# Patient Record
Sex: Female | Born: 1940 | Race: White | Hispanic: No | Marital: Married | Smoking: Never smoker
Health system: Southern US, Community
[De-identification: ages and names within clinical notes are randomized; demographics above are authoritative.]

## PROBLEM LIST (undated history)

## (undated) DIAGNOSIS — C50912 Malignant neoplasm of unspecified site of left female breast: Secondary | ICD-10-CM

## (undated) DIAGNOSIS — I10 Essential (primary) hypertension: Secondary | ICD-10-CM

## (undated) DIAGNOSIS — J42 Unspecified chronic bronchitis: Secondary | ICD-10-CM

## (undated) DIAGNOSIS — K219 Gastro-esophageal reflux disease without esophagitis: Secondary | ICD-10-CM

## (undated) DIAGNOSIS — Z8711 Personal history of peptic ulcer disease: Secondary | ICD-10-CM

## (undated) DIAGNOSIS — R519 Headache, unspecified: Secondary | ICD-10-CM

## (undated) DIAGNOSIS — Z8719 Personal history of other diseases of the digestive system: Secondary | ICD-10-CM

## (undated) DIAGNOSIS — R413 Other amnesia: Secondary | ICD-10-CM

## (undated) DIAGNOSIS — R51 Headache: Secondary | ICD-10-CM

## (undated) DIAGNOSIS — J449 Chronic obstructive pulmonary disease, unspecified: Secondary | ICD-10-CM

## (undated) DIAGNOSIS — M1990 Unspecified osteoarthritis, unspecified site: Secondary | ICD-10-CM

## (undated) DIAGNOSIS — G43909 Migraine, unspecified, not intractable, without status migrainosus: Secondary | ICD-10-CM

## (undated) DIAGNOSIS — J189 Pneumonia, unspecified organism: Secondary | ICD-10-CM

## (undated) DIAGNOSIS — E079 Disorder of thyroid, unspecified: Secondary | ICD-10-CM

## (undated) HISTORY — PX: DILATION AND CURETTAGE OF UTERUS: SHX78

## (undated) HISTORY — PX: WRIST SURGERY: SHX841

## (undated) HISTORY — PX: BREAST LUMPECTOMY: SHX2

## (undated) HISTORY — PX: BACK SURGERY: SHX140

## (undated) HISTORY — PX: FEMUR FRACTURE SURGERY: SHX633

## (undated) HISTORY — PX: ABDOMINAL HYSTERECTOMY: SHX81

## (undated) HISTORY — PX: APPENDECTOMY: SHX54

## (undated) HISTORY — PX: JOINT REPLACEMENT: SHX530

## (undated) HISTORY — PX: TOTAL HIP ARTHROPLASTY: SHX124

## (undated) HISTORY — PX: TONSILLECTOMY: SUR1361

---

## 1999-05-22 ENCOUNTER — Other Ambulatory Visit: Admission: RE | Admit: 1999-05-22 | Discharge: 1999-05-22 | Payer: Self-pay | Admitting: *Deleted

## 2000-06-08 ENCOUNTER — Encounter: Admission: RE | Admit: 2000-06-08 | Discharge: 2000-07-29 | Payer: Self-pay | Admitting: Anesthesiology

## 2000-07-17 HISTORY — PX: TOTAL KNEE ARTHROPLASTY: SHX125

## 2000-07-28 ENCOUNTER — Encounter: Payer: Self-pay | Admitting: Orthopedic Surgery

## 2000-07-29 ENCOUNTER — Inpatient Hospital Stay (HOSPITAL_COMMUNITY): Admission: RE | Admit: 2000-07-29 | Discharge: 2000-08-03 | Payer: Self-pay | Admitting: Orthopedic Surgery

## 2000-07-29 ENCOUNTER — Encounter: Payer: Self-pay | Admitting: Anesthesiology

## 2000-08-03 ENCOUNTER — Inpatient Hospital Stay (HOSPITAL_COMMUNITY)
Admission: RE | Admit: 2000-08-03 | Discharge: 2000-08-10 | Payer: Self-pay | Admitting: Physical Medicine & Rehabilitation

## 2001-05-16 HISTORY — PX: LUMBAR FUSION: SHX111

## 2001-05-20 ENCOUNTER — Encounter: Payer: Self-pay | Admitting: Neurosurgery

## 2001-05-20 ENCOUNTER — Inpatient Hospital Stay (HOSPITAL_COMMUNITY): Admission: RE | Admit: 2001-05-20 | Discharge: 2001-05-25 | Payer: Self-pay | Admitting: Neurosurgery

## 2001-05-22 ENCOUNTER — Encounter: Payer: Self-pay | Admitting: Neurosurgery

## 2001-06-22 ENCOUNTER — Ambulatory Visit (HOSPITAL_COMMUNITY): Admission: RE | Admit: 2001-06-22 | Discharge: 2001-06-22 | Payer: Self-pay | Admitting: Neurosurgery

## 2001-06-22 ENCOUNTER — Encounter: Payer: Self-pay | Admitting: Neurosurgery

## 2001-08-26 ENCOUNTER — Ambulatory Visit (HOSPITAL_COMMUNITY): Admission: RE | Admit: 2001-08-26 | Discharge: 2001-08-26 | Payer: Self-pay | Admitting: Neurosurgery

## 2001-08-26 ENCOUNTER — Encounter: Payer: Self-pay | Admitting: Neurosurgery

## 2001-09-16 HISTORY — PX: KNEE ARTHROSCOPY: SHX127

## 2002-10-18 ENCOUNTER — Encounter: Payer: Self-pay | Admitting: Orthopedic Surgery

## 2002-10-19 ENCOUNTER — Inpatient Hospital Stay (HOSPITAL_COMMUNITY): Admission: RE | Admit: 2002-10-19 | Discharge: 2002-10-23 | Payer: Self-pay | Admitting: Orthopedic Surgery

## 2002-10-19 ENCOUNTER — Encounter (INDEPENDENT_AMBULATORY_CARE_PROVIDER_SITE_OTHER): Payer: Self-pay | Admitting: *Deleted

## 2002-12-19 ENCOUNTER — Inpatient Hospital Stay (HOSPITAL_COMMUNITY): Admission: RE | Admit: 2002-12-19 | Discharge: 2002-12-23 | Payer: Self-pay | Admitting: Orthopedic Surgery

## 2002-12-19 ENCOUNTER — Encounter (INDEPENDENT_AMBULATORY_CARE_PROVIDER_SITE_OTHER): Payer: Self-pay

## 2002-12-19 HISTORY — PX: I&D KNEE WITH POLY EXCHANGE: SHX5024

## 2002-12-25 ENCOUNTER — Encounter: Payer: Self-pay | Admitting: Internal Medicine

## 2002-12-25 ENCOUNTER — Ambulatory Visit (HOSPITAL_COMMUNITY): Admission: RE | Admit: 2002-12-25 | Discharge: 2002-12-25 | Payer: Self-pay | Admitting: Internal Medicine

## 2003-04-09 ENCOUNTER — Encounter: Payer: Self-pay | Admitting: Orthopedic Surgery

## 2003-04-17 ENCOUNTER — Inpatient Hospital Stay (HOSPITAL_COMMUNITY): Admission: RE | Admit: 2003-04-17 | Discharge: 2003-04-21 | Payer: Self-pay | Admitting: Orthopedic Surgery

## 2003-04-17 ENCOUNTER — Encounter: Payer: Self-pay | Admitting: Orthopedic Surgery

## 2003-04-17 ENCOUNTER — Encounter (INDEPENDENT_AMBULATORY_CARE_PROVIDER_SITE_OTHER): Payer: Self-pay | Admitting: Specialist

## 2003-04-17 HISTORY — PX: TOTAL HIP REVISION: SHX763

## 2003-04-18 ENCOUNTER — Encounter: Payer: Self-pay | Admitting: Orthopedic Surgery

## 2003-08-17 HISTORY — PX: TOTAL KNEE REVISION: SHX996

## 2003-09-10 ENCOUNTER — Encounter: Payer: Self-pay | Admitting: Orthopedic Surgery

## 2003-09-11 ENCOUNTER — Encounter (INDEPENDENT_AMBULATORY_CARE_PROVIDER_SITE_OTHER): Payer: Self-pay | Admitting: *Deleted

## 2003-09-11 ENCOUNTER — Inpatient Hospital Stay (HOSPITAL_COMMUNITY): Admission: RE | Admit: 2003-09-11 | Discharge: 2003-09-15 | Payer: Self-pay | Admitting: Orthopedic Surgery

## 2003-12-18 ENCOUNTER — Other Ambulatory Visit: Admission: RE | Admit: 2003-12-18 | Discharge: 2003-12-18 | Payer: Self-pay | Admitting: Gynecology

## 2003-12-22 ENCOUNTER — Other Ambulatory Visit: Admission: RE | Admit: 2003-12-22 | Discharge: 2003-12-22 | Payer: Self-pay | Admitting: Gynecology

## 2004-10-21 ENCOUNTER — Encounter: Admission: RE | Admit: 2004-10-21 | Discharge: 2004-10-21 | Payer: Self-pay | Admitting: Internal Medicine

## 2004-11-16 HISTORY — PX: LAPAROSCOPIC CHOLECYSTECTOMY: SUR755

## 2004-11-27 ENCOUNTER — Encounter: Admission: RE | Admit: 2004-11-27 | Discharge: 2004-11-27 | Payer: Self-pay | Admitting: Internal Medicine

## 2004-12-03 ENCOUNTER — Encounter (INDEPENDENT_AMBULATORY_CARE_PROVIDER_SITE_OTHER): Payer: Self-pay | Admitting: *Deleted

## 2004-12-03 ENCOUNTER — Ambulatory Visit (HOSPITAL_COMMUNITY): Admission: RE | Admit: 2004-12-03 | Discharge: 2004-12-04 | Payer: Self-pay

## 2005-10-06 ENCOUNTER — Inpatient Hospital Stay (HOSPITAL_COMMUNITY): Admission: EM | Admit: 2005-10-06 | Discharge: 2005-10-10 | Payer: Self-pay | Admitting: Emergency Medicine

## 2006-02-26 ENCOUNTER — Other Ambulatory Visit: Admission: RE | Admit: 2006-02-26 | Discharge: 2006-02-26 | Payer: Self-pay | Admitting: Gynecology

## 2007-07-18 ENCOUNTER — Emergency Department (HOSPITAL_COMMUNITY): Admission: EM | Admit: 2007-07-18 | Discharge: 2007-07-18 | Payer: Self-pay | Admitting: *Deleted

## 2008-04-11 ENCOUNTER — Ambulatory Visit (HOSPITAL_COMMUNITY): Admission: RE | Admit: 2008-04-11 | Discharge: 2008-04-11 | Payer: Self-pay | Admitting: Gastroenterology

## 2009-06-21 ENCOUNTER — Encounter: Admission: RE | Admit: 2009-06-21 | Discharge: 2009-06-21 | Payer: Self-pay | Admitting: Neurosurgery

## 2011-04-03 NOTE — Procedures (Signed)
[facility]  Patient:    [name]                      MRN: [id number] Proc. Date: 06/17/00 Adm. Date:  [id number] Attending:  Thyra Breed CC:         Jearld Adjutant, M.D.   Procedure Report  PROCEDURE:  Lumbar epidural steroid injection.  DIAGNOSIS:  Lumbar spinal stenosis on the basis of lumbar spondylosis and spondylolisthesis.  INTERVAL HISTORY:  The patients noted some mild improvement from her first injection. She has been quite active and feels as though she may have out stripped the benefits from the increased activity she has been doing. She tolerated the 40 mg without untoward effects.  PHYSICAL EXAMINATION:  VITAL SIGNS:  Blood pressure was 186/84, heart rate 86, respiratory rate 16, O2 saturations 96%, pain level is 9/10 and temperature is 97.8.  BACK:  Shows good healing from previous injection site.  DESCRIPTION OF PROCEDURE:  After informed consent was obtained, the patient was placed in the sitting position and monitored. The patients back was prepped with Betadine x 3. A skin wheal was raised at the L5-S1 interspace with 1 percent lidocaine. A 20 gauge Tuohy needle was introduced to the lumbar epidural space to loss of resistance to preservative free normal saline. There was no cerebrospinal fluid nor blood. 80 mg of Medrol and 10 ml of preservative free normal saline was gently injected. The needle was flushed with preservative free normal saline and removed intact.  CONDITION POST PROCEDURE:  Stable.  DISCHARGE INSTRUCTIONS:  Resume previous diet. Limitations in activities per instruction sheet. Continue on current medications. Follow-up with me in 1-2 weeks for a third injection if she notes more significant improvement. DD:  06/17/00 TD:  06/19/00 Job: [id number]

## 2011-04-03 NOTE — Op Note (Signed)
NAME:  Breanna, Black                        ACCOUNT NO.:  [id number]   MEDICAL RECORD NO.:  [id number]                   PATIENT TYPE:  INP   LOCATION:  2550                                 FACILITY:  [facility]   PHYSICIAN:  Deidre Ala, M.D.                 DATE OF BIRTH:  08-28-1941   DATE OF PROCEDURE:  09/11/2003  DATE OF DISCHARGE:                                 OPERATIVE REPORT   PREOPERATIVE DIAGNOSIS:  Left knee status post  debridement and parts  removal for infection of  total knee arthroplasty with antibiotic cement  spacers, tobramycin infused placed.   POSTOPERATIVE DIAGNOSIS:  Left knee status post  debridement and parts  removal for infection of  total knee arthroplasty with antibiotic cement  spacers, tobramycin infused placed.   PROCEDURE:  Removal of cement spacers, left total knee and revision  total  knee arthroplasty using Sigma rotating platform DePuy system with Polycose  cement and tobramycin infused cement.   SURGEON:  Bradley Ferris, M.D.   ASSISTANT:  Madilyn Fireman, P.A.-C.   ANESTHESIA:  General endotracheal anesthesia.   SPECIMENS:  Cultures taken of fluid and tissue and frozen section showed no  evidence of polys per high power field of significance.   DRAINS:  Two medium Hemovacs to Autovac.   ESTIMATED BLOOD LOSS:  Less than 100 mL.   TOURNIQUET TIME:  1 hour and 49 minutes.   INDICATIONS FOR PROCEDURE:  [name] had bilateral  total knee arthroplasties  3 years ago. She then had a subsequent knee arthroscopy on the left due to  synovitis with a Baker's cyst excision. Due to persistent discomfort, she  had a revision arthroscopy due to tibial pain with an expanded stem tibia,  and then she developed a distal infection. At that point we tried to debride  it, but ultimately on April 17, 2003, took her back to the operating room  where we excised the previous total knee components. She had grown group B  Strep and we treated her with antibiotics  with a cement spacer using  tobramycin, and IV antibiotics postoperatively.   Ultimately the wound settled down, becoming non inflamed, so we elected to  work her up. By July 18, 2003, she was 92 days post implant. We got a  CBC, CRP and differential and by August 22, 2003, the white count was  67,000, 61% polys, 27% lymphs, normal shift, sed rate was 2, CRP was 0.3. We  aspirated the knee through a lateral  portal and all the cultures were  negative, therefore we elected to set her up with normal labs and normal  aspirate for reimplantation.   At surgery no untoward features were noted.  She is a little bit soft tissue  deficient on the retinaculum on the medial tibia because this was where her  original infection occurred. We were able to get soft tissue closure on  the  undersurface of the fascia medially over to the area of the tubercle. We  ended up using a size 2 revision tibial tray, size 2, 10-mm step wedges x2  and a 13 x 60 mm cemented stem tibia. We already had a cement plug distal  and we did not expand up there. We cemented the stem. We had a good tight  rotatory fit with this size component and cemented with Polycose cement  infused with tobramycin, 2 batches with 2 doses of 1.2 gm of tobramycin.   On the femur side we used a size 2 TC3 left femur, 12-mm medial distal  augment, an  8-mm lateral distal  augment, a 4-mm posterolateral augment and a plus/minus  2-mm bolt with an 18 x 125 mm femoral stem, size 2, 12.5 TC3 insert and a 35-  mm patellar button. We reimplanted all with Polycose cement infused with  tobramycin. We had full extension with flexion to 95 with varus/valgus  stability. We did use the total restraint rotating platform for added  stability and we did do a lateral  release.   DESCRIPTION OF PROCEDURE:  After adequate anesthesia had been obtained using  endotracheal technique, 1 gm of vancomycin had been given previously, the  patient was placed in  the supine position and the left lower extremity was  prepped from the  toes to the tourniquet in the standard fashion. After  standard prepping and draping Esmarch exsanguination was used. The  tourniquet was inflated up to 350 mmHg.   The old skin incision was then incised and the incision was deepened sharply  with a knife and hemostasis was achieved using the Bovie electrocoagulator.  We then made a median peripatellar retinacular incision and everted the  patella. The knee was flexed and we removed the femoral cement spacer, then  the  tibial cement spacer and some cement that was in the lateral femoral  condyle.   We then reigned down the canal on the tibia and because of a cement plug,  decided not to expand but to cement all the way down. We had had cement go  all the way down the canal and there was still a plug left on the left side.  We therefore sized to trial  13 x 60 mm, but we first made a freshened cut about 2 mm down and we had  good configuration of the tibial tray with the 10-mm wedges added in the  canal with  the appropriate alignment.   We then left that in place while we placed the distal femoral cutting jig in  place, made a minimal cut there. Then we made our anterior posterior cuts  for a 2 and then our box cuts. We then trialed the femur as above. We then  placed augments and felt them to be stable as above. We then freshened up  the posterior  patella and made some new holes to put in a new 35-mm button.  The parts were then removed for trialing. It fit first with a 10, but we  later went to a 12.5 on the final implantation with the total constraint  because we had better  stability and flexion in valgus.   We then removed all the trial components, thoroughly jet lavaged the knee  while cement was mixed. We first batched the tibial side and cemented the tibial revision tray in with the assembled wedges and impacted them. We  removed excess cement using the  Polycose cement.  We then cemented the  femoral component, not cementing the flutes of the stem. On the tibial side  we cemented all the way down to the base with hand packing, but on the  femoral side we press fit at the canal with the appropriate canal reaming to  endosteal bone had been done, but we cemented on the end, filling in some  gaps with the Polycose.  We then cemented the assembled femoral component with augments and impacted  it and removed excess cement.   We then trialed with the standard tibial rotating platform, but we liked the  constraint better at a 12.5, so implanted the 12.5 with stability flexion  extension with the constrained rotating platform. We got full extension with  flexion to 95. We also had cemented on the patellar component and impacted  it and removed excess cement.   We then thoroughly jet lavaged again. The tourniquet was let down and  bleeding points were cauterized. Hemovac drains were placed in the medial  and lateral portal and brought out the superolateral gutter. The wound was  then closed in layers with #1 Vicryl on the retinaculum. One Mitek anchor  was used to bring back the patellar tendon over toward the anatomic  tubercle. I also  did a lateral  release to balance the soft tissues. I then  continued to close with 0, 2-0 and 3-0 Vicryl and skin staples.   She was stable to flexion with the rotating platform with the retinaculum  closed over and the patella tracked well. A Hemovac was hooked up to  Autovac. A bulky sterile compressive dressing was applied with a knee  immobilizer.   The patient having tolerated the procedure well was awakened and taken to  the recovery room in satisfactory condition. She had had a femoral nerve  block placed for routine postoperative care and CPM and IV vancomycin in the  early postoperative period.                                               Deidre Ala, M.D.    VEP/MEDQ  D:  09/11/2003  T:   09/11/2003  Job:  [id number]

## 2011-04-03 NOTE — Discharge Summary (Signed)
NAME:  [name], [name]                        ACCOUNT NO.:  [id number]   MEDICAL RECORD NO.:  [id number]                   PATIENT TYPE:  INP   LOCATION:  5002                                 FACILITY:  [facility]   PHYSICIAN:  Deidre Ala, M.D.                 DATE OF BIRTH:  [birth year]   DATE OF ADMISSION:  04/17/2003  DATE OF DISCHARGE:  04/21/2003                                 DISCHARGE SUMMARY   ADMISSION DIAGNOSIS:  Infected left total knee arthroplasty.   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 It has been incised and  drained with poly change on one occasion. It continued to be inflamed and  drained. Decided to bring her to [facility] and remove the  prosthesis. This was done under general anesthesia on April 17, 2003.  Antibiotic impregnated cement spacers were molded based on her knee  components and fitted well to her. The knee was closed in a single layer  over drains. This was done under general anesthesia with a tourniquet time  of two hours and estimated blood loss of 250 cc, and she was taken to  recovery room in stable condition. Infectious disease was consulted on  postoperative day #1, April 18, 2003. She was complaining of knee pain and  anxiety. She had not been ambulating at all. She was out of bed in a chair.  Vitals stable with a maximum temperature of 101.8, down to 99.2 in the  morning. White count was 7.5, hemoglobin was 8.1-down from 9.4 preoperative,  hematocrit 23.5, and a platelet count of 236. Serum chemistries normal  except for a low potassium of 3.3, low calcium of 8.1. PT was 17.0 with an  INR of 1.4. Culture was ordered for infection of a PICC line. She was given  potassium chloride increased to  20 mEq twice daily. She was on Rocephin and  rifampin pending results of her cultures, non weight bearing on the left  leg, immobilizer at all times. She was transferred two units of packed red  blood cells with 20 mg of Lasix IV in between the units, and her Valium was  increased to 5 mg q.8h. p.r.n. for anxiety. On postoperative day #2, April 19, 2003, her pain was under control. Wound was benign. The dressing was  changed. She was neurovascularly intact. Her calf was soft. The drain was  left in place. Vitals were stable. She was afebrile. Moderate amount of  Hemovac drainage. Her hemoglobin and hematocrit post transfusion were 10.8  and 29. Cultures were still pending. She was on vancomycin and rifampin. On  April 20, 2003, postoperative day #3, she was without complaints.  She had  slept well the night before. Vital signs were stable with a maximum  temperature of 99.9. White cell count was 6.4, H and H were 10.5 and 30.8.  Serum chemistries normal except for a CO2 of 35, BUN low at 5. PT of 17.1  with an INR of 1.5. Gram stains were negative. Cultures were negative.  Anaerobes were negative x3 days. Her sed rate was elevated at 49, and  therapy was elevated at 48.1. Pathology showed inflamed synovium, chronic  pigmented histiocytes, giant cell foreign body reaction, cannot rule out  infection. Her PCA was discontinued. The PICC line was dropped down to a  saline well when not on IV. Her antibiotics were continued, vancomycin and  p.o. rifampin per infectious disease. Plan was made to take out the drain  the following day and over the weekend consider discharging her home.  Percocet was discontinued. She was given [facility] one to two q.3-4h.  for pain. She was on Coumadin per pharmacy protocol. On April 21, 2003, she  had no complaints. She was ready to go home. She was taking p.o. well,  voiding okay. Vitals were stable. She was afebrile. The left knee wound was  benign. The calf was  soft, neurovascularly intact distally.   The _______ was ______ and the drain was to be removed. Plan was made to  discharge her home.   ACTIVITY:  Her activities were non weight bearing on the left, immobilizer  at all times using a walker to get around.   WOUND CARE:  Wound care instructions were to keep it clean and dry, keep the  dressing on it.   DIET:  Her diet was regular as tolerated.   MEDICATIONS AT DISCHARGE:  1. Vancomycin, ceftriaxone IV, p.o. rifampin as per infectious disease for     six weeks.  2. Mepergan Fortis one to two q.3-4h. for pain.  3. Valium 5 mg t.i.d. for anxiety.  4. Restoril 7.5 q.h.s. for sleep.   FOLLOWUP INSTRUCTIONS:  Two weeks with Dr. Renae Fickle.   SPECIAL INSTRUCTIONS:  Call for increasing pain, increasing redness,  drainage, or discharge; worsening cough; shortness of breath; fever greater  than 100.5; or chills.   CONDITION ON DISCHARGE:  Stable.     Madilyn Fireman, P.A.-C.                       Deidre Ala, M.D.    AC/MEDQ  D:  05/13/2003  T:  05/14/2003  Job:  [id number]

## 2011-04-03 NOTE — Procedures (Signed)
[facility]  Patient:    [name], [name]                     MRN: [id number] Proc. Date: 06/24/00 Adm. Date:  [id number] Attending:  Thyra Breed CC:         Jearld Adjutant, M.D.   Procedure Report  PROCEDURE:  Lumbar epidural steroid injection.  DIAGNOSIS:  Lumbar spinal stenosis with underlying spondylolisthesis and spondylosis.  INTERVAL HISTORY:  The patient noted the second injection helped quite a bit compared to the first.  She is taking less pain medications.  She has been back to see Dr. Renae Fickle, who is still actively considering surgical intervention for her knee.  PHYSICAL EXAMINATION:  Blood pressure is 194/72.  Heart rate is 63. Respiratory rates 16.  O2 saturations 97%.  Temperature is 97.2.  Pain level is 6/10.   Her back shows good healing from previous injection site.  Neuro examination is grossly unchanged.  DESCRIPTION OF PROCEDURE:  After informed consent was obtained, the patient was placed in a sitting position and monitored.  Her back was prepped with Betadine x 3.  A skin wheal was raised at the L5-S1 interspace with 1% lidocaine.  A 20 gauge Tuohy needle was introduced in the lumbar epidural space with loss of resistance to preservative free normal saline.  There was no CSF nor blood.  Medrol 120 mg in 10 mL of preservative free normal saline was gently injected.  The needle was flushed with preservative free normal saline and removed intact.  Postprocedure condition - stable.  DISCHARGE INSTRUCTIONS: 1. Resume previous diet. 2. Limitations on activities per instruction sheet. 3. Continue on current medications. 4. The patient plans to follow-up with Dr. Renae Fickle. DD:  06/24/00 TD:  06/24/00 Job: [id number]
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
## 2011-04-03 NOTE — Discharge Summary (Signed)
[place]  Patient:    [name], [name]                     MRN: [id number] Adm. Date:  [id number] Disc. Date: 08/10/00 Attending:  Faith Rogue T Dictator:   Mcarthur Rossetti. Angiulli, P.A. CC:         Jearld Adjutant, M.D.  Feliciana Rossetti, M.D.   Discharge Summary  DISCHARGE DIAGNOSES: 1. 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 2. Postoperative pain management. 3. Anemia. 4. Hypertension. 5. Peptic ulcer disease. 6. Hypothyroidism.  HISTORY OF PRESENT ILLNESS:  A 70 year old white female admitted on July 29, 2000, with progressive bilateral knee pain.  X-rays with advanced degenerative joint disease.  No relief with conservative care. Underwent bilateral total knee replacement on July 29, 2000, per Jearld Adjutant, M.D.  Placed on Coumadin for deep venous thrombosis prophylaxis and weightbearing as tolerated.  Postoperative anemia of 7.7 and transfused.  No chest pain or shortness of breath.  Bouts of nausea and vomiting resolved with narcotic adjustment.  Required minimal assist for ambulation and moderate assist for bed mobility.  The latest INR was 1.9 and hemoglobin 9.6. Chemistries unremarkable.  Chest x-ray with no active disease.  Admitted for comprehensive rehabilitation program.  PAST MEDICAL HISTORY:  Hypertension, peptic ulcer disease, and hypothyroidism.  PAST SURGICAL HISTORY:  Hysterectomy and left lumpectomy.  ALLERGIES:  None.  SOCIAL HISTORY:  Occasional alcohol.  No tobacco.  Lives with husband in one-level home with two steps to entry.  Independent prior to admission.  Her husband works, but will take time off to provide assistance.  PRIMARY MEDICAL DOCTOR:  Feliciana Rossetti, M.D.  MEDICATIONS PRIOR TO ADMISSION: 1. Diovan. 2. Micardis 150 mg daily. 3. Synthroid 0.125 mg daily. 4. Zyrtec daily. 5. Prilosec 20 mg daily. 6. Urised daily.  HOSPITAL COURSE:  The patient did well while on  rehabilitation services with therapies initiated on a b.i.d. basis.  The following issues were following during the patients rehabilitation course:  Pertaining to Ms. Lintons bilateral total knee replacement, remained stable, surgical sites healing nicely, no signs of infection, pain management ongoing with good results, and neurovascular sensation remained intact.  She was ambulating independently in her room, weightbearing as tolerated.  Postoperative anemia was stable with latest hemoglobin of 10.9 and hematocrit 31.5.  She continued on Coumadin for deep venous thrombosis prophylaxis.  Her calves remained cool without swelling or erythema and nontender.  Feliciana Rossetti, M.D., was to be contacted prior to the patients departure from the hospital to follow blood thinner.  Blood pressures remained controlled with no orthostatic changes.  She had no bowel or bladder disturbances.  She did need some laxative assist early on for constipation.  She had a history of peptic ulcer disease.  She would continue with her Prilosec as prior to hospital admission.  Ongoing hormone supplement for her hypothyroidism.  Overall for her functional mobility, she was ambulating extended distances with a walker, essentially independent to standby assist in all areas of activities of daily living of dressing, grooming, and homemaking.  Overall her strength and endurance greatly improved.  She was encouraged with her overall progress and discharged home with home health therapies.  The latest labs on August 09, 2000, showed a hemoglobin of 10.9, hematocrit 31.5, WBC 6.9, platelets 449, sodium 141, potassium 5.0, BUN 7, and creatinine 0.6.  DISCHARGE MEDICATIONS:  1. Prilosec 20 mg daily.  2. Zyrtec daily.  3.  Urised as prior to hospital admission.  4. Synthroid 125 mcg daily.  5. Avapro 150 mg daily.  6. Trinsicon twice daily.  7. Ultram 50 mg four times daily as needed for pain.  8. Celebrex 200 mg  daily.  9. Coumadin daily with dose to be established at the time of discharge.     Complete Coumadin protocol for a total of four weeks from the day of     surgery.  She is now postoperative day #12.  She will need approximately     10-12 more days after discharge. 10. Xanax 0.125 mg twice daily as needed. 11. Tylenol as needed.  ACTIVITY:  Weightbearing as tolerated with walker.  DIET:  Regular.  WOUND CARE:  Cleanse incision daily with warm soap and water.  SPECIAL INSTRUCTIONS:  No driving.  No aspirin or ibuprofen while on Coumadin. Home health nurse as needed for prothrombin time.  FOLLOW-UP:  She is to be followed by Feliciana Rossetti, M.D. DD:  08/09/00 TD:  08/09/00 Job: [id number]

## 2011-04-03 NOTE — Discharge Summary (Signed)
NAME:  [name], [name]                        ACCOUNT NO.:  [id number]   MEDICAL RECORD NO.:  [id number]                   PATIENT TYPE:  INP   LOCATION:  5025                                 FACILITY:  [facility]   PHYSICIAN:  Deidre Ala, M.D.                 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   SUMMARY:  The patient was worked up in our office, had a left total knee  replacement and subsequent revision, noted an infection, and had her knee  hardware removed.  She had been worked up in our office.  Her infection had  been resolved. Cultures, blood work for signs of infection or inflammation  were all normal.  Plan was made to remove the cement spacers and do a total  knee revision.  She was admitted to [facility] on 11 September 2003, taken to  the operating room. Cement spacers were removed.  A long stem revision total  knee arthroplasty using Dupuy instruments was performed. This was done under  general anesthesia with tourniquet time of 1 hour 49 minutes, estimated  blood loss less than 100 ml.  Wound was closed over two medium drains to a  Hemovac, and she was taken to the recovery room in stable condition.   On postoperative day #1, 12 September 2003, she was complaining of an  appropriate level of pain, some nausea and itching the night before. She had  one episode of hypotension with PCA medicine which responded to increased  fluid.  Her vital signs were stable.  Blood pressure in the morning was  110/50, T-max 101.4, down to 101.1 in the morning.  Left knee dressing was  clean  and dry, calf soft and nontender.  She was neurovascularly intact.  Postop labs showed a hemoglobin of 10, hematocrit 28.6, down from 42.2  preop.  Serum chemistries showed an elevated glucose of 112 and a low  calcium of 8.2.  PT was 18.0 with an INR of 1.8, up from 1.1 preop.  We are  going to put her pain medications to be continued as ordered.  Benadryl 25  mg 1 to 2 q.8h. p.r.n. for itch plus adding cimetidine 400 mg 1 q.6h. p.r.n.  for itch as needed.  Continue her CPM.  PT, OT, and rehab were consulted.  She was out of bed to chair, ambulation was ambulating weightbearing as  tolerated on the knee.   On 13 September 2003, postoperative day #2, she was having a moderate amount  of pain and  pain after ambulation which was improved after rest.  Vital  signs were stable with maximum temperature of 102.0, down to 98.9 in the  morning.  Left knee incision was benign.  Moderate amount of ecchymosis,  minimal drainage, serosanguineous, no signs of infection.  Calf soft and  nontender.  Neurovascularly intact.  Hemoglobin 9.1, hematocrit 26.5, down  from 28.6.  PT 18.3, INR 1.8 after holding Coumadin for one day.  Serum  chemistries normal except for a low potassium of 3.3, elevated CO2 of 33,  elevated glucose of 111.  Foley was discontinued.  Coumadin and Lovenox per  protocol.  She resumed her preoperative Arthrotec 75 mg 1 b.i.d.  anticipating discharge home with home health PT in the next few days and  home CPM machine.   On 14 September 2003, postoperative day #3, she had a mild bit of knee pain,  moderate sinus pain and headaches, some hypotension with Percocet.  No real  problems with Dilaudid.  Her vital signs were stable; she was afebrile with  T-max of 99.3.  Left knee wound benign with minimal ecchymosis, no erythema.  Calf soft and nontender.  Neurovascularly intact.  Her IV was discontinue.  She was healing well.  Percocet was discontinued.  She was given:  1. Dilaudid 2 mg 1 to  3 p.o. q.3-4h. for pain.  2. Skelaxin 800 mg 1 q.6-8h. for pain.  3. Allegra 180 mg 1 b.i.d.  4. A flu shot was ordered while she was still inpatient prior to sending her     home.  5. Lovenox per protocol.  We are planning to send her home with Lovenox     prescription for 3 days if INR less than 1.8.  6. Coumadin per protocol.  7. Cephalexin 500 mg 1 p.o. t.i.d. for 7 days.  8. Vancomycin was discontinued.   On 15 September 2003, postoperative day #4, she had no complaints, taking p.o.  and voiding without difficulty.  T-max was 99.5.  Vital signs were stable.  Knee wound was benign.  Calf was soft.  PT 16.0, INR 1.4. She was discharged  home.   ACTIVITY:  Weightbearing as tolerated left leg with walker.  CPM 8 hours per  day at 0 to 40 degrees, increasing by 5 to 10 degrees per day as tolerated.   Home health PT and home health RN were ordered.   WOUND CARE:  Keep clean and dry.  May shower on postoperative day #6 and  staples out in 14 days.   DISCHARGE MEDICATIONS:  1. Coumadin per pharmacy protocol.  2. Dilaudid 2 mg 1 to 2 p.o. q.3-4h. p.r.n. for pain, #50.  3. Keflex 500 mg 1 t.i.d. for 7 day.  4. Robaxin 750 mg 1 q.6-8h. p.r.n. for spasm, #40 with 2 refills.   FOLLOW UP:  Dr. Renae Fickle in two weeks.   SPECIAL INSTRUCTIONS:  Call for increased pain, redness, drainage, or  bleeding, redness tracking proximally, numbness or tingling down the foot,  cough, shortness of breath, fever greater than 100.5, or chills.  Her  condition upon discharge was stable.      Madilyn Fireman, P.A.-C.                       Deidre Ala, M.D.    AC/MEDQ  D:  10/02/2003  T:  10/02/2003  Job:  [id number]

## 2011-04-03 NOTE — Op Note (Signed)
NAME:  [name], [name]                        ACCOUNT NO.:  [id number]   MEDICAL RECORD NO.:  [id number]                   PATIENT TYPE:  INP   LOCATION:  NA                                   FACILITY:  [facility]   PHYSICIAN:  Deidre Ala, M.D.                 DATE OF BIRTH:  1941-02-17   DATE OF PROCEDURE:  10/19/2002  DATE OF DISCHARGE:                                 OPERATIVE REPORT   PREOPERATIVE DIAGNOSES:  1. 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  2. Knee scope with popliteal cyst excision and synovectomy 11/02.   POSTOPERATIVE DIAGNOSES:  1. Micro-loosening, left tibia.  2. Marked inflammatory synovitic reaction.  3. No sign of infection by frozen section.  4. Marked osteolysis, femoral condyle posteriorly, lateral, and     anteromedial.  5. Mildly scored posterior patellar polyethylene.   PROCEDURES:  1. Left knee revision tibial component to long-stem tray, canal filling.  2. Extensive synovectomy.  3. Revision patellar poly button.  4. Debridement of femoral condyle osteolysis posterior to the prosthesis and     anteromedial, with cement placement.  5. Use of DePuy components.   SURGEON:  Bradley Ferris, M.D.   ASSISTANT:  Madilyn Fireman, P.A.-C.   ANESTHESIA:  General endotracheal.   CULTURES:  Cultures taken of tissue.   DRAINS:  Two medium Hemovacs to Autovac.   ESTIMATED BLOOD LOSS:  200 cc.   BLOOD REPLACED:  Without.   TOURNIQUET TIME:  1 hour 36 minutes.   PATHOLOGIC FINDINGS AND HISTORY:  The patient is a 70 year old female who  underwent bilateral total knee arthroplasty 09/28/00.  I did the right side  and Harvie Junior, M.D., assisted.  Dr. Luiz Blare did the left side, I  assisted.  She has had no problems with the right knee.  The left knee, she  has had pain and swelling since inception despite normal-appearing x-rays  except for some lucency on the tibial-cement interface.  She developed a  popliteal cyst about  a year out with synovitis.  We scoped her, debrided the  knee, and excised the popliteal cyst where there was significant synovitis  in the posterolateral corner.  This then improved her symptoms for some  time, but she continued to have pain.  Bone scan prior to this indicated  possibly some mild loosening of the tibial component, but it was very  minimal.  we repeated the bone scan recently, and it was more active.  We  aspirated her knee.  There were no significant white cells and no cultures  of any organisms or growth of any organisms.  There was no purulence.  Her C-  reactive protein was elevated.  Her sedimentation rate was within normal  limits the first time we checked it before her knee arthroscopy.  We  therefore felt she was not likely to be infected and that she had  micro-  loosening.  We therefore elected to proceed with debridement, replacement to  a long-stem revision tibia, canal filling, checking the femur, exchange the  poly patellar button, and thoroughly debride any synovitis.  At surgery we  found some discolored hemosiderin-like synovitis throughout, very  proliferative, and especially along the posterior femoral condyles and in  the joint lines.  This was sent for pathology, and it showed frozen section  less than 5 neutrophils per high-power field, no sign of pus.  We also sent  Gram stains.  It did not look purulent, it looked inflammatory reactive to  metal and plastic.  The poly on the tibia was a 10, and it did not appear to  be significantly worn.  She was in some slight recurvatum in the operating  room under anesthesia, as was the other side.  Range of motion pre- and  postop was 0-120 with a little recurvatum bilaterally.  Ultimately we cut  her down about 7 mm on the tibial side, which had micro-loosening.  The  femur was not loose but had a defect measuring about a centimeter posterior  to the posterior femoral condylar runner that we excised where the  bone had  dissolved with osteolysis and along the anterior medial femoral condyle,  where we excised the bone and synovitic reaction.  However, we did not  choose to divide the femur.  It was otherwise rock-solid.  The patellar poly  was slightly scored.  This was a two-part poly button, metal-backed DePuy,  and it was standard plus, and we removed it and replaced it with a new poly  button.  We achieved ligament balance in flexion, not able to get anything  more in than a 17.5 after we built up 10 with wedges and in extension she  had the slight recurvatum which she came in with.  We did not feel we could  go any higher in flexion with the 17.5, having used the 10 mm wedges.  We  got appropriate rotation, appropriate slight valgus position of the tibial  component down the canal and canal filling to a 14 mm, which effected a leg  valgus.  We used a size 2+5 revision tibia, a size 2+5 x10 step wedges, 14  mm x 75 tibial stem, a standard plus 17 mm insert, a standard plus patellar  replacement, and we used bone cement with gentamicin, two batches.   DESCRIPTION OF PROCEDURE:  With adequate anesthesia obtained using  endotracheal technique, 1 g Ancef given IV prophylaxis and another one at  tourniquet let-down, the patient was placed in the supine position, Foley  placed.  The left lower extremity was prepped from the toes to the  tourniquet in standard fashion.  After standard prepping and draping,  Esmarch exsanguination was used and the tourniquet was let up to 350 mmHg.  The old median parapatellar skin incision was then excised, followed by a  median parapatellar retinacular incision.  The incision was deepened sharply  with a knife and hemostasis obtained using the Bovie electrocoagulator.  We  then everted the patella.  We then completed an extensive synovectomy  throughout the prosthesis, flexed the knee, took out synovitis in the notch and at the posterior runners of the condyles.   We then amputated the tibial  stem and the rotating platform, removed the tibial tray, and then checked  the tibial side and found it to be micro-loose, used an osteotome to chip it  up without significant bone loss.  There was some cement in the metaphyseal  area that pulled some cancellous bone out.  There was cortical bone all the  way around probably 90%, and we then reamed up to a 14, placed the tibial  cutting jig in place.  The long alignment guide was just medial to the  tibial crest.  We set it at a level we thought we would get a 0 degree cut  that wound include all the ridges, and we made this cut.  We then cleaned  out the canal with a curette where there was fibrinous hemosiderin exudate  and synovitis and got to fresh bone and removed all the cement.  We then  placed the conical reamer down in.  We then placed the trial tibia and used  the 17.5, thinking we only cut about 7.5, and there was very much recurvatum  so we went up with wedges of 5 mm.  That still was not enough, so we went up  to 10 mm on the trial, then used the 17.5 insert and felt we had full  extension and flexion but we could not go up any more in flexion than 17.5,  leaving [place] with slight recurvatum, which is what she came in with anyway.  Effectively we probably cut her 7.5, but we added to the construct 5 + 10,  so 15.  In any case, we also debrided the posterolateral femoral condyle and  the anterior medial femoral condyle and packed cement there, and we  exchanged the poly button.  We then in sequence of all this jet-lavaged the  defect after doing the appropriate trials in the tibia, and we placed the  keel punch, and this was before doing the trial.  We then removed all the  trial components and, satisfied with the 10 wedges and the 17.5 insert, we  then thoroughly jet-lavaged the knee while we checked the component size  coming on the table and mixed cement, two batches of cement with DePuy One  cement  and gentamicin in each.  I then assembled the tibial component on the  table with the wedges and the distal canal-filling stem, 14 mm.  We then  placed cement down the tibia hole and compressed it down and then impacted  it, impacting the tibial component stem down the canal in the prearranged  rotation until totally flush.  We had trimmed the posterior tibial cortical  rim to get an absolutely flush fit in the end.  We then cemented the defect  in the posterior lateral femoral condyle underneath the runner of the  prosthesis and anteromedially and held the cement until it hardened.  The  patellar poly button was exchanged on the patella, and then thorough  additional lavage was carried out.  When the cement had cured, the  tourniquet was let down, bleeding points were cauterized.  Hemovac drains  were placed in the medial and lateral gutters and brought out the superior  lateral portal.  The wound was then closed in layers with #1 Vicryl on the retinaculum, 0 and 2-0 Vicryl on the subcu, and skin staples.  A bulky  sterile compressive dressing was applied with knee immobilizer.  The patient  then, having tolerated the procedure well, was awakened and taken to the  recovery room in satisfactory condition to be admitted for routine  postoperative care, CPM, analgesia, femoral nerve block, and antibiotics.  Deidre Ala, M.D.    VEP/MEDQ  D:  10/19/2002  T:  10/20/2002  Job:  [id number]

## 2011-04-03 NOTE — Discharge Summary (Signed)
NAME:  [name], [name]                        ACCOUNT NO.:  [id number]   MEDICAL RECORD NO.:  [id number]                   PATIENT TYPE:  INP   LOCATION:  5040                                 FACILITY:  [facility]   PHYSICIAN:  Deidre Ala, M.D.                 DATE OF BIRTH:  [birth year]   DATE OF ADMISSION:  12/19/2002  DATE OF DISCHARGE:  12/23/2002                                 DISCHARGE SUMMARY   ADMISSION DIAGNOSIS:  Drainage from left knee, status post revision of total  knee arthroplasty with suspected superficial infection of surgical wounds.   DISCHARGE DIAGNOSIS:  Infected left total knee arthroplasty, status post  revision.   HISTORY OF PRESENT ILLNESS:  The patient was status post a revision of her  left total knee on October 19, 2002.  She was seen in our office for a  hematoma and suspected superficial infection.  Cultures were obtained.  Gram  stain showed gram positive cocci.   HOSPITAL COURSE:  On December 18, 2002, decision was made to admit her for an  irrigation and debridement of the superficial portion of the wounds that  appeared infected.  On December 19, 2002, she was taken to the operating  room.  An incision was carried through the superficial portion of the wound  and a track was followed.  The infection ran down to the prosthesis.  The  total knee was opened up and washed out with granulation of inflammatory  type tissue synovitic reaction was debrided.  The wound was copiously  irrigated with saline and then antibiotic irrigation and the wound was  closed with single layer vertical mattress retention sutures and then the  superficial skin was stapled closed.  This was done with tourniquet time of  42 minutes.  The wound was closed over two medium Hemovac drains and an  estimated blood loss of 150 cc.  She was taken to the recovery room in  stable condition.   On December 20, 2002, postoperative day #1, she was having some pain.  She  was locked out on  her PCA and had severe urgent reaction to Percocet.  Her  vitals were stable though she had spiked a temperature of 101.6 early in the  morning.  The dressing was clean, intact and dry with no active drainage.  Calf was soft and nontender.  Her white count was 9.2 with hemoglobin and  hematocrit of 9.5 and 29.2.  Chemistries were unremarkable except for a low  potassium of 3.3 and low calcium of 8.3.  Percocet was changed to [employer] one to two p.o. q.3h.  The PCA was increased to full dose.   On postoperative day #2, December 21, 2002, she was still having pain but was  controlled with Mepergan.  The PCA was discontinued.  Vitals were stable  with maximum temperature of 100.1.  The dressing was changed.  There  was  some dried blood on the retention sutures, some mild erythema but no  ecchymosis, purulence or sign of infection.  Calf was still soft.  Approximately 130 mL came out of the drain before and about 25 mL came out  the morning and it was decided to leave the drain in.  Continue to follow  with cultures.  Infectious disease was consulted.  She was on Keflex from  December 15, 2002, and was changed to Doxycycline and Rifampin.  This was  prior to her admission.  On admission, she was put on Zosyn pending the  culture results.   On postoperative day #3, December 22, 2002, she was having a little  congestion and shortness of breath, some diarrhea and moderate knee pain but  it was well controlled.  Vitals were stable.  She was afebrile.  The wound  was benign with very minimal drainage.  The drain was discontinued.  Her  primary care physician, Dr. Quintella Reichert, was consulted regarding treatment for  her congestion.  He prescribed Entex LA one b.i.d.  The decision was made  that pending the results of the ID consult whether she needed a PICC line or  put her on p.o. antibiotics would determine when she was discharged.  Infectious disease saw her and they were recommending three weeks  of IV  therapy and then starting Rifampin and Tequin for a month following that.   On December 23, 2002, she was ready to go home but the PICC line had not been  placed.  The wound was benign.  She was afebrile.  She was stable and she  was cleared for discharge.  She could be arranged for the PICC line to be  placed on Monday, send her home with saline well IV.  This was discussed.  Her home health follow-up was arranged by care management and she was  discharged home.   DISCHARGE MEDICATIONS:  1. Rifampin 600 mg one p.o. daily.  2. Rocephin 1 g IV daily x3 weeks.  3. Mepergan Fortis two p.o. q.4h. p.r.n. for pain.   ACTIVITY:  Weightbearing as tolerated on the left knee with the mobilizer  on.   DISCHARGE INSTRUCTIONS:  She was instructed to call if her pain was not  controlled.  She was instructed to drink plenty of water, keep track of  loose bowel movements and notify doctor if bloody or mucous stools were  noted.   DIET:  Without restrictions.   WOUND CARE:  She will have [facility].  She will prefer home  antibiotics and get dressing changes as well.   FOLLOW UP:  She was to set up for the PICC line placement on Monday, going  to outpatient clinic at 10 a.m.  She was instructed to call Monday or  Tuesday and make an appointment to see Dr. Renae Fickle and to follow up with Dr.  Orvan Falconer from infectious disease.   CONDITION ON DISCHARGE:  Stable.     [name], [name].                       Deidre Ala, M.D.    AC/MEDQ  D:  01/09/2003  T:  01/09/2003  Job:  [id number]

## 2011-04-03 NOTE — Op Note (Signed)
[facility]  Patient:    [name], [name]                     MRN: [id number] Proc. Date: 07/29/00 Adm. Date:  [id number] Attending:  Drema Pry                           Operative Report  PREOPERATIVE DIAGNOSIS:  Left knee degenerative arthritis.  POSTOPERATIVE DIAGNOSIS:  Left knee degenerative arthritis.  PROCEDURE:  Left total knee replacement.  SURGEON:  Lubertha Basque. Jerl Santos, M.D.  ASSISTANT:  Jearld Adjutant, M.D.  ANESTHESIA:  General.  INDICATIONS:  The patient is a 70 year old woman who has failed conservative measures for bilateral degenerative arthritis of the knee.  At this point she is offered total knee replacement on both sides.  The dictated procedure followed a right total knee replacement performed by Dr. Renae Fickle and assisted on by myself.  Informed operative consent was obtained prior to this procedure after discussion of the possible complications of reaction to anesthesia, infection, DVT, PE and death.  DESCRIPTION OF PROCEDURE:  The patient was taken to the operating suite. General anesthetic was applied without difficulty and she was positioned in a supine position.  She was  prepped and draped in a normal sterile fashion. All appropriate anti-infective measures were used including preoperative IV antibiotic, Betadine impregnated drapes and closed hooded exhaust systems for each member of the surgical team.  She was prepped and draped in normal sterile fashion and a right total knee replacement was performed.  Once the tourniquet was deflated on this side the left leg was elevated, exsanguinated, and a tourniquet inflated about the thigh.  A longitudinal anterior incision was made with dissection down to the extensor mechanism.  A medial parapatellar incision was made through this structure.  The knee cap was flipped and the knee flexed up to 120 degrees.  She had severe degenerative arthritis of all 3 compartments  with some lose bodies as well.  She had fair bone quality.  Some residual residual tissues along with a portion of the PCL were removed.  It did not appear as though she had an ACL and she had a very stenotic notch.  An extramedullary guide was then placed on the tibia and a cut made on the tibia parallel with the floor. An intermedullary femoral guide was placed to create anterior and posterior cuts on that bone to create a flexion gap at 10 mm.  A second intermedullary guide was placed in the femur to make a distal femoral cut creating a 10 degree equal extension gap.   The femur sized to a standard plus component and the appropriate chamfer guide was applied and used.  The tibia sized to a standard plus component as well and the appropriate guide was placed and used. A trial reduction was performed with this components and a 10 mm deep disheveled spacer.  She gained full extension and slight hyperextension with good ligamentous stability.  The patella tracked well and no lateral release was required.  The patella was then addressed and taken from a thickness of 22 mm to 13 mm with cutting guide.  Three drill holes were placed with the appropriate guide. All trial components were removed.  Pulsatile lavage was used to irrigate all cut bony surfaces.  Cement was mixed including the antibiotic and this was then pressurized in all three cut bony surfaces.  The  aforementioned Depuy LCS standard plus components were placed at the femur, tibia, and patella. Pressure was held on cement until the cement had hardened and excess cement was trimmed.  The 10 mm deep dish spacer was placed during the process.  The knee then ranged fully once again, and easily came to full extension.  The patella tracked well.  The tourniquet was deflated.  The knee was thoroughly irrigated with pulsatile lavage.  A drain was placed exiting superolaterally. The extensor mechanism was then reapproximated with 1 Vicryl  in interrupted fashion.  Once this repair was completed the knee flexed to 120 degrees without difficulty.  Subcutaneous tissues were reapproximated with 0 and 2-0 undyed Vicryl.  Skin was closed with staples. Adaptic was placed on the wound followed by dry gauze and a lose Ace wrap.  ESTIMATED BLOOD LOSS AND OPERATIVE FLUIDS:  Obtain from the anesthesia records.  DISPOSITION:  The patient was extubated in the operating room.  She was taken to the recovery room and eventually admitted to the orthopedic surgery service of Dr. Renae Fickle for appropriate postoperative care. DD:  07/29/00 TD:  07/31/00 Job: [id number]

## 2011-04-03 NOTE — Op Note (Signed)
NAME:  [name], [name]                        ACCOUNT NO.:  [id number]   MEDICAL RECORD NO.:  [id number]                   PATIENT TYPE:  INP   LOCATION:  5040                                 FACILITY:  [facility]   PHYSICIAN:  Deidre Ala, M.D.                 DATE OF BIRTH:  01/13/1941   DATE OF PROCEDURE:  12/19/2002  DATE OF DISCHARGE:                                 OPERATIVE REPORT   PREOPERATIVE DIAGNOSIS:  Superficial distal wound infection, not virulent,  right total knee revision at two months, subacute.   POSTOPERATIVE DIAGNOSIS:  Infection down to the knee with deep involvement.   PROCEDURE:  Right total knee aggressive debridement, irrigation, and  polyethylene exchange.   SURGEON:  Bradley Ferris, M.D.   ASSISTANT:  Madilyn Fireman, P.A.-C.   ANESTHESIA:  General endotracheal.   CULTURES:  Cultures taken of tissue and wound, aerobic and anaerobic.   DRAINS:  Two medium Hemovacs.   ESTIMATED BLOOD LOSS:  150 mL.   BLOOD REPLACED:  Without.   PATHOLOGIC FINDINGS AND HISTORY:  The patient two months ago, 10/19/02, had a  tibial revision of her knee with a long stem prosthesis and cement with two  10 mm step wedges and a 17.5 mm insert.  This was a rotating poly, LCS type.  We also did poly exchange on the posterior patella.  She did have marked  synovitis and had some lysis of her bone in the posterolateral femoral  condyle and medial femoral condyle that we debrided with synovium and packed  with cement.  In any case, initially postoperatively she did well.  Eleven  days postop, however, she had some knee drainage.  We got cultures, and we  put her on Zyvox to protect against MRSA.  She then was seen on 11/13/02.  She 25 days postop grew group B strep, which was probably a contaminant and  was sensitive to everything.  We then followed her along and no sign of  infection at that point, and she felt a pop on the medial aspect of her  tibial tubercle 12/06/02 at 49  days postop.  We did not see any problems with  x-rays.  She came back in 12/15/02 with a 2.5 cm area, raised, with  fluctuance, erythematous.  We thought it was a stitch abscess and a  hematoma.  We aspirated 20 mL of bloody fluid, sent it for cultures, and  then further opened the area and drained a hematoma and irrigated it.  We  put her on Keflex, put an iodoform wick in.  Her cultures came back no  growth, still today no growth.  At five days there were rare gram-positive  cocci in pairs.  She came back in with more drainage yesterday and even  though she was afebrile and her prosthesis was not tender, we felt it  important to treat this aggressively even if  superficial.  When we opened  her today, there was still some pussy drainage but it tracked down through  the anterior prosthesis.  There was some granulation tissue in the joint but  no gross pus.  However, we treated it as a deep wound contamination,  probably with group B strep, and we thoroughly aggressively debrided the  entire wound, jet-lavaged with 6 L of normal saline, did a poly exchange,  and actually put a 20 mm tibial tray in because she had a little bit of  hyperextension and we liked the 20 mm better.  We used 1 L of triple  antibiotic solution and consulted infectious disease, which felt it was  indeed probably group B strep and that we should use Zosyn, which we already  planned to use when the tourniquet let down.  Cultures were obtained of  tissue as well as the wound again, and ID will follow up on this.  We never  grew staph, never grew methicillin-resistant Staphylococcus aureus.   DESCRIPTION OF PROCEDURE:  With adequate anesthesia obtained using  endotracheal technique, the patient was placed in the supine position, the  left lower extremity was prepped and draped in a standard fashion.  We  opened the inferior wound and when we palpated it tracking down to the  prosthesis, we opened the entire wound medial  retinacular through the old  incision.  The old drainage site was excised.  We then everted the patella,  flexed the knee, and thoroughly debrided all soft tissue that looked like it  may have infected granulation tissue.  We then removed the poly.  We then  thoroughly jet-lavaged with 6 L of normal saline pulsatile lavage and 1000  mL of triple antibiotic solution.  We then exchanged the poly to a larger  rotating platform standard plus, and a standard plus rotating patella.  I  clicked those on.  We then let the tourniquet down.  We had talked to  infectious disease, Cliffton Asters, M.D., over the phone.  We then let the  tourniquet down.  Bleeding points were cauterized.  Hemovac drains placed in  the medial and lateral portals and brought out the superior lateral portal.  The wound then had a single-layer closure with a #1 vertical mattress suture  of nylon interposed with a few staples.  Red Roxan Hockey bolsters were used to  protect the skin underneath the mattress suture.  A bulky sterile  compressive dressing was applied with Ace, and a knee immobilizer to be  placed, not on CPM.  The patient, having tolerated the procedure well, had  Zosyn given as the tourniquet was let down, was taken to the recovery room  in satisfactory condition for routine postoperative care.  She did not want  the use of an epidural or femoral nerve block, and so we will go with the  morphine PCA.  Infectious disease will follow up.                                               Deidre Ala, M.D.    VEP/MEDQ  D:  12/19/2002  T:  12/20/2002  Job:  [id number]   cc:   Cliffton Asters, M.D.  [address] [place]  [place] [address]  Fax: [phone]

## 2011-04-03 NOTE — Op Note (Signed)
NAME:  [name], [name]                        ACCOUNT NO.:  [id number]   MEDICAL RECORD NO.:  [id number]                   PATIENT TYPE:  INP   LOCATION:  NA                                   FACILITY:  [facility]   PHYSICIAN:  Deidre Ala, M.D.                 DATE OF BIRTH:  05/15/1941   DATE OF PROCEDURE:  04/17/2003  DATE OF DISCHARGE:                                 OPERATIVE REPORT   PREOPERATIVE DIAGNOSIS:  Infection, left total hip arthroplasty.   POSTOPERATIVE DIAGNOSIS:  Infection, left total hip arthroplasty.   PROCEDURE:  Removal of cemented LCS-type DePuy prosthesis, femur and tibia,  long stem, revision stem, with placement of antibiotic cement spacers.   SURGEON:  Bradley Ferris, M.D.   ASSISTANT:  Luretha Rued, P.A.-C.   ANESTHESIA:  General endotracheal with femoral nerve block postoperatively.   CULTURES:  Copious.   DRAINS:  Two medium Hemovacs to self suction.   ESTIMATED BLOOD LOSS:  250 mL.   REPLACEMENT:  Without.   TOURNIQUET TIME:  Two hours.   PATHOLOGY/HISTORY:  The patient is a 70 year old female who about three  years ago had bilateral total knee arthroplasties.  I did the right and Dr.  Lubertha Basque. Dalldorf did the left.  She began to have continued pain in the  left knee about two years postoperatively and was taken back to the  operating room where she was scoped, washed out, and a large Baker's cyst  removed in the back.  No sign of infection.  There was some material that  looked as if it might be metalosis.  She then continued to have pain in the  left knee and was ultimately worked up with a positive bone scan for tibial  loosening, and some edema in the distal femur, and so we took her to the  operating room on October 19, 2002, where she had a revision tibial stem  placed with a 14 mm x 75 mm tibial stem, standard plus 17.5 mm insert, and  DuPuy bone cement with gentamicin two batches, standard plus patellar  replacement.  There was some  lytic area behind the lateral femoral condyle  which we packed with antibiotic cement.  She then had some initial drainage,  but never grew anything.  The only thing we ever came with up in any of this  was a group-B Streptococcus.  We found that she had a hematoma.  Then on  December 15, 2002, increasing drainage.  On December 19, 2002, we took her  back to the operating room.  The wound tracked down to the prosthesis.  We  opened the entire knee joint.  No gross purulence in the knee, but  granulation tissue.  Aggressive debridement and poly-exsanguination was  carried out, since it was probably a group-B Streptococcus and not  Staphylococcus.  We put her on Zosyn and a single layer  closure.  Initially  she cleared with a PIC line, giving her Ancef and later oral Rifampin and  amoxicillin and some Flagyl.  The infection resolved by February 26, 2003.  She  had been off of antibiotics for five days with a lot less drainage and a lot  less pain.  Then the hematoma appeared to be resolved by March 02, 2003.  We  kept a close eye on her.  She was dramatically improved by March 07, 2003,  and on Mar 28, 2003, she came back in and she was beginning to have fluid  accumulation again.  We were starting her on Rifampin and amoxicillin.  X-  rays showed a radiolucent area medially on the bone cement interface of the  tibia.  Then on Apr 04, 2003, she came back in and it looked as if infection  was recurring, so we took her off of antibiotics to get a more pure culture  and brought her back to the operating room today.  Today the whole knee had fluid, granulation tissue, and there were several  areas where there was a blackened discoloration through holes in the bone  anterior to the tibia, as if she were draining from deep, and almost as if  there were a metalosis reaction.  We sent copious tissue and swallows for  cultures and Gram's stain and frozen, which showed polyps, but no organisms,  and was  consistent with infection, so all of this was cultured again.  We  thoroughly debrided everything and took off the femur, which took a moderate  amount of bone with it and cement, including the cement we had packed around  the femoral condyles.  We also took out all the cement except the very  distal plug down the canal on the tibia and remembered that did have  gentamicin in it.  We took all the components out and then fashioned cement  spacers out of [place] cement, a total of six batches, actually two batches  of three packs each, and each pack had three doses of tobramycin and three  doses of Zinacef mixed together in the cement.  The wound was somewhat  difficult to close, but we did trim on the cement spacers, making a separate  tibial and femoral one, none on the patellar side, and we were able to get  the wound closed.  There is an area where there is just defect in the  retinaculum where this infection was going on the medial tibial plateau, and  we just basically left that open, closing the skin in a single layer closure  over top, and the retinaculum proximal with #1 Vicryl.  We will keep her, and infectious disease with Dr. Tinnie Gens C. Hatcher, today  on Rocephin and Rifampin.   DESCRIPTION OF PROCEDURE:  With adequate anesthesia obtained, using  endotracheal technique, 1 g of Ancef had already been given IV prophylaxis  and another g of Rocephin was given at closure.  The tourniquet was let  down.  The patient was placed in the supine position.  The left lower  extremity was prepped from the toes to the tourniquet in the standard  fashion.  After the standard prepping and draping the Esmarch exsanguination  was used.  The tourniquet was let up to 350 mmHg.  We had elongated the  incision and excised the sinus cavity to the medial tibial plateau in the  wound, and dissected down through a medial retinacular incision and then thoroughly debrided  all the granulation tissue, the  discolored tissue  completely.  We then removed the patella and the patellar button and the  metal, and used a drill and curet to remove the cement.  We then used  [place] cement tools to loosen the femoral component and removed it, as  well as the adjacent cement.  I then used the [facility] instruments  underneath the tibial tray, loosened it, and then put in the DuPuy extractor  device, impacted up on that and then used a __________  disimpacter and got  the prosthesis out, including the stem.  C-arm was brought in and we drilled  down the distal canal, tried to get the very distal plug in with a screw-in  device to slap it out, but it really would not hold.  We felt this cement  did have gentamicin in it, and was far distal and would not be a problem.  The canal was then thoroughly cleaned with rongeurs, osteotomes, and curets  of cement.  A thorough jet lavage was then carried out throughout the knee,  as we mixed the cement on the side, and made our spacers.  The spacers were  a bit big to close the wound just in circumference, so we cut them down with  osteotomes and did hen put them in place, a femoral and a tibial component  with stem, leaving the patella alone.  The tourniquet was let down.  Bleeding points were cauterized.  Hemovac drains placed in the medial and  lateral gutters and brought out the superior lateral portal.  Then #1 PDS  was then used to close the retinaculum, #2 Ethilon sutures were then used  with vertical mattress sutures up and down for retention along the knee, and  then we interspersed with staples.  A bulky sterile compressive dressing was  applied with an Ace, and a long knee immobilizer placed.  Anesthesia was  completed with femoral nerve block.   The patient, having tolerated the procedure, was taken to the recovery room  in satisfactory condition for routine postoperative care, IV antibiotics,  infectious disease consultation, and non-weightbearing.   Intraoperative fluoroscopy was used to check the cement plug distally.                                                Deidre Ala, M.D.    VEP/MEDQ  D:  04/17/2003  T:  04/17/2003  Job:  [id number]   cc:   Lacretia Leigh. Ninetta Lights, M.D.  [address]  [place]  [place] [address]  Fax: [phone]   Cliffton Asters, M.D.

## 2011-04-03 NOTE — Discharge Summary (Signed)
[place]. [facility]  Patient:    [name], [name]                     MRN: [id number] Adm. Date:  [id number] Disc. Date: [id number] Attending:  Faith Rogue T Dictator:   Vilinda Blanks. Moye, P.A.C.                           Discharge Summary  DIAGNOSIS:  Bilateral knee degenerative joint disease, end-stage.  PROCEDURES:  Bilateral total knee replacements on July 29, 2000, by Drs. Jearld Adjutant and Marcene Corning.  DISCHARGE MEDICATIONS:  1. Coumadin per pharmacy protocol.  2. Xanax 0.25 mg p.o. t.i.d. p.r.n. anxiety.  3. Colace 100 mg p.o. b.i.d.  4. Skelaxin 400 mg one or two p.o. q.6-8h. p.r.n. spasm.  5. Protonix 40 mg p.o. q.d.  6. Claritin 10 mg p.o. q.d.  7. Urised two p.o. q.d.  8. Synthroid 0.125 mg p.o. q.d.  9. Avapro 150 mg p.o. q.d. 10. Vicodin 5/500 one or two p.o. q.4h. p.r.n. pain.  DISPOSITION:  The patient was discharged and transferred to the physical medicine rehabilitation service for complete inpatient rehabilitation prior to discharge home.  HOSPITAL COURSE:  This is a 70 year old white female with approximately a 10 year history of bilateral knee pain.  She gave a history of pain with each step, pain at night, and inability to walk a city block.  On exam prior to surgery she had bilateral knee pain and tenderness along the joint lines with 2+ effusion bilaterally.  X-ray findings showed bone-on-bone knee degenerative joint disease bilaterally.  She was admitted and underwent bilateral total knee replacement on July 29, 2000, by Drs. Renae Fickle and [place].  She tolerated the procedure well.  Postoperatively, she was placed on Coumadin therapy per pharmacy protocol.  Postoperative course was uneventful.  Despite complaints of being weak and at times drowsy, she advanced well with physical therapy and with her knee range of motion.  She was discharged and transferred on postoperative day #5 to the PMR service for complete  inpatient rehabilitation prior to discharge home. DD:  08/24/00 TD:  08/26/00 Job: [id number]

## 2011-04-03 NOTE — Procedures (Signed)
[place]. [facility]  Patient:    [name], [name]                     MRN: [id number] Proc. Date: 07/29/00 Adm. Date:  [id number] Attending:  Drema Pry                           Procedure Report  HISTORY:  Ms. Ditullio is a 70 year old white female with a diagnosis of bilateral degenerative joint disease of the knees scheduled for bilateral total knee replacements and for whom epidural analgesia in the postoperative period has been requested, explained, understood, and accepted preoperatively as a part of medical surgical management.  PROCEDURE:  At the termination of surgery while still under general anesthesia Ms. Whirley is turned to the right lateral decubitus position.  Her back is prepped with Betadine and draped in the usual sterile fashion.  Using loss of resistance technique peridural tap was accomplished at the L1-L2 interspace with a 17 gauge Tuohy needle.  After aspiration for blood and CSF was negative a total of 10 cc of 0.50% lidocaine with 100 mcg of fentanyl was infused with no problems.  This was followed by the passage of a ______ peridural catheter 15 cm cephalad.  The catheter was then checked for patency and affixed to the patients back.  The patient was then returned to the supine position, awakened, and brought to the postanesthesia care unit where her peridural catheter will be connected to an infusion pump with an extra of fentanyl 5 mcg per cc., Marcaine 1/16% at an initial rate of 10 ml/hr to be adjusted as needed.  There were no complications.  She did well and will be followed in the usual fashion. DD:  07/29/00 TD:  07/31/00 Job: [id number]

## 2011-04-03 NOTE — Op Note (Signed)
[name], [name] NO.:  [id number]   MEDICAL RECORD NO.:  [id number]          PATIENT TYPE:  INP   LOCATION:  5004                         FACILITY:  [facility]   PHYSICIAN:  Harvie Junior, M.D.   DATE OF BIRTH:  1940/12/26   DATE OF PROCEDURE:  10/06/2005  DATE OF DISCHARGE:                                 OPERATIVE REPORT   PREOPERATIVE DIAGNOSIS:  Femoral neck fracture, left.   POSTOPERATIVE DIAGNOSIS:  Femoral neck fracture, left.   PROCEDURE:  Left cemented hemiarthroplasty with a Summit stem, 45 mm bipolar  head with 28 mm head, standard offset.   SURGEON:  Harvie Junior, M.D.   ASSISTANT:  Marshia Ly, P.A.-C.   ANESTHESIA:  General.   BRIEF HISTORY:  [name] is a 70 year old female with a long history of  having had previous right femoral neck fracture several years ago.  She was  treated with cemented bipolar and had done well.  She now had a left femoral  neck fracture and we talked about treatment options and ultimately felt that  since she had done well with the other side, cemented bipolar was the  appropriate course of action and she is brought to the operating room for  this procedure.   DESCRIPTION OF PROCEDURE:  The patient was brought to the operating room and  after adequate anesthesia was obtained with general anesthetic, the patient  was placed supine on the operating table.  She was then moved into the right  lateral decubitus position, all bony prominences were well padded.  The left  hip was then prepped and draped in the usual sterile fashion.  Following  this, attention was turned to the left hip where a curved incision was made  for a posterior approach to the left hip.  The subcutaneous tissue were  dissected down to the tensor fascia which was divided in line with its  fibers.  A Charnley retractor was put in place.  Attention was turned to the  posterior aspect of the hip where the piriformis as well as short external  rotators were taken down and the hip ball was then removed, sized to 45.  There was a tremendous labrum and we ultimately did have to make a little  nick in the labrum to allow for the 45 ball to go in but it seated nicely in  the acetabulum.  Once this was accomplished, attention was turned to the  stem which was reamed and then sequentially broached up to a level of 4, the  4 fit well.  We cut a calcar plane and then put in a standard with the plus  0 ball with the 45 bipolar head, did a trial reduction, excellent stability,  no tendency towards subluxation and dislocation.  At this point, the trials  were removed.  A cement restricter was put in place.  The cement was then  pressurized into the canal and a Summit stem was placed down the canal and  cemented in.  This was held until the cement hardened and then the final  bipolar head was put  on.  Reduction was undertaken after all the excess  cement was removed and excellent reduction was achieved.  Range of motion  was again excellent, no tendency towards instability.  At this point, the  short external rotators and capsule were repaired to the intertrochanteric  ridge over drill holes and the tensor fascia was then closed with a #1  Vicryl.  She was copiously irrigated.  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  We used antibiotics in the  cement and did copious irrigation once the cement was hardened.  After the  tensor fascia was closed with #1 Vicryl running, the skin was  closed with 0 and 2-0 Vicryl, and the skin with skin staples.  A sterile  compressive dressing was applied and the patient was placed in a knee  immobilizer.  She was taken to the recovery room where she was noted to be  in satisfactory condition.  Estimated blood loss was 200 mL.      Harvie Junior, M.D.  Electronically Signed     JLG/MEDQ  D:   10/07/2005  T:  10/07/2005  Job:  [id number]

## 2011-04-03 NOTE — Discharge Summary (Signed)
[place]. [facility]  Patient:    [name], [name]                     MRN: [id number] Adm. Date:  [id number] Disc. 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  OPERATIONS AND TREATMENTS:  L3-4 and L4-5 decompression laminectomies with L3-4 and L4-5 posterior lumbar interbody fusion with tangent wedges and local autograft coupled with posterolateral fusion utilizing pedicle screw __________ and local autograft from L3 to L5.  HISTORY OF PRESENT ILLNESS:  Ms. Welke is a 70 year old female who is progressively bothered by back and bilateral lower extremity pain, paresthesias and weakness.  MRI scanning and CT myelography demonstrates severe stenosis at L3-4 and L4-5. The patient presents now for decompression and fusion.  HOSPITAL COURSE:  The patient was admitted to the hospital, taken to the operating room where an uncomplicated decompression and fusion was performed of the L3-4 and L4-5 levels.  The patient awakened from anesthesia without any evidence of complication. The patient was gradually mobilized over time.  Pain control was difficult but eventually was achieved.  At the time of discharge the patient was ambulating with minimal assistance of a walker.  Her neurological status is intact.  Wound is healing well.  CONDITION ON DISCHARGE:  Improved.  FOLLOW-UP:  This is in one week in my office. DD:  06/08/01 TD:  06/09/01 Job: [id number]

## 2011-04-03 NOTE — Discharge Summary (Signed)
NAME:  [name], [name] NO.:  [id number]   MEDICAL RECORD NO.:  [id number]          PATIENT TYPE:  INP   LOCATION:  5004                         FACILITY:  [facility]   PHYSICIAN:  Harvie Junior, M.D.   DATE OF BIRTH:  July 03, 1941   DATE OF ADMISSION:  10/06/2005  DATE OF DISCHARGE:  10/10/2005                                 DISCHARGE SUMMARY   ADDENDUM DISCHARGE SUMMARY   Regarding the patient's elevated liver function studies, we will have her  follow up with her medical doctor within three weeks for evaluation of this.      Marshia Ly, P.A.      Harvie Junior, M.D.  Electronically Signed    JB/MEDQ  D:  12/02/2005  T:  12/02/2005  Job:  [id number]

## 2011-04-03 NOTE — Discharge Summary (Signed)
NAMEFORESTINE, MACHO NO.:  [id number]   MEDICAL RECORD NO.:  [id number]          PATIENT TYPE:  INP   LOCATION:  5004                         FACILITY:  [facility]   PHYSICIAN:  Harvie Junior, M.D.   DATE OF BIRTH:  Dec 09, 1940   DATE OF ADMISSION:  10/06/2005  DATE OF DISCHARGE:  10/10/2005                                 DISCHARGE SUMMARY   ADMISSION DIAGNOSES:  1.  Displaced femoral neck fracture, left hip.  2.  Hypertension.  3.  Hypothyroidism.  4.  Osteoarthritis.   DISCHARGE DIAGNOSES:  1.  Displaced femoral neck fracture, left hip.  2.  Hypertension.  3.  Hypothyroidism.  4.  Osteoarthritis.  5.  Elevated liver function studies, questionable etiology.  6.  Osteopenia.  7.  Left ankle sprain.   PROCEDURES IN HOSPITAL:  Hemiarthroplasty, left hip by Harvie Junior, M.D.  on October 07, 2005.   HISTORY OF PRESENT ILLNESS:  Briefly, [name] is a 70 year old  patient of our practice who fell on the afternoon of October 06, 2005.  She  sustained an injury to her left hip and had left hip pain and inability to  weight-bear.  She was brought to the [facility] emergency room  where x-rays showed a displaced femoral neck fracture on the left.  She had  a previous history of hemiarthroplasty of her right hip and bilateral total  knee replacements done by Dr. Doristine Section.  Postoperatively on one of her  knees she had a postoperative Staphylococcus infection which required I and  D but she ultimately healed that without difficulty.  Based upon her  clinical and radiographic findings regarding her left hip she is admitted  for initially pain control and then surgical intervention for her left hip  fracture.   LABORATORY DATA:  Pertinent laboratory studies included hemoglobin on  admission 11.3, hematocrit 39.3, white blood cell count 9.9.  On  postoperative day #1 her hemoglobin was 10.0, #2 10.0 and #3 10.0.  Pro Time  on admission was 14.0  seconds with INR 1.1, PTT was 26.  On the date of  discharge on Coumadin therapy, her Pro Time was 29.2 seconds with INR of  2.8.  CMET on admission was within normal limits other than slightly  decreased calcium at 8.0 and total protein at 5.6.  Her AST was 228 and her  ALT was 59.  Urinalysis on admission showed cloudy appearance with no white  blood cells.  She had A positive blood.  Preoperative electrocardiogram  showed normal sinus rhythm, normal electrocardiogram.  Chest x-ray  preoperatively showed large hiatal hernia, cardiomegaly and right middle  lobe scarring. Postoperative x-rays of the left hip showed satisfactory  appearance of left hip hemiarthroplasty.   HOSPITAL COURSE:  The patient was admitted through the emergency room on  October 06, 2005.  She was placed in Buck's traction and given intravenous  pain medication.  On October 07, 2005 she was brought to the operating room  where she underwent a cemented hemiarthroplasty of  her left hip done by Dr.  Jodi Geralds.  The patient tolerated the procedure well.  She was put on  Ancef 1 gram preoperatively and 5 doses postoperatively.  She was put on a  PCA morphine pump for pain control.  On postoperative day #1 she had  moderate left hip pain.  She was not out of bed yet. She had a fever of  101.3.  Vital signs were stable.  Her potassium was 3.9. Her INR was 1.4.  Postoperative x-rays showed good position of the prosthesis.  Neurovascular  status was intact to the left lower extremity.  We held her blood pressure  medications as her blood pressure was a little low at 101/56.  Her  intravenous fluids were continued.  On postoperative day #2 she had some  moderate hip pain.  She complained of left ankle pain.  She was taking  fluids without difficulty.  Foley catheter which had been placed at the time  of surgery was intact.  Vital signs were then stable and she was afebrile.  X-rays were taken of her left ankle which were  negative. Her INR was 2.0 on  Coumadin antithrombolytic therapy.  Her intravenous was converted to a  saline lock and her PCA morphine was discontinued.  Her Foley catheter was  discontinued.  She continued to progress nicely with physical therapy and on  postoperative day #3 she was doing well.  She was taking fluids and voiding  without difficulty, progressed well with physical therapy and ambulated in  the hall.  She had no fever and her vital signs were stable.  Her hemoglobin  was 10.0.  Her INR was 2.8. Her left hip wound was benign. Her dressing was  changed prior to her discharge.  She was discharged home after being seen by  physical therapy the day of the discharge.  She was given prescription for  Coumadin X1 month postoperatively and prescription for [facility] as  needed for pain.  She was instructed to ambulate and weight bear as  tolerated on the left with a walker.  Hip precautions were to be followed.  She would follow up with Dr. Luiz Blare in 10 days in the office.  She will need  home health physical therapy and Coumadin management by home health.   CONDITION ON DISCHARGE:  Improved.   DIET:  Diet on discharge is regular.      Marshia Ly, P.A.      Harvie Junior, M.D.  Electronically Signed    JB/MEDQ  D:  12/02/2005  T:  12/02/2005  Job:  [id number]

## 2011-04-03 NOTE — Op Note (Signed)
NAMECHRISTINIA, [name] NO.:  [id number]   MEDICAL RECORD NO.:  [id number]          PATIENT TYPE:  OIB   LOCATION:  2550                         FACILITY:  [facility]   PHYSICIAN:  Lorre Munroe., M.D.DATE OF BIRTH:  1940/11/27   DATE OF PROCEDURE:  12/03/2004  DATE OF DISCHARGE:                                 OPERATIVE REPORT   PREOPERATIVE DIAGNOSIS:  Symptomatic gallstones.   POSTOPERATIVE DIAGNOSIS:  Symptomatic gallstones.   OPERATION:  Laparoscopic cholecystectomy.   SURGEON:  Lebron Conners, M.D.   ANESTHESIA:  General   DESCRIPTION OF PROCEDURE:  After the patient was monitored and anesthetized  and had routine preparation and draping of the abdomen infiltrated local  anesthetic just below the umbilicus and subsequently at 3 more locations for  ports. I made a short transverse incision just below the umbilicus and  incised the midline fascia, carefully; then bluntly entered the peritoneal  cavity; and cleared away adhesions until I was sure there was safe access to  the peritoneal cavity. I then placed a #0 Vicryl pursestring suture in the  fascia and inserted a Hassan cannula secured by the pursestring suture and  inflated the abdomen with CO2. I then examined the abdominal contents and  saw that all of the intestines had a normal appearance; no distension with  good peristalsis with no abnormalities of peritoneal surfaces; and saw no  abnormalities of the liver or gallbladder, except the gallbladder was quite  large and floppy. It did not appear to be acutely inflamed.   After placement of 3 additional ports and routine positioning of the patient  head up, foot down, and tilted to the left; I grasped the fundus of the  gallbladder and pulled it up over the liver and took down some adhesions of  omentum to the undersurface of the liver and gallbladder using the scissors,  cautery, and blunt dissection. I then carefully identified the infundibulum  of the gallbladder and pulled it laterally and dissected the hepatoduodenal  ligament until I clearly saw the cystic duct emerging from the infundibulum  and coursing down to the common bile duct. I clipped the cystic duct with 3  clips and divided it. I felt a cholangiogram was unnecessary since the  biliary symptoms were typical; there was no abnormality in the liver tests;  no history of jaundice; and the ducts were small.  I then dissected out the  cystic artery, clipped and divided it as well. I dissected the gallbladder  from the liver using the cautery.   Hemostasis was excellent. I removed the gallbladder through the umbilical  incision and tied the pursestring suture; then removed the 2 lateral ports  under direct vision. I allowed the CO2 to escape and removed the epigastric  port. I closed all the skin incisions with intracuticular 4-0 Vicryl and  Steri-Strips. She tolerated the operation well.      Will   WB/MEDQ  D:  12/03/2004  T:  12/03/2004  Job:  [id number]   cc:   Georgann Housekeeper, MD  [address]  [place]  [place]  [address]  Fax: [phone]

## 2011-04-03 NOTE — Discharge Summary (Signed)
NAME:  [name], [name]                        ACCOUNT NO.:  [id number]   MEDICAL RECORD NO.:  [id number]                   PATIENT TYPE:  INP   LOCATION:  5001                                 FACILITY:  [facility]   PHYSICIAN:  Deidre Ala, M.D.                 DATE OF BIRTH:  06/02/1941   DATE OF ADMISSION:  10/19/2002  DATE OF DISCHARGE:  10/23/2002                                 DISCHARGE SUMMARY   ADMISSION DIAGNOSIS:  Loosening of the tibial tray of her left total knee  arthroplasty.   DISCHARGE DIAGNOSES:  1. Loosening of the tibial tray of her left total knee arthroplasty.  2. Status post revision of left total knee arthroplasty.   HOSPITAL COURSE:  The patient was admitted to [facility], taken to  the operating room on 10/19/02, for revision of her left total knee  arthroplasty, which was revised to a long canal filling stem.  A new poly  and a new patellar poly were both used.  This was done with a tourniquet  time of 1 hour and 36 minutes, and estimated blood loss of 200 cc under  general anesthesia with a postoperative femoral nerve block.  She was taken  to the recovery room in stable condition.  Coumadin and Lovenox were started  per protocol.  Physical therapy and occupational therapy and rehabilitation  were consulted.  On 10/20/02, postoperative day #1, she was doing well,  poorly controlled on her low dose PCA, and was subsequently changed to full  dose PCA with Vicodin for breakthrough pain.  Her vital signs were stable.  She had spiked a temperature of 102.3 in the morning.  Her dressing was  clean and dry with no active drainage.  Calf was soft.  The patient was out  of bed to chair, ambulate weightbearing as tolerated.  Her hemoglobin was  8.6.  At the time it was rechecked the hemoglobin had fallen into the 8 to  8.2 range, we planned to transfuse packed red blood cells.  On 10/21/02,  postoperative day #2, her pain was much better controlled.  She had  been out  of bed.  Vital signs were stable and afebrile.  The wound was benign.  The  dressing was changed.  CPM was discontinued.  Calf was soft and nontender.  Her hemoglobin and hematocrit were 9.4 and 28.  She had a PTT of 24.4, INR  of 2.6.  She had a low potassium at 3.3, and elevated glucose of 131.  The  plan was to attempt to wean her off PCA by using more p.o. pain medication  as tolerated.  On 10/22/02, postoperative day #3, her Foley was discontinued,  she was without complaints.  The IV infiltrated, so it was discontinued.  She had spiked a temperature up to 102.6 in the night, but was down to 97.6  in the morning.  The knee  wound was benign.  Calf was soft and nontender.  Her IV was discontinued.  The Ancef was discontinued.  Urine culture was  sent, Foley was discontinued.  The plan was made to send her home if she was  feeling good the next day.  On 10/23/02, postoperative day #4, her urine  culture was negative, she was feeling better in the morning, her maximum  temperature over the past 24 hours was 99.1 degrees.  The wound was benign.  The calf was soft and nontender.  Plan was made to send her home.  She was  weightbearing as tolerated, ambulating with a walker, mobilized to the left  knee and toe cleared by physical therapy.   WOUND CARE:  Keep clean and dry.  She could shower on Tuesday, and the  staples were going to come out in 1-1/2 weeks to 2 weeks.   DIET:  Regular as tolerated.   DISCHARGE INSTRUCTIONS:  Call for increasing pain, redness, drainage, or  bleeding, shortness of breath or cough, fever of greater then 101 or chills.   DISCHARGE MEDICATIONS:  1. Diovan 160/12.5 mg one p.o. q.d.  2. Synthroid 0.125 mg one p.o. q.d.  3. Arthrotek 75/200 mg one p.o. q.d.  4. Urised two p.o. q.a.m.  5. Allegra-D 180 mg one p.o. q.d.  6. Lorcet 10/650 mg one p.o. q.4-6h. p.r.n. #50 with no refills.  7. Diazepam 2 mg one p.o. q.8h. p.r.n. anxiety #35 with no  refills.  8. Robaxin 500 mg one p.o. q.6h. p.r.n. spasms/cramps #35 with two refills.  9. Coumadin 1 mg one p.o. q.d. until told otherwise #60 with one refill.  10.      Laxative of choice.  11.      Enema of choice.  12.      Multivitamins with iron q.d.   FOLLOWUP:  On Monday, 11/13/02, she was instructed to call for an  appointment.   CONDITION ON DISCHARGE:  Stable.      Madilyn Fireman, P.A.-C.                       Deidre Ala, M.D.    AC/MEDQ  D:  11/24/2002  T:  11/25/2002  Job:  [id number]

## 2011-04-03 NOTE — Op Note (Signed)
[place]  Patient:    [name]                     MRN: [id number] Proc. Date: 07/29/00 Adm. Date:  [id number] Attending:  Drema Pry CC:         Feliciana Rossetti, M.D. [facility], [address] [place], [place]., [address]                           Operative Report  PREOPERATIVE DIAGNOSIS:  End-stage degenerative joint disease bilateral knees.  POSTOPERATIVE DIAGNOSIS:  End-stage degenerative joint disease bilateral knees.  PROCEDURE: 1. Right total knee arthroplasty using cemented Depuy components, LCS type    with rotating platform. 2. Left total knee arthroplasty to be dictated by Dr. Marcene Corning.  SURGEON:  Jearld Adjutant, M.D.  ASSISTANT:  Lubertha Basque. Jerl Santos, M.D. and Vilinda Blanks. Moye, P.A.-C.  ANESTHESIA:  General endotracheal to be followed by epidural fentanyl catheter postoperative.  CULTURES:  None.  DRAINS:  2 medium Hemovacs to autovac  ESTIMATED BLOOD LOSS:  100 cc  REPLACEMENT:  Without.  TOURNIQUET TIME:  53 minutes.  PATHOLOGIC FINDINGS AND HISTORY:   [name] is a 70 year old female with progressive end-stage degenerative joint disease both knees bone-on-bone.  She is completely miserable with her knees, limiting activities.  It was therefore elected to proceed with bilateral total knee arthroplasty.  I assisted Dr. Jerl Santos on the left knee, and he assisted me on the right knee.  He will dictate it separately.  At surgery on the right knee we found tricompartment degenerative joint disease bone-on-bone with ebernated bone medially.  We achieved excellent ligament balance with the standard plus femur, a large tibia, a standard plus patella, and a standard plus to large rotating platform 10 mm thick.  Two batches of Zinacef were used with again 2 batches of cement, 750 mg per batch.  Again, excellent alignment and positions obtained with full extension with flexion to 110 degrees,  stable to varus valgus stressing and good patellar tracking without need for a lateral retinacular release.  DESCRIPTION OF PROCEDURE:  With adequate anesthesia obtained using endotracheal technique, 1 g of Ancef given IV prophylaxis then another gram given at tourniquet let down on the left knee.  The patient was placed in the supine position.  Both lower extremities were prepped from the toes to the tourniquets in a standard fashion.  After standard prepping and draping on the right knee Esmarch exsanguination was used, and the tourniquet was let up to 350 mm of mercury.  A medium parapatellar skin incision was then followed by a medium parapatellar retinacular incision.  Incision was deepened sharply with the knife and hemostasis obtained using the Bovie electrocoagulator.  The patella was everted.  Fat pad excised as well as menisci and ACL and PCL.  We then removed synovitis and osteophytes throughout the knee.  The knee was flexed.  Tibial spine amputated and intramedullary guide placed down the canal.  Tibial cutting jig put in place just below the medial osteophyte and the tibial cut made.  We then sized to standard plus femur, set it with a 15 mm lollipop insertion, set the rotation, pinned it, made our anterior posterior cuts and it fit a 10 mm.  We actually redusted with a saw the tibia and it was a little loser, 10 mm on the flexion.  We then placed the 4  degree distal femoral valgus cutting jig, we made backcut at the first position, and it fit nicely with the 10 mm in full extension.  We then put the anterior posterior chamfer cutting jig in place, made those cuts, the far posterior cuts and the notch cut.  We then exposed the proximal tibia, sized to a large, set the rotation and then pinned it, and then used the central conical cutting jig to cut for the stem.  The trial large tray was then put in place with the 10 mm rotating platform.  The trial femoral component was  put in place and the knee articulated through a range of motion.  We then calibered the patella, it was a 19.  We set the guide on a 14, it cut to a 10 or 11.  We were very careful with our 3 peg hole template and we did not penetrate the opposite cortex.  The patella, although thin, we did replace 9 mm so that it was equal to what we took out.  We took a very thin sliver of bone initially, and the bone was very soft.  In any case the patella did fit well, tracked well.  All trial components were removed and then we jet lavaged the knee, checked the components as they came on the field for exact sizing.  We then mixed the Zinacef in the cement using the cement gun.  We then cemented on the tibial component after exposing the proximal tibia, impacted it, removed excess cement.  We then put in the rotating platform.  We then cemented on the femoral component, impacted it, removed excess cement.  We then cemented on the patellar component, squeezed it down, impacted it, removed excess cement, and then trialed it for tracking.  The knee was then held in flexion while the cement cured.  When the cement cured and excess cement had been removed the bleeding points were cauterized after the tourniquet was let down.  Hemovac drains were placed in the superior lateral portal and placed in the medial lateral gutters.  The wound was then closed in layers with figure-of-eight stitches on the retinaculum with a VMO advancement.  We then closed the deep subcutaneous with 0 Vicryl running, superficial with 2-0 Vicryl running and skin staples.  A bulky sterile compressive dressing was applied and attention was turned to the left knee where a left total knee arthroplasty was performed by Dr. Jerl Santos and will be dictated.  When both wounds were closed and the left knee was closed in a similar fashion to the right, bulky compressive dressings applied, the Hemovacs were hooked up to autovac and knee  immobilizers placed.  The patient having tolerated the procedure well was turned to her side where an epidural fentanyl catheter was  placed and the patient was then awakened and taken to the recovery room in satisfactory condition for routine postoperative care. DD:  07/29/00 TD:  07/31/00 Job: [id number]

## 2011-04-03 NOTE — Op Note (Signed)
[facility]  Patient:    [name], [name]                     MRN: [id number] Proc. Date: 05/20/01 Adm. Date:  [id number] Attending:  Donn Pierini                           Operative Report  PREOPERATIVE DIAGNOSES: 1. 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 2. L4-5 degenerative grade 1 spondylolisthesis with marked stenosis    and radiculopathy.  POSTOPERATIVE DIAGNOSES: 1. L3-4 stenosis with radiculopathy of neurogenic claudication. 2. L4-5 degenerative grade 1 spondylolisthesis with marked stenosis    and radiculopathy.  PROCEDURE PERFORMED: 1. L3-4 and L4-5 decompressive lumbar laminectomies with    foraminotomies bilaterally. 2. L3-4 and L4-5 posterior lumbar fusion utilizing tangent wedges    and local autograft. 3. L3 through L5 posterolateral fusion rising pedicle screw    segmental instrumentation with local autograft.  SURGEON:  [name], M.D.  ASSISTANT:  Donalee Black, [name]., M.D.  ANESTHESIA:  General endotracheal.  INDICATIONS:  [name] is a 70 year old female with a history of severe back and bilateral lower extremity pain.  She has symptoms of both radiculopathy and claudication secondary to stenosis at the L3-4 and L4-5 levels.  The patient has failed conservative management.  MRI scanning demonstrates significant stenosis at the L3-4 level and a degenerative grade 1 spondylolisthesis at L4-5 with marked stenosis.  The patient has been counseled as to her options.  She has flexion and extension films, which demonstrates dynamic instability of both L3-4 and L4-5.  The patient has elected to proceed with lumbar decompression and fusion surgery.  DESCRIPTION OF PROCEDURE:  The patient was brought to the operating room and placed on the table in a supine position.  After adequate level of anesthesia was achieved, the patient was positioned prone onto the Wilson frame, and properly padded for admission  to the lumbar region.  She was shaved, prepped, and draped in a sterile fashion.  A 10 blade was used to make a linear incision overlying the L2, 3, 4, and 5 levels.  This was carried down sharply in the midline.  Subperiosteal dissection was then performed exposing the laminar of L3, L4, and L5 as well as the transverse process of L3, L4, and L5. A deep self-retaining retractor was placed.  Intraoperative x-ray was taken and the level was confirmed.  Decompressive laminectomies were then performed at L3-4 and 5 utilizing Leksell rongeurs, Kerrison rongeurs, and a high speed drill.  The entire lamina of L3, L4, and L5 were removed.  The entire aspect of the inferior facet of L3 and L4 were removed bilaterally.  The superior facets of L4 and L5 were removed bilaterally.  All bone was cleaned and used for later use in autografting.  The ligamentum flavum was then elevated using Kerrison rongeurs.  The underlying thecal sac and exiting L3, L4, and L5 were identified.  Wide foraminotomies were performed along the course of the nerve roots themselves.  The patient had a hyperlordotic spine making decompression and interbody fusion difficult.  The epidural venous plexus was coagulated and cut on both sides.  Starting first at L34, the thecal sac and nerve roots were protected.  The disk space was entered with a 15 blade in the standard fashion.  A wide disk space was then achieved using pituitary rongeurs, upward angle pituitary rongeurs, and curets.  The  procedure was repeated on the contralateral side again without complication.  It was then proceeded bilaterally at L4-5.  The disk space at L3-4 was then sequentially distracted to 11 mm.  Then working on the contralateral side, the nerve roots were retracted and protected.  The disk space was then reamed and cut with a 10 mm tangent chisel.  A 10 mm x 20 mm tangent wedge was then impacted into place and assessed approximately 3 mm in the  posterior cortical surface.  The procedure was then repeated on the patients contralateral side.  Just before this was done, the interspace was packed with morcellized autograft.  C-arm fluoroscopy showed good position of the bone grafts and the proper operative level was normal.  The procedure was then carried out at L4-5.  Because of the patients spondylolisthesis and lordosis, once again it was quite difficult. The disk space was distracted up to 11 mm.  Starting first on the patients right side via nerve roots were protected.  The disk space was reamed and then cut with a 10 mm chisel.  A 10 x 20 mm tangent wedge was then impacted into place.  The procedure was then repeated on the contralateral side again without complication.  Prior to installation of the second wedge, the interspace was packed with morcellized autograft.  Final images revealed good position of the bone grafts with improved alignment of the spine.  The pedicles of L3, L4, and L5 were then isolated by surface landmarks and by fluoroscopic guidance.  Superficial bone was removed over the posterior aspect of the pedicle using a high speed drill.  Each pedicle was then probed using a pedicle awl.  All tracts were found to be solid within bone.  All tracts were then tapped under fluoroscopic guidance using a 5.25 mm screw tap.  Once again all screw tap holes were found to be solidly within the bone with no evidence of pedicle violation.  A 6.75 x 45 mm STRS pedicle screws were then placed at L3 bilaterally.  A 6.75 x 40 mm STRS pedicle screws were then placed bilaterally at L4 and L5.  All screws were given final tightening and found to solid in bone.  A 70 mm segment of titanium rod was then contoured appropriately and placed over the screw heads of L3, L4, and L5.  Locking caps were placed over the screw heads.  The locking caps were then engaged in a sequential fashion to place the construct under compression.  Final  images revealed good position of the bone graft and hardware with normal alignment of the spine.  A blunt probe was passed easily along the course of the L3, L4,  and L5 nerve roots.  The was no evidence of injury to the thecal sac or nerve roots.  The transverse processes of L3, L4, and L5 were then decorticated using a high speed drill.  Morcellized autograft was packed posterolaterally. Gelfoam was left over the thecal sac.  The wound was then closed in layers with Vicryl sutures.  Steri-Strips and sterile dressing were applied.  There were no apparent operative complications.  The patient tolerated the procedure well and she was returned to the recovery room postoperatively.  Blood loss was considerable, estimated at 100 cc.  The patient received 400 cc of Cell Saver in return.  She started off anemic with a hematocrit of 33.  She will receive a postoperative transfusion. DD:  05/20/01 TD:  05/20/01 Job: [id number]

## 2014-06-10 ENCOUNTER — Encounter (HOSPITAL_COMMUNITY): Payer: Self-pay | Admitting: Emergency Medicine

## 2014-06-10 ENCOUNTER — Emergency Department (HOSPITAL_COMMUNITY): Payer: Medicare Other

## 2014-06-10 ENCOUNTER — Emergency Department (HOSPITAL_COMMUNITY)
Admission: EM | Admit: 2014-06-10 | Discharge: 2014-06-10 | Disposition: A | Discharge status: Home / Self Care | Payer: Medicare Other | Attending: Emergency Medicine | Admitting: Emergency Medicine

## 2014-06-10 DIAGNOSIS — Z791 Long term (current) use of non-steroidal anti-inflammatories (NSAID): Secondary | ICD-10-CM | POA: Insufficient documentation

## 2014-06-10 DIAGNOSIS — S46909A Unspecified injury of unspecified muscle, fascia and tendon at shoulder and upper arm level, unspecified arm, initial encounter: Secondary | ICD-10-CM | POA: Diagnosis present

## 2014-06-10 DIAGNOSIS — Y92009 Unspecified place in unspecified non-institutional (private) residence as the place of occurrence of the external cause: Secondary | ICD-10-CM | POA: Diagnosis not present

## 2014-06-10 DIAGNOSIS — Z862 Personal history of diseases of the blood and blood-forming organs and certain disorders involving the immune mechanism: Secondary | ICD-10-CM | POA: Insufficient documentation

## 2014-06-10 DIAGNOSIS — Y9389 Activity, other specified: Secondary | ICD-10-CM | POA: Insufficient documentation

## 2014-06-10 DIAGNOSIS — S42291A Other displaced fracture of upper end of right humerus, initial encounter for closed fracture: Secondary | ICD-10-CM

## 2014-06-10 DIAGNOSIS — Z8639 Personal history of other endocrine, nutritional and metabolic disease: Secondary | ICD-10-CM | POA: Diagnosis not present

## 2014-06-10 DIAGNOSIS — S42293A Other displaced fracture of upper end of unspecified humerus, initial encounter for closed fracture: Secondary | ICD-10-CM | POA: Diagnosis not present

## 2014-06-10 DIAGNOSIS — S4980XA Other specified injuries of shoulder and upper arm, unspecified arm, initial encounter: Secondary | ICD-10-CM | POA: Insufficient documentation

## 2014-06-10 DIAGNOSIS — I10 Essential (primary) hypertension: Secondary | ICD-10-CM | POA: Diagnosis not present

## 2014-06-10 DIAGNOSIS — W108XXA Fall (on) (from) other stairs and steps, initial encounter: Secondary | ICD-10-CM | POA: Diagnosis not present

## 2014-06-10 HISTORY — DX: Essential (primary) hypertension: I10

## 2014-06-10 HISTORY — DX: Disorder of thyroid, unspecified: E07.9

## 2014-06-10 MED ORDER — MORPHINE SULFATE 4 MG/ML IJ SOLN: 4.0000 mg | Freq: Once | INTRAMUSCULAR | Status: DC

## 2014-06-10 MED ORDER — OXYCODONE-ACETAMINOPHEN 5-325 MG PO TABS
2.0000 | ORAL_TABLET | Freq: Once | ORAL | Status: AC
  Administered 2014-06-10: 2 via ORAL
  Filled 2014-06-10: qty 2

## 2014-06-10 MED ORDER — FENTANYL CITRATE 0.05 MG/ML IJ SOLN
50.0000 ug | Freq: Once | INTRAMUSCULAR | Status: AC
  Administered 2014-06-10: 50 ug via INTRAVENOUS
  Filled 2014-06-10: qty 2

## 2014-06-10 MED ORDER — HYDROMORPHONE HCL PF 1 MG/ML IJ SOLN
1.0000 mg | Freq: Once | INTRAMUSCULAR | Status: AC
  Administered 2014-06-10: 1 mg via INTRAVENOUS
  Filled 2014-06-10: qty 1

## 2014-06-10 MED ORDER — OXYCODONE-ACETAMINOPHEN 5-325 MG PO TABS: 1.0000 | ORAL_TABLET | Freq: Four times a day (QID) | ORAL | Status: DC | PRN

## 2014-06-10 NOTE — Discharge Instructions (Signed)
Humerus Fracture, Treated with Immobilization  The humerus is the large bone in your upper arm. You have a broken (fractured) humerus. These fractures are easily diagnosed with X-rays.  TREATMENT   Simple fractures which will heal without disability are treated with simple immobilization. Immobilization means you will wear a cast, splint, or sling. You have a fracture which will do well with immobilization. The fracture will heal well simply by being held in a good position until it is stable enough to begin range of motion exercises. Do not take part in activities which would further injure your arm.   HOME CARE INSTRUCTIONS    Put ice on the injured area.   Put ice in a plastic bag.   Place a towel between your skin and the bag.   Leave the ice on for 15-20 minutes, 03-04 times a day.   If you have a cast:   Do not scratch the skin under the cast using sharp or pointed objects.   Check the skin around the cast every day. You may put lotion on any red or sore areas.   Keep your cast dry and clean.   If you have a splint:   Wear the splint as directed.   Keep your splint dry and clean.   You may loosen the elastic around the splint if your fingers become numb, tingle, or turn cold or blue.   If you have a sling:   Wear the sling as directed.   Do not put pressure on any part of your cast or splint until it is fully hardened.   Your cast or splint can be protected during bathing with a plastic bag. Do not lower the cast or splint into water.   Only take over-the-counter or prescription medicines for pain, discomfort, or fever as directed by your caregiver.   Do range of motion exercises as instructed by your caregiver.   Follow up as directed by your caregiver. This is very important in order to avoid permanent injury or disability and chronic pain.  SEEK IMMEDIATE MEDICAL CARE IF:    Your skin or nails in the injured arm turn blue or gray.   Your arm feels cold or numb.   You develop severe  pain in the injured arm.   You are having problems with the medicines you were given.  MAKE SURE YOU:    Understand these instructions.   Will watch your condition.   Will get help right away if you are not doing well or get worse.  Document Released: 02/08/2001 Document Revised: 01/25/2012 Document Reviewed: 12/17/2010  ExitCare Patient Information 2015 [employer]. This information is not intended to replace advice given to you by your health care provider. Make sure you discuss any questions you have with your health care provider.

## 2014-06-10 NOTE — ED Notes (Signed)
EDP at bedside  

## 2014-06-10 NOTE — ED Notes (Signed)
Pt from home via EMS with c/o while stepping down 1 step into another room pt fell causing injury to right shoulder/upper arm. Fire dept on scene placed and sling and swath, which pt refuses to remove at this time. -LOC.

## 2014-06-10 NOTE — ED Notes (Signed)
Patient transported to X-ray 

## 2014-06-10 NOTE — ED Provider Notes (Signed)
CSN: [id number]     Arrival date & time 06/10/14  1424 History   First MD Initiated Contact with Patient 06/10/14 1457     Chief Complaint  Patient presents with  . Fall  . Shoulder Pain     (Consider location/radiation/quality/duration/timing/severity/associated sxs/prior Treatment) HPI Comments: Slipped down a step walking into the breezeway at her house. No preceding dizziness, CP, SOB. Landed on her R arm, which was tucked into her body.  Patient is a 73 y.o. female presenting with fall and shoulder pain. The history is provided by the patient.  Fall This is a new problem. The current episode started 1 to 2 hours ago. The problem occurs constantly. The problem has not changed since onset.Pertinent negatives include no chest pain, no abdominal pain and no shortness of breath. Nothing aggravates the symptoms. Nothing relieves the symptoms. She has tried nothing for the symptoms.  Shoulder Pain Pertinent negatives include no chest pain, no abdominal pain and no shortness of breath.    Past Medical History  Diagnosis Date  . Hypertension   . Thyroid disease    Past Surgical History  Procedure Laterality Date  . Cholecystectomy    . Abdominal hysterectomy  Partial  . Appendectomy    . Replacement total knee  x 2  . Back surgery    . Wrist surgery    . Breast lumpectomy Left    No family history on file. History  Substance Use Topics  . Smoking status: Never Smoker   . Smokeless tobacco: Not on file  . Alcohol Use: No   OB History   Grav Para Term Preterm Abortions TAB SAB Ect Mult Living                 Review of Systems  Constitutional: Negative for fever and chills.  Respiratory: Negative for cough and shortness of breath.   Cardiovascular: Negative for chest pain.  Gastrointestinal: Negative for vomiting and abdominal pain.  All other systems reviewed and are negative.     Allergies  Codeine  Home Medications   Prior to Admission medications    Medication Sig Start Date End Date Taking? Authorizing Provider  albuterol (PROVENTIL HFA;VENTOLIN HFA) 108 (90 BASE) MCG/ACT inhaler Inhale 2 puffs into the lungs every 6 (six) hours as needed for wheezing or shortness of breath.   Yes Historical Provider, MD  ibuprofen (ADVIL,MOTRIN) 200 MG tablet Take 800 mg by mouth every 6 (six) hours as needed for headache or moderate pain.   Yes Historical Provider, MD   BP 188/102  Pulse 100  Temp(Src) 97.8 F (36.6 C) (Oral)  Resp 20  Ht 5' 4.5" (1.638 m)  Wt 130 lb (58.968 kg)  BMI 21.98 kg/m2  SpO2 93% Physical Exam  Nursing note and vitals reviewed. Constitutional: She is oriented to person, place, and time. She appears well-developed and well-nourished. No distress.  HENT:  Head: Normocephalic and atraumatic.  Mouth/Throat: Oropharynx is clear and moist.  Eyes: EOM are normal. Pupils are equal, round, and reactive to light.  Neck: Normal range of motion. Neck supple.  Cardiovascular: Normal rate and regular rhythm.  Exam reveals no friction rub.   No murmur heard. Pulmonary/Chest: Effort normal and breath sounds normal. No respiratory distress. She has no wheezes. She has no rales.  Abdominal: Soft. She exhibits no distension. There is no tenderness. There is no rebound.  Musculoskeletal: Normal range of motion. She exhibits no edema.       Right upper arm: She  exhibits bony tenderness (central humerus). She exhibits no edema, no deformity and no laceration.  Neurological: She is alert and oriented to person, place, and time. No cranial nerve deficit. She exhibits normal muscle tone.  Skin: Skin is warm. No rash noted. She is not diaphoretic.    ED Course  Procedures (including critical care time) Labs Review Labs Reviewed - No data to display  Imaging Review Dg Chest 1 View  06/10/2014   CLINICAL DATA:  Fall today.  Right arm pain.  EXAM: CHEST - 1 VIEW  COMPARISON:  10/18/2009  FINDINGS: The cardiac silhouette is upper limits of  normal in size, accentuated by AP technique. The patient has taken a shallower inspiration than on the prior study. There is no evidence of airspace consolidation, edema, pleural effusion, or pneumothorax. There is a moderate-sized hiatal hernia. Surgical clips are noted in the left axilla.  There is a proximal right humerus fracture, more fully evaluated on separate, dedicated humerus radiographs.  IMPRESSION: Shallow inspiration without evidence of acute cardiopulmonary abnormality. Right humerus fracture.   Electronically Signed   By: Logan Bores   On: 06/10/2014 16:38   Dg Humerus Right  06/10/2014   CLINICAL DATA:  [place].  Right shoulder pain.  EXAM: RIGHT HUMERUS - 2+ VIEW  COMPARISON:  None.  FINDINGS: Comminuted and displaced fracture of the humeral head and neck. No dislocation. Prior surgical changes from a distal clavicle resection are noted. The humeral shaft is intact.  IMPRESSION: Comminuted fracture of the humeral head and neck.   Electronically Signed   By: Kalman Jewels M.D.   On: 06/10/2014 16:29     EKG Interpretation None      MDM   Final diagnoses:  Humeral head fracture, right, closed, initial encounter    17F here s/p fall. Mechanical fall down a step. Landed on R arm which was tucked into her body. On exam, shoulder located, mid humeral tenderness on R. Normal elbow, forearm, wrist, hand exam. No other extremity injury. No chest or belly tenderness. Hit her head on the hard floor, no LOC. Will scan her head and xray her R arm. Xray shows Humeral head and neck fracture. Placed in a sling. Patient refused CT of Head.  Given Ortho f/u. Has support at home, can maneuver with her sling in place. I have reviewed all labs and imaging and considered them in my medical decision making.   Osvaldo Shipper, MD 06/10/14 [id number]

## 2018-01-18 ENCOUNTER — Emergency Department (HOSPITAL_COMMUNITY): Payer: Medicare Other

## 2018-01-18 ENCOUNTER — Encounter (HOSPITAL_COMMUNITY): Payer: Self-pay

## 2018-01-18 ENCOUNTER — Inpatient Hospital Stay (HOSPITAL_COMMUNITY)
Admission: EM | Admit: 2018-01-18 | Discharge: 2018-01-22 | DRG: 480 | Disposition: A | Discharge status: Skilled Nursing Facility | Payer: Medicare Other | Attending: Internal Medicine | Admitting: Internal Medicine

## 2018-01-18 ENCOUNTER — Inpatient Hospital Stay (HOSPITAL_COMMUNITY): Payer: Medicare Other

## 2018-01-18 ENCOUNTER — Other Ambulatory Visit: Payer: Self-pay

## 2018-01-18 DIAGNOSIS — R918 Other nonspecific abnormal finding of lung field: Secondary | ICD-10-CM | POA: Diagnosis present

## 2018-01-18 DIAGNOSIS — S7291XS Unspecified fracture of right femur, sequela: Secondary | ICD-10-CM | POA: Diagnosis not present

## 2018-01-18 DIAGNOSIS — D509 Iron deficiency anemia, unspecified: Secondary | ICD-10-CM | POA: Diagnosis present

## 2018-01-18 DIAGNOSIS — E079 Disorder of thyroid, unspecified: Secondary | ICD-10-CM

## 2018-01-18 DIAGNOSIS — E039 Hypothyroidism, unspecified: Secondary | ICD-10-CM | POA: Diagnosis present

## 2018-01-18 DIAGNOSIS — R413 Other amnesia: Secondary | ICD-10-CM | POA: Diagnosis present

## 2018-01-18 DIAGNOSIS — E538 Deficiency of other specified B group vitamins: Secondary | ICD-10-CM | POA: Diagnosis present

## 2018-01-18 DIAGNOSIS — Z90710 Acquired absence of both cervix and uterus: Secondary | ICD-10-CM

## 2018-01-18 DIAGNOSIS — D62 Acute posthemorrhagic anemia: Secondary | ICD-10-CM | POA: Diagnosis not present

## 2018-01-18 DIAGNOSIS — Z96643 Presence of artificial hip joint, bilateral: Secondary | ICD-10-CM | POA: Diagnosis present

## 2018-01-18 DIAGNOSIS — D72829 Elevated white blood cell count, unspecified: Secondary | ICD-10-CM | POA: Diagnosis present

## 2018-01-18 DIAGNOSIS — S7291XA Unspecified fracture of right femur, initial encounter for closed fracture: Secondary | ICD-10-CM | POA: Diagnosis present

## 2018-01-18 DIAGNOSIS — Z96651 Presence of right artificial knee joint: Secondary | ICD-10-CM | POA: Diagnosis present

## 2018-01-18 DIAGNOSIS — I10 Essential (primary) hypertension: Secondary | ICD-10-CM | POA: Diagnosis not present

## 2018-01-18 DIAGNOSIS — R634 Abnormal weight loss: Secondary | ICD-10-CM | POA: Diagnosis present

## 2018-01-18 DIAGNOSIS — Z23 Encounter for immunization: Secondary | ICD-10-CM

## 2018-01-18 DIAGNOSIS — G629 Polyneuropathy, unspecified: Secondary | ICD-10-CM | POA: Diagnosis present

## 2018-01-18 DIAGNOSIS — Z01811 Encounter for preprocedural respiratory examination: Secondary | ICD-10-CM

## 2018-01-18 DIAGNOSIS — R627 Adult failure to thrive: Secondary | ICD-10-CM | POA: Diagnosis present

## 2018-01-18 DIAGNOSIS — Z419 Encounter for procedure for purposes other than remedying health state, unspecified: Secondary | ICD-10-CM

## 2018-01-18 DIAGNOSIS — F0390 Unspecified dementia without behavioral disturbance: Secondary | ICD-10-CM | POA: Diagnosis present

## 2018-01-18 DIAGNOSIS — R55 Syncope and collapse: Secondary | ICD-10-CM | POA: Diagnosis present

## 2018-01-18 DIAGNOSIS — Z9049 Acquired absence of other specified parts of digestive tract: Secondary | ICD-10-CM | POA: Diagnosis not present

## 2018-01-18 DIAGNOSIS — I119 Hypertensive heart disease without heart failure: Secondary | ICD-10-CM | POA: Diagnosis present

## 2018-01-18 DIAGNOSIS — Z09 Encounter for follow-up examination after completed treatment for conditions other than malignant neoplasm: Secondary | ICD-10-CM

## 2018-01-18 DIAGNOSIS — M9711XA Periprosthetic fracture around internal prosthetic right knee joint, initial encounter: Principal | ICD-10-CM | POA: Diagnosis present

## 2018-01-18 DIAGNOSIS — Z79899 Other long term (current) drug therapy: Secondary | ICD-10-CM | POA: Diagnosis not present

## 2018-01-18 DIAGNOSIS — J449 Chronic obstructive pulmonary disease, unspecified: Secondary | ICD-10-CM | POA: Diagnosis present

## 2018-01-18 DIAGNOSIS — Z885 Allergy status to narcotic agent status: Secondary | ICD-10-CM

## 2018-01-18 DIAGNOSIS — W1830XA Fall on same level, unspecified, initial encounter: Secondary | ICD-10-CM | POA: Diagnosis present

## 2018-01-18 DIAGNOSIS — S72141D Displaced intertrochanteric fracture of right femur, subsequent encounter for closed fracture with routine healing: Secondary | ICD-10-CM | POA: Diagnosis not present

## 2018-01-18 DIAGNOSIS — S72331A Displaced oblique fracture of shaft of right femur, initial encounter for closed fracture: Secondary | ICD-10-CM | POA: Diagnosis present

## 2018-01-18 HISTORY — DX: Unspecified osteoarthritis, unspecified site: M19.90

## 2018-01-18 HISTORY — DX: Malignant neoplasm of unspecified site of left female breast: C50.912

## 2018-01-18 HISTORY — DX: Personal history of other diseases of the digestive system: Z87.19

## 2018-01-18 HISTORY — DX: Chronic obstructive pulmonary disease, unspecified: J44.9

## 2018-01-18 HISTORY — DX: Other amnesia: R41.3

## 2018-01-18 HISTORY — DX: Headache: R51

## 2018-01-18 HISTORY — DX: Gastro-esophageal reflux disease without esophagitis: K21.9

## 2018-01-18 HISTORY — DX: Pneumonia, unspecified organism: J18.9

## 2018-01-18 HISTORY — DX: Personal history of peptic ulcer disease: Z87.11

## 2018-01-18 HISTORY — DX: Unspecified chronic bronchitis: J42

## 2018-01-18 HISTORY — DX: Migraine, unspecified, not intractable, without status migrainosus: G43.909

## 2018-01-18 HISTORY — DX: Headache, unspecified: R51.9

## 2018-01-18 LAB — CBC WITH DIFFERENTIAL/PLATELET
Basophils Absolute: 0 10*3/uL (ref 0.0–0.1)
Basophils Relative: 0 %
EOS ABS: 0 10*3/uL (ref 0.0–0.7)
Eosinophils Relative: 0 %
HEMATOCRIT: 35.8 % — AB (ref 36.0–46.0)
Hemoglobin: 11.9 g/dL — ABNORMAL LOW (ref 12.0–15.0)
LYMPHS ABS: 0.9 10*3/uL (ref 0.7–4.0)
Lymphocytes Relative: 4 %
MCH: 40.8 pg — ABNORMAL HIGH (ref 26.0–34.0)
MCHC: 33.2 g/dL (ref 30.0–36.0)
MCV: 122.6 fL — ABNORMAL HIGH (ref 78.0–100.0)
MONO ABS: 0.9 10*3/uL (ref 0.1–1.0)
Monocytes Relative: 4 %
NEUTROS ABS: 20.9 10*3/uL — AB (ref 1.7–7.7)
Neutrophils Relative %: 92 %
Platelets: 359 10*3/uL (ref 150–400)
RBC: 2.92 MIL/uL — ABNORMAL LOW (ref 3.87–5.11)
RDW: 15.9 % — ABNORMAL HIGH (ref 11.5–15.5)
WBC: 22.7 10*3/uL — ABNORMAL HIGH (ref 4.0–10.5)

## 2018-01-18 LAB — BASIC METABOLIC PANEL
Anion gap: 11 (ref 5–15)
BUN: 8 mg/dL (ref 6–20)
CALCIUM: 8.6 mg/dL — AB (ref 8.9–10.3)
CO2: 27 mmol/L (ref 22–32)
Chloride: 100 mmol/L — ABNORMAL LOW (ref 101–111)
Creatinine, Ser: 0.67 mg/dL (ref 0.44–1.00)
GFR calc Af Amer: >60 mL/min (ref >60)
GFR calc non Af Amer: >60 mL/min (ref >60)
GLUCOSE: 117 mg/dL — AB (ref 65–99)
Potassium: 4 mmol/L (ref 3.5–5.1)
SODIUM: 138 mmol/L (ref 135–145)

## 2018-01-18 LAB — PROTIME-INR
INR: 1.2
Prothrombin Time: 15.1 seconds (ref 11.4–15.2)

## 2018-01-18 LAB — VITAMIN B12: Vitamin B-12: 668 pg/mL (ref 180–914)

## 2018-01-18 MED ORDER — ALBUTEROL SULFATE (2.5 MG/3ML) 0.083% IN NEBU: 3.0000 mL | INHALATION_SOLUTION | Freq: Four times a day (QID) | RESPIRATORY_TRACT | Status: DC | PRN

## 2018-01-18 MED ORDER — METHOCARBAMOL 1000 MG/10ML IJ SOLN
500.0000 mg | Freq: Four times a day (QID) | INTRAVENOUS | Status: DC | PRN
  Filled 2018-01-18 (×3): qty 5

## 2018-01-18 MED ORDER — FENTANYL CITRATE (PF) 100 MCG/2ML IJ SOLN
50.0000 ug | Freq: Once | INTRAMUSCULAR | Status: AC
  Administered 2018-01-18: 50 ug via INTRAVENOUS
  Filled 2018-01-18: qty 2

## 2018-01-18 MED ORDER — PNEUMOCOCCAL VAC POLYVALENT 25 MCG/0.5ML IJ INJ
0.5000 mL | INJECTION | INTRAMUSCULAR | Status: DC
  Filled 2018-01-18: qty 0.5

## 2018-01-18 MED ORDER — ACETAMINOPHEN 325 MG PO TABS
975.0000 mg | ORAL_TABLET | ORAL | Status: DC | PRN
  Administered 2018-01-22 (×2): 975 mg via ORAL
  Filled 2018-01-18 (×2): qty 3

## 2018-01-18 MED ORDER — CYANOCOBALAMIN 1000 MCG/ML IJ SOLN
1000.0000 ug | Freq: Once | INTRAMUSCULAR | Status: AC
  Administered 2018-01-20: 1000 ug via INTRAMUSCULAR
  Filled 2018-01-18: qty 1

## 2018-01-18 MED ORDER — CYANOCOBALAMIN 1000 MCG/ML IJ KIT: 1.0000 mL | PACK | INTRAMUSCULAR | Status: DC

## 2018-01-18 MED ORDER — POVIDONE-IODINE 10 % EX SWAB: 2.0000 "application " | Freq: Once | CUTANEOUS | Status: DC

## 2018-01-18 MED ORDER — CEFAZOLIN SODIUM-DEXTROSE 2-4 GM/100ML-% IV SOLN
2.0000 g | INTRAVENOUS | Status: AC
  Administered 2018-01-19: 2 g via INTRAVENOUS
  Filled 2018-01-18: qty 100

## 2018-01-18 MED ORDER — HEPARIN SODIUM (PORCINE) 5000 UNIT/ML IJ SOLN
5000.0000 [IU] | Freq: Three times a day (TID) | INTRAMUSCULAR | Status: DC
  Administered 2018-01-18: 5000 [IU] via SUBCUTANEOUS
  Filled 2018-01-18: qty 1

## 2018-01-18 MED ORDER — TRIAMTERENE-HCTZ 37.5-25 MG PO TABS
1.0000 | ORAL_TABLET | ORAL | Status: DC
  Administered 2018-01-19: 1 via ORAL
  Filled 2018-01-18: qty 1

## 2018-01-18 MED ORDER — METHOCARBAMOL 500 MG PO TABS
500.0000 mg | ORAL_TABLET | Freq: Four times a day (QID) | ORAL | Status: DC | PRN
  Administered 2018-01-18 – 2018-01-21 (×4): 500 mg via ORAL
  Filled 2018-01-18 (×5): qty 1

## 2018-01-18 MED ORDER — HEPARIN SODIUM (PORCINE) 5000 UNIT/ML IJ SOLN
5000.0000 [IU] | Freq: Three times a day (TID) | INTRAMUSCULAR | Status: AC
  Administered 2018-01-19: 5000 [IU] via SUBCUTANEOUS
  Filled 2018-01-18: qty 1

## 2018-01-18 MED ORDER — BENAZEPRIL HCL 20 MG PO TABS
20.0000 mg | ORAL_TABLET | Freq: Every day | ORAL | Status: DC
  Administered 2018-01-18 – 2018-01-22 (×5): 20 mg via ORAL
  Filled 2018-01-18 (×5): qty 1

## 2018-01-18 MED ORDER — SENNOSIDES-DOCUSATE SODIUM 8.6-50 MG PO TABS: 1.0000 | ORAL_TABLET | Freq: Every evening | ORAL | Status: DC | PRN

## 2018-01-18 MED ORDER — FENTANYL CITRATE (PF) 100 MCG/2ML IJ SOLN
50.0000 ug | INTRAMUSCULAR | Status: DC | PRN
  Administered 2018-01-18 – 2018-01-19 (×6): 50 ug via INTRAVENOUS
  Filled 2018-01-18 (×6): qty 2

## 2018-01-18 MED ORDER — CHLORHEXIDINE GLUCONATE 4 % EX LIQD: 60.0000 mL | Freq: Once | CUTANEOUS | Status: DC

## 2018-01-18 MED ORDER — CHLORHEXIDINE GLUCONATE 4 % EX LIQD
60.0000 mL | Freq: Once | CUTANEOUS | Status: AC
  Administered 2018-01-19: 4 via TOPICAL
  Filled 2018-01-18: qty 60

## 2018-01-18 MED ORDER — INFLUENZA VAC SPLIT HIGH-DOSE 0.5 ML IM SUSY
0.5000 mL | PREFILLED_SYRINGE | INTRAMUSCULAR | Status: DC
  Filled 2018-01-18 (×2): qty 0.5

## 2018-01-18 NOTE — Consult Note (Signed)
Reason for Consult:Right femur fx Referring Physician: E Schlossman  [name] is an 77 y.o. female.  HPI: [name] was at home ambulating with her RW when she became dizzy and fell. She describes the dizziness as a presyncopal feeling. The fall was unwitnessed but husband heard a thump from other room. She was unable to get up or ambulate after the fall. She was brought to the ED where x-rays showed a periprosthetic femur fx and orthopedic surgery was consulted.  Past Medical History:  Diagnosis Date  . Hypertension   . Thyroid disease     Past Surgical History:  Procedure Laterality Date  . ABDOMINAL HYSTERECTOMY  Partial  . APPENDECTOMY    . BACK SURGERY    . BREAST LUMPECTOMY Left   . CHOLECYSTECTOMY    . REPLACEMENT TOTAL KNEE  x 2  . WRIST SURGERY      No family history on file.  Social History:  reports that  has never smoked. she has never used smokeless tobacco. She reports that she does not drink alcohol or use drugs.  Allergies:  Allergies  Allergen Reactions  . Codeine Anaphylaxis    Medications: I have reviewed the patient's current medications.  No results found for this or any previous visit (from the past 48 hour(s)).  Ct Head Wo Contrast  Result Date: 01/18/2018 CLINICAL DATA:  Headache and neck pain. EXAM: CT HEAD WITHOUT CONTRAST CT CERVICAL SPINE WITHOUT CONTRAST TECHNIQUE: Multidetector CT imaging of the head and cervical spine was performed following the standard protocol without intravenous contrast. Multiplanar CT image reconstructions of the cervical spine were also generated. COMPARISON:  06/21/2009 MRI FINDINGS: CT HEAD FINDINGS Brain: Generalized atrophy. Chronic small-vessel ischemic changes affecting the pons, thalami, basal ganglia and hemispheric white matter. No sign of cortical or large vessel territory infarction. No definable acute insult. No mass lesion, hemorrhage, hydrocephalus or extra-axial collection. Vascular: There is  atherosclerotic calcification of the major vessels at the base of the brain. Skull: Negative Sinuses/Orbits: Ordinary mucosal thickening in the paranasal sinuses with a small amount of mucoid material in the left division of the sphenoid sinus. Orbits negative. Other: None CT CERVICAL SPINE FINDINGS Alignment: Chronic curvature convex to the left. Degenerative anterolisthesis at C4-5 of 3 mm. Skull base and vertebrae: Solid fusion at the posterior elements C2-3. No primary bone lesion. Conchae cavity of the superior endplate of T1 which was present on the previous MRI, therefore obviously chronic. No sign of acute bone injury. Soft tissues and spinal canal: Negative Disc levels: Ordinary osteoarthritis at C1-2. Facet arthropathy on the right at C3-4, C4-5 and C7-T1. Facet arthropathy on the left at C3-4 and C7-T1. Disc space narrowing with endplate osteophytes and bulging of the discs, most notable at C3-4 and C5-6. No likely critical canal or foraminal narrowing. Upper chest: No active process.  Mild scarring. Other: None IMPRESSION: Head CT: Advanced chronic small-vessel ischemic changes throughout the brain. No identifiable acute finding by CT. Cervical spine CT: Curvature and chronic degenerative changes throughout the region as outlined above. No acute finding. Electronically Signed   By: Nelson Chimes M.D.   On: 01/18/2018 11:29   Ct Cervical Spine Wo Contrast  Result Date: 01/18/2018 CLINICAL DATA:  Headache and neck pain. EXAM: CT HEAD WITHOUT CONTRAST CT CERVICAL SPINE WITHOUT CONTRAST TECHNIQUE: Multidetector CT imaging of the head and cervical spine was performed following the standard protocol without intravenous contrast. Multiplanar CT image reconstructions of the cervical spine were also generated. COMPARISON:  06/21/2009 MRI FINDINGS: CT HEAD FINDINGS Brain: Generalized atrophy. Chronic small-vessel ischemic changes affecting the pons, thalami, basal ganglia and hemispheric white matter. No sign of  cortical or large vessel territory infarction. No definable acute insult. No mass lesion, hemorrhage, hydrocephalus or extra-axial collection. Vascular: There is atherosclerotic calcification of the major vessels at the base of the brain. Skull: Negative Sinuses/Orbits: Ordinary mucosal thickening in the paranasal sinuses with a small amount of mucoid material in the left division of the sphenoid sinus. Orbits negative. Other: None CT CERVICAL SPINE FINDINGS Alignment: Chronic curvature convex to the left. Degenerative anterolisthesis at C4-5 of 3 mm. Skull base and vertebrae: Solid fusion at the posterior elements C2-3. No primary bone lesion. Conchae cavity of the superior endplate of T1 which was present on the previous MRI, therefore obviously chronic. No sign of acute bone injury. Soft tissues and spinal canal: Negative Disc levels: Ordinary osteoarthritis at C1-2. Facet arthropathy on the right at C3-4, C4-5 and C7-T1. Facet arthropathy on the left at C3-4 and C7-T1. Disc space narrowing with endplate osteophytes and bulging of the discs, most notable at C3-4 and C5-6. No likely critical canal or foraminal narrowing. Upper chest: No active process.  Mild scarring. Other: None IMPRESSION: Head CT: Advanced chronic small-vessel ischemic changes throughout the brain. No identifiable acute finding by CT. Cervical spine CT: Curvature and chronic degenerative changes throughout the region as outlined above. No acute finding. Electronically Signed   By: Nelson Chimes M.D.   On: 01/18/2018 11:29   Dg Hip Unilat W Or Wo Pelvis 2-3 Views Right  Result Date: 01/18/2018 CLINICAL DATA:  Right hip pain secondary to a fall today. Leg deformity. EXAM: DG HIP (WITH OR WITHOUT PELVIS) 2-3V RIGHT COMPARISON:  Radiographs dated 10/07/2005 FINDINGS: There is an overriding displaced spiral fracture of mid right femoral shaft. Diffuse osteopenia. No hip fracture. Right hip prosthesis is properly located. Visualized pelvic bones  are intact. IMPRESSION: 1. Displaced overriding spiral fracture of the right femoral shaft. 2. No acute abnormality of the right hip. Electronically Signed   By: Lorriane Shire M.D.   On: 01/18/2018 11:51   Dg Femur Min 2 Views Right  Result Date: 01/18/2018 CLINICAL DATA:  Right hip pain after fall. EXAM: RIGHT FEMUR 2 VIEWS COMPARISON:  None. FINDINGS: Status post right hip and knee arthroplasties. Severely displaced oblique fracture is seen involving the midportion of the right femoral shaft inferior to femoral prosthesis. IMPRESSION: Severely displaced oblique fracture involving right femoral shaft. Electronically Signed   By: Marijo Conception, M.D.   On: 01/18/2018 11:50    Review of Systems  Constitutional: Negative for weight loss.  HENT: Negative for ear discharge, ear pain, hearing loss and tinnitus.   Eyes: Negative for blurred vision, double vision, photophobia and pain.  Respiratory: Negative for cough, sputum production and shortness of breath.   Cardiovascular: Negative for chest pain.  Gastrointestinal: Negative for abdominal pain, nausea and vomiting.  Genitourinary: Negative for dysuria, flank pain, frequency and urgency.  Musculoskeletal: Positive for joint pain (Right thigh). Negative for back pain, falls, myalgias and neck pain.  Neurological: Positive for dizziness. Negative for tingling, sensory change, focal weakness, loss of consciousness and headaches.  Endo/Heme/Allergies: Does not bruise/bleed easily.  Psychiatric/Behavioral: Negative for depression, memory loss and substance abuse. The patient is not nervous/anxious.    Blood pressure (!) 159/89, pulse 95, temperature 98.3 F (36.8 C), temperature source Oral, resp. rate 18, SpO2 90 %. Physical Exam  Constitutional: She appears  well-developed and well-nourished. No distress.  HENT:  Head: Normocephalic and atraumatic.  Eyes: Conjunctivae are normal. Right eye exhibits no discharge. Left eye exhibits no discharge.  No scleral icterus.  Neck: Normal range of motion.  Cardiovascular: Normal rate and regular rhythm.  Respiratory: Effort normal. No respiratory distress.  Musculoskeletal:  RLE No traumatic wounds, ecchymosis, or rash  Moderate TTP thigh, externally rotated and short  No knee or ankle effusion  Knee stable to varus/ valgus and anterior/posterior stress  Sens DPN, SPN, TN intact  Motor EHL, ext, flex, evers 5/5  DP 2+, PT 2+, No significant edema  LLE No traumatic wounds, ecchymosis, or rash  Nontender  No knee or ankle effusion  Knee stable to varus/ valgus and anterior/posterior stress  Sens DPN, SPN, TN intact  Motor EHL, ext, flex, evers 5/5  DP 2+, PT 2+, No significant edema  Neurological: She is alert.  Skin: Skin is warm and dry. She is not diaphoretic.  Psychiatric: She has a normal mood and affect. Her behavior is normal.    Assessment/Plan: Fall Right periprosthetic femur fx -- Will place in [facility] traction tonight. Will need ORIF tomorrow by either Dr. Griffin Basil or Dr. Doreatha Martin most likely. Please keep NPO after midnight. Presyncope -- Will leave workup to primary team. IM to admit. Dementia HTN B12 deficiency -- Was being treated as OP by PCP, will check level FTT -- ~40# weight loss over several months to years    Lisette Abu, PA-C Orthopedic Surgery [phone] 01/18/2018, 12:23 PM

## 2018-01-18 NOTE — ED Triage Notes (Signed)
PTAR- pt coming from home after a fall in the restroom. Hx of bilateral hip replacements. Right leg deformity. 18g LAC. No medications given. 134/100, HR 90, RR 18, 94% on 2L. Pt AO per baseline, hx of dementia. CBG 185.

## 2018-01-18 NOTE — Progress Notes (Signed)
Orthopedic Tech Progress Note Patient Details:  [name] Apr 11, 1941 [id number]  Musculoskeletal Traction Type of Traction: [employer] Skin Traction Traction Location: RLE Traction Weight: 5 lbs   Post Interventions Patient Tolerated: Well Instructions Provided: Care of device   Braulio Bosch 01/18/2018, 6:05 PM

## 2018-01-18 NOTE — ED Notes (Signed)
Patient transported to CT 

## 2018-01-18 NOTE — H&P (View-Only) (Signed)
Reason for Consult:Right femur fx Referring Physician: E Schlossman  [name] is an 77 y.o. female.  HPI: Lonia was at home ambulating with her RW when she became dizzy and fell. She describes the dizziness as a presyncopal feeling. The fall was unwitnessed but husband heard a thump from other room. She was unable to get up or ambulate after the fall. She was brought to the ED where x-rays showed a periprosthetic femur fx and orthopedic surgery was consulted.  Past Medical History:  Diagnosis Date  . Hypertension   . Thyroid disease     Past Surgical History:  Procedure Laterality Date  . ABDOMINAL HYSTERECTOMY  Partial  . APPENDECTOMY    . BACK SURGERY    . BREAST LUMPECTOMY Left   . CHOLECYSTECTOMY    . REPLACEMENT TOTAL KNEE  x 2  . WRIST SURGERY      No family history on file.  Social History:  reports that  has never smoked. she has never used smokeless tobacco. She reports that she does not drink alcohol or use drugs.  Allergies:  Allergies  Allergen Reactions  . Codeine Anaphylaxis    Medications: I have reviewed the patient's current medications.  No results found for this or any previous visit (from the past 48 hour(s)).  Ct Head Wo Contrast  Result Date: 01/18/2018 CLINICAL DATA:  Headache and neck pain. EXAM: CT HEAD WITHOUT CONTRAST CT CERVICAL SPINE WITHOUT CONTRAST TECHNIQUE: Multidetector CT imaging of the head and cervical spine was performed following the standard protocol without intravenous contrast. Multiplanar CT image reconstructions of the cervical spine were also generated. COMPARISON:  06/21/2009 MRI FINDINGS: CT HEAD FINDINGS Brain: Generalized atrophy. Chronic small-vessel ischemic changes affecting the pons, thalami, basal ganglia and hemispheric white matter. No sign of cortical or large vessel territory infarction. No definable acute insult. No mass lesion, hemorrhage, hydrocephalus or extra-axial collection. Vascular: There is  atherosclerotic calcification of the major vessels at the base of the brain. Skull: Negative Sinuses/Orbits: Ordinary mucosal thickening in the paranasal sinuses with a small amount of mucoid material in the left division of the sphenoid sinus. Orbits negative. Other: None CT CERVICAL SPINE FINDINGS Alignment: Chronic curvature convex to the left. Degenerative anterolisthesis at C4-5 of 3 mm. Skull base and vertebrae: Solid fusion at the posterior elements C2-3. No primary bone lesion. Conchae cavity of the superior endplate of T1 which was present on the previous MRI, therefore obviously chronic. No sign of acute bone injury. Soft tissues and spinal canal: Negative Disc levels: Ordinary osteoarthritis at C1-2. Facet arthropathy on the right at C3-4, C4-5 and C7-T1. Facet arthropathy on the left at C3-4 and C7-T1. Disc space narrowing with endplate osteophytes and bulging of the discs, most notable at C3-4 and C5-6. No likely critical canal or foraminal narrowing. Upper chest: No active process.  Mild scarring. Other: None IMPRESSION: Head CT: Advanced chronic small-vessel ischemic changes throughout the brain. No identifiable acute finding by CT. Cervical spine CT: Curvature and chronic degenerative changes throughout the region as outlined above. No acute finding. Electronically Signed   By: Nelson Chimes M.D.   On: 01/18/2018 11:29   Ct Cervical Spine Wo Contrast  Result Date: 01/18/2018 CLINICAL DATA:  Headache and neck pain. EXAM: CT HEAD WITHOUT CONTRAST CT CERVICAL SPINE WITHOUT CONTRAST TECHNIQUE: Multidetector CT imaging of the head and cervical spine was performed following the standard protocol without intravenous contrast. Multiplanar CT image reconstructions of the cervical spine were also generated. COMPARISON:  06/21/2009 MRI FINDINGS: CT HEAD FINDINGS Brain: Generalized atrophy. Chronic small-vessel ischemic changes affecting the pons, thalami, basal ganglia and hemispheric white matter. No sign of  cortical or large vessel territory infarction. No definable acute insult. No mass lesion, hemorrhage, hydrocephalus or extra-axial collection. Vascular: There is atherosclerotic calcification of the major vessels at the base of the brain. Skull: Negative Sinuses/Orbits: Ordinary mucosal thickening in the paranasal sinuses with a small amount of mucoid material in the left division of the sphenoid sinus. Orbits negative. Other: None CT CERVICAL SPINE FINDINGS Alignment: Chronic curvature convex to the left. Degenerative anterolisthesis at C4-5 of 3 mm. Skull base and vertebrae: Solid fusion at the posterior elements C2-3. No primary bone lesion. Conchae cavity of the superior endplate of T1 which was present on the previous MRI, therefore obviously chronic. No sign of acute bone injury. Soft tissues and spinal canal: Negative Disc levels: Ordinary osteoarthritis at C1-2. Facet arthropathy on the right at C3-4, C4-5 and C7-T1. Facet arthropathy on the left at C3-4 and C7-T1. Disc space narrowing with endplate osteophytes and bulging of the discs, most notable at C3-4 and C5-6. No likely critical canal or foraminal narrowing. Upper chest: No active process.  Mild scarring. Other: None IMPRESSION: Head CT: Advanced chronic small-vessel ischemic changes throughout the brain. No identifiable acute finding by CT. Cervical spine CT: Curvature and chronic degenerative changes throughout the region as outlined above. No acute finding. Electronically Signed   By: Nelson Chimes M.D.   On: 01/18/2018 11:29   Dg Hip Unilat W Or Wo Pelvis 2-3 Views Right  Result Date: 01/18/2018 CLINICAL DATA:  Right hip pain secondary to a fall today. Leg deformity. EXAM: DG HIP (WITH OR WITHOUT PELVIS) 2-3V RIGHT COMPARISON:  Radiographs dated 10/07/2005 FINDINGS: There is an overriding displaced spiral fracture of mid right femoral shaft. Diffuse osteopenia. No hip fracture. Right hip prosthesis is properly located. Visualized pelvic bones  are intact. IMPRESSION: 1. Displaced overriding spiral fracture of the right femoral shaft. 2. No acute abnormality of the right hip. Electronically Signed   By: Lorriane Shire M.D.   On: 01/18/2018 11:51   Dg Femur Min 2 Views Right  Result Date: 01/18/2018 CLINICAL DATA:  Right hip pain after fall. EXAM: RIGHT FEMUR 2 VIEWS COMPARISON:  None. FINDINGS: Status post right hip and knee arthroplasties. Severely displaced oblique fracture is seen involving the midportion of the right femoral shaft inferior to femoral prosthesis. IMPRESSION: Severely displaced oblique fracture involving right femoral shaft. Electronically Signed   By: Marijo Conception, M.D.   On: 01/18/2018 11:50    Review of Systems  Constitutional: Negative for weight loss.  HENT: Negative for ear discharge, ear pain, hearing loss and tinnitus.   Eyes: Negative for blurred vision, double vision, photophobia and pain.  Respiratory: Negative for cough, sputum production and shortness of breath.   Cardiovascular: Negative for chest pain.  Gastrointestinal: Negative for abdominal pain, nausea and vomiting.  Genitourinary: Negative for dysuria, flank pain, frequency and urgency.  Musculoskeletal: Positive for joint pain (Right thigh). Negative for back pain, falls, myalgias and neck pain.  Neurological: Positive for dizziness. Negative for tingling, sensory change, focal weakness, loss of consciousness and headaches.  Endo/Heme/Allergies: Does not bruise/bleed easily.  Psychiatric/Behavioral: Negative for depression, memory loss and substance abuse. The patient is not nervous/anxious.    Blood pressure (!) 159/89, pulse 95, temperature 98.3 F (36.8 C), temperature source Oral, resp. rate 18, SpO2 90 %. Physical Exam  Constitutional: She appears  well-developed and well-nourished. No distress.  HENT:  Head: Normocephalic and atraumatic.  Eyes: Conjunctivae are normal. Right eye exhibits no discharge. Left eye exhibits no discharge.  No scleral icterus.  Neck: Normal range of motion.  Cardiovascular: Normal rate and regular rhythm.  Respiratory: Effort normal. No respiratory distress.  Musculoskeletal:  RLE No traumatic wounds, ecchymosis, or rash  Moderate TTP thigh, externally rotated and short  No knee or ankle effusion  Knee stable to varus/ valgus and anterior/posterior stress  Sens DPN, SPN, TN intact  Motor EHL, ext, flex, evers 5/5  DP 2+, PT 2+, No significant edema  LLE No traumatic wounds, ecchymosis, or rash  Nontender  No knee or ankle effusion  Knee stable to varus/ valgus and anterior/posterior stress  Sens DPN, SPN, TN intact  Motor EHL, ext, flex, evers 5/5  DP 2+, PT 2+, No significant edema  Neurological: She is alert.  Skin: Skin is warm and dry. She is not diaphoretic.  Psychiatric: She has a normal mood and affect. Her behavior is normal.    Assessment/Plan: Fall Right periprosthetic femur fx -- Will place in [employer] traction tonight. Will need ORIF tomorrow by either Dr. Griffin Basil or Dr. Doreatha Martin most likely. Please keep NPO after midnight. Presyncope -- Will leave workup to primary team. IM to admit. Dementia HTN B12 deficiency -- Was being treated as OP by PCP, will check level FTT -- ~40# weight loss over several months to years    Lisette Abu, PA-C Orthopedic Surgery [phone] 01/18/2018, 12:23 PM

## 2018-01-18 NOTE — ED Provider Notes (Signed)
[facility] Provider Note   CSN: [id number] Arrival date & time: 01/18/18  1003     History   Chief Complaint Chief Complaint  Patient presents with  . Fall    HPI [name] is a 77 y.o. female Past medical history of hypertension, thyroid disease who presents for evaluation after an unwitnessed fall that occurred this morning.  Husband reports that they were at home when patient started walking towards the bathroom with her walker.  Husband reports that he heard a thumping sound which he assumed was patient falling.  He went to find patient who is lying on the floor.  He states that patient was alert when he initially arrived.  He reports that patient fell and landed on her right lower extremity.  Has not been able to ambulate or bear weight on the right lower extremity since the incident.  Patient is not currently on any blood thinners.  Patient does have a history of bilateral hip replacements.  Last seen orthopedics 10 years ago.  Reports that patient is being evaluated for dementia has had she has had recent memory issues the last few years.  The history is provided by the patient.    Past Medical History:  Diagnosis Date  . Hypertension   . Memory difficulty   . Thyroid disease     Patient Active Problem List   Diagnosis Date Noted  . Femur fracture, right ([place]) 01/18/2018  . B12 deficiency 01/18/2018  . Hypertension   . Thyroid disease   . Dementia   . Memory difficulty     Past Surgical History:  Procedure Laterality Date  . ABDOMINAL HYSTERECTOMY  Partial  . APPENDECTOMY    . BACK SURGERY    . BREAST LUMPECTOMY Left   . CHOLECYSTECTOMY    . REPLACEMENT TOTAL KNEE  x 2  . WRIST SURGERY      OB History    No data available       Home Medications    Prior to Admission medications   Medication Sig Start Date End Date Taking? Authorizing Provider  acetaminophen (TYLENOL 8 HOUR) 650 MG CR tablet Take 1,300 mg  by mouth as needed for pain.   Yes [provider]  albuterol (PROVENTIL HFA;VENTOLIN HFA) 108 (90 BASE) MCG/ACT inhaler Inhale 2 puffs into the lungs every 6 (six) hours as needed for wheezing or shortness of breath.   Yes [provider]  benazepril (LOTENSIN) 20 MG tablet Take 20 mg by mouth daily. 01/03/18  Yes [provider]  Cyanocobalamin (B-12 COMPLIANCE INJECTION) 1000 MCG/ML KIT Inject 1 mL as directed once a week.   Yes [provider]  triamterene-hydrochlorothiazide (MAXZIDE-25) 37.5-25 MG tablet Take 1 tablet by mouth every morning. 01/03/18  Yes [provider]    Family History No family history on file.  Social History Social History   Tobacco Use  . Smoking status: Never Smoker  . Smokeless tobacco: Never Used  Substance Use Topics  . Alcohol use: No  . Drug use: No     Allergies   Codeine   Review of Systems Review of Systems  Unable to perform ROS: Dementia     Physical Exam Updated Vital Signs BP (!) 154/89   Pulse (!) 110   Temp 98.3 F (36.8 C) (Oral)   Resp 16   SpO2 98%   Physical Exam  Constitutional: She appears well-developed and well-nourished.  Frail and elderly appearing  HENT:  Head: Normocephalic and atraumatic.  Mouth/Throat: Oropharynx is clear and moist and mucous membranes are normal.  No tenderness to palpation of skull. No deformities or crepitus noted. No open wounds, abrasions or lacerations.   Eyes: Conjunctivae, EOM and lids are normal. Pupils are equal, round, and reactive to light.  Neck: Full passive range of motion without pain.  Cardiovascular: Normal rate, regular rhythm, normal heart sounds and normal pulses. Exam reveals no gallop and no friction rub.  No murmur heard. Pulses:      Radial pulses are 2+ on the right side, and 2+ on the left side.       Dorsalis pedis pulses are 2+ on the right side, and 2+ on the left side.  Pulmonary/Chest: Effort normal and breath  sounds normal.  Abdominal: Soft. Normal appearance. There is no tenderness. There is no rigidity and no guarding.  Musculoskeletal: Normal range of motion.  Tenderness palpation overlying the right hip and anterior right femur.  There is some deformity noted.  Right lower extremity is rotated and shortened.  No tenderness palpation the right knee, right ankle.  No abnormalities of the left lower extremity.  Limited range of motion of right lower extremity secondary to patient's pain.  Good flexion/extension of left lower extremity.  No abnormalities of bilateral upper extremities.  Neurological: She is alert.  Sensation intact along major nerve distributions of BLE  Skin: Skin is warm and dry. Capillary refill takes less than 2 seconds.  Good distal cap refill. RUE is not dusky in appearance or cool to touch.  Psychiatric: She has a normal mood and affect. Her speech is normal.  Nursing note and vitals reviewed.    ED Treatments / Results  Labs (all labs ordered are listed, but only abnormal results are displayed) Labs Reviewed  BASIC METABOLIC PANEL - Abnormal; Notable for the following components:      Result Value   Chloride 100 (*)    Glucose, Bld 117 (*)    Calcium 8.6 (*)    All other components within normal limits  CBC WITH DIFFERENTIAL/PLATELET - Abnormal; Notable for the following components:   WBC 22.7 (*)    RBC 2.92 (*)    Hemoglobin 11.9 (*)    HCT 35.8 (*)    MCV 122.6 (*)    MCH 40.8 (*)    RDW 15.9 (*)    Neutro Abs 20.9 (*)    All other components within normal limits  VITAMIN B12  PROTIME-INR    EKG  EKG Interpretation None       Radiology Ct Head Wo Contrast  Result Date: 01/18/2018 CLINICAL DATA:  Headache and neck pain. EXAM: CT HEAD WITHOUT CONTRAST CT CERVICAL SPINE WITHOUT CONTRAST TECHNIQUE: Multidetector CT imaging of the head and cervical spine was performed following the standard protocol without intravenous contrast. Multiplanar CT image  reconstructions of the cervical spine were also generated. COMPARISON:  06/21/2009 MRI FINDINGS: CT HEAD FINDINGS Brain: Generalized atrophy. Chronic small-vessel ischemic changes affecting the pons, thalami, basal ganglia and hemispheric white matter. No sign of cortical or large vessel territory infarction. No definable acute insult. No mass lesion, hemorrhage, hydrocephalus or extra-axial collection. Vascular: There is atherosclerotic calcification of the major vessels at the base of the brain. Skull: Negative Sinuses/Orbits: Ordinary mucosal thickening in the paranasal sinuses with a small amount of mucoid material in the left division of the sphenoid sinus. Orbits negative. Other: None CT CERVICAL SPINE FINDINGS Alignment: Chronic curvature convex to the left. Degenerative  anterolisthesis at C4-5 of 3 mm. Skull base and vertebrae: Solid fusion at the posterior elements C2-3. No primary bone lesion. Conchae cavity of the superior endplate of T1 which was present on the previous MRI, therefore obviously chronic. No sign of acute bone injury. Soft tissues and spinal canal: Negative Disc levels: Ordinary osteoarthritis at C1-2. Facet arthropathy on the right at C3-4, C4-5 and C7-T1. Facet arthropathy on the left at C3-4 and C7-T1. Disc space narrowing with endplate osteophytes and bulging of the discs, most notable at C3-4 and C5-6. No likely critical canal or foraminal narrowing. Upper chest: No active process.  Mild scarring. Other: None IMPRESSION: Head CT: Advanced chronic small-vessel ischemic changes throughout the brain. No identifiable acute finding by CT. Cervical spine CT: Curvature and chronic degenerative changes throughout the region as outlined above. No acute finding. Electronically Signed   By: Nelson Chimes M.D.   On: 01/18/2018 11:29   Ct Cervical Spine Wo Contrast  Result Date: 01/18/2018 CLINICAL DATA:  Headache and neck pain. EXAM: CT HEAD WITHOUT CONTRAST CT CERVICAL SPINE WITHOUT CONTRAST  TECHNIQUE: Multidetector CT imaging of the head and cervical spine was performed following the standard protocol without intravenous contrast. Multiplanar CT image reconstructions of the cervical spine were also generated. COMPARISON:  06/21/2009 MRI FINDINGS: CT HEAD FINDINGS Brain: Generalized atrophy. Chronic small-vessel ischemic changes affecting the pons, thalami, basal ganglia and hemispheric white matter. No sign of cortical or large vessel territory infarction. No definable acute insult. No mass lesion, hemorrhage, hydrocephalus or extra-axial collection. Vascular: There is atherosclerotic calcification of the major vessels at the base of the brain. Skull: Negative Sinuses/Orbits: Ordinary mucosal thickening in the paranasal sinuses with a small amount of mucoid material in the left division of the sphenoid sinus. Orbits negative. Other: None CT CERVICAL SPINE FINDINGS Alignment: Chronic curvature convex to the left. Degenerative anterolisthesis at C4-5 of 3 mm. Skull base and vertebrae: Solid fusion at the posterior elements C2-3. No primary bone lesion. Conchae cavity of the superior endplate of T1 which was present on the previous MRI, therefore obviously chronic. No sign of acute bone injury. Soft tissues and spinal canal: Negative Disc levels: Ordinary osteoarthritis at C1-2. Facet arthropathy on the right at C3-4, C4-5 and C7-T1. Facet arthropathy on the left at C3-4 and C7-T1. Disc space narrowing with endplate osteophytes and bulging of the discs, most notable at C3-4 and C5-6. No likely critical canal or foraminal narrowing. Upper chest: No active process.  Mild scarring. Other: None IMPRESSION: Head CT: Advanced chronic small-vessel ischemic changes throughout the brain. No identifiable acute finding by CT. Cervical spine CT: Curvature and chronic degenerative changes throughout the region as outlined above. No acute finding. Electronically Signed   By: Nelson Chimes M.D.   On: 01/18/2018 11:29    Chest Portable 1 View  Result Date: 01/18/2018 CLINICAL DATA:  Preop chest exam. EXAM: PORTABLE CHEST 1 VIEW COMPARISON:  Radiograph of June 10, 2014. FINDINGS: Stable cardiomegaly. Atherosclerosis of thoracic aorta is noted. New rounded density is noted medially in right lung base concerning for possible mass or neoplasm. No pneumothorax or pleural effusion is noted. No acute pulmonary disease is noted. Bony thorax is unremarkable. IMPRESSION: New rounded density seen medially in right lung base concerning for possible mass or neoplasm. CT scan of the chest is recommended for further evaluation. These results will be called to the ordering clinician or representative by the Radiologist Assistant, and communication documented in the PACS or zVision Dashboard. Aortic Atherosclerosis (  ICD10-I70.0). Electronically Signed   By: Marijo Conception, M.D.   On: 01/18/2018 13:35   Dg Hip Unilat W Or Wo Pelvis 2-3 Views Right  Result Date: 01/18/2018 CLINICAL DATA:  Right hip pain secondary to a fall today. Leg deformity. EXAM: DG HIP (WITH OR WITHOUT PELVIS) 2-3V RIGHT COMPARISON:  Radiographs dated 10/07/2005 FINDINGS: There is an overriding displaced spiral fracture of mid right femoral shaft. Diffuse osteopenia. No hip fracture. Right hip prosthesis is properly located. Visualized pelvic bones are intact. IMPRESSION: 1. Displaced overriding spiral fracture of the right femoral shaft. 2. No acute abnormality of the right hip. Electronically Signed   By: Lorriane Shire M.D.   On: 01/18/2018 11:51   Dg Femur Min 2 Views Right  Result Date: 01/18/2018 CLINICAL DATA:  Right hip pain after fall. EXAM: RIGHT FEMUR 2 VIEWS COMPARISON:  None. FINDINGS: Status post right hip and knee arthroplasties. Severely displaced oblique fracture is seen involving the midportion of the right femoral shaft inferior to femoral prosthesis. IMPRESSION: Severely displaced oblique fracture involving right femoral shaft. Electronically  Signed   By: Marijo Conception, M.D.   On: 01/18/2018 11:50    Procedures Procedures (including critical care time)  Medications Ordered in ED Medications  acetaminophen (TYLENOL) tablet 975 mg (not administered)  albuterol (PROVENTIL) (2.5 MG/3ML) 0.083% nebulizer solution 3 mL (not administered)  benazepril (LOTENSIN) tablet 20 mg (not administered)  triamterene-hydrochlorothiazide (MAXZIDE-25) 37.5-25 MG per tablet 1 tablet (not administered)  heparin injection 5,000 Units (not administered)  methocarbamol (ROBAXIN) tablet 500 mg (not administered)    Or  methocarbamol (ROBAXIN) 500 mg in dextrose 5 % 50 mL IVPB (not administered)  senna-docusate (Senokot-S) tablet 1 tablet (not administered)  fentaNYL (SUBLIMAZE) injection 50 mcg (not administered)  cyanocobalamin ((VITAMIN B-12)) injection 1,000 mcg (not administered)  fentaNYL (SUBLIMAZE) injection 50 mcg (50 mcg Intravenous Given 01/18/18 1102)  fentaNYL (SUBLIMAZE) injection 50 mcg (50 mcg Intravenous Given 01/18/18 1240)     Initial Impression / Assessment and Plan / ED Course  I have reviewed the triage vital signs and the nursing notes.  Pertinent labs & imaging results that were available during my care of the patient were reviewed by me and considered in my medical decision making (see chart for details).     78 y.o. F with PMH/o HTN, Thyroid disease who presents for right lower extremity pain after mechanical fall that occurred earlier today.  Husband reports that he heard patient fall.  Patient was not able to ambulate since the incident.  On exam, patient has tenderness palpation to the right thigh with notable deformity. Patient is neurovascularly intact. Patient is afebrile, non-toxic appearing, sitting comfortably on examination table. Vital signs reviewed and stable.  Consider fracture versus dislocation.  Will plan for basic labs, x-ray. Patient does have a history of bilateral prosthetic hips and reports that she was  seen by orthopedics Lucia Bitter 10 years ago.  She has had no follow-up since then.  X-ray reviewed.  There is an oblique fracture of the femoral shaft.  Hip prosthesis looks intact.  We will plan to consult orthopedic. CT head and CT C-spine are without any acute abnormality.  Discussed patient with Hilbert Odor (Ortho PA). Will come see patient in the ED.   Discussed with Ortho PA. Recommends medical admission with plans for surgical intervention.   CBC shows leukocytosis, likely Stress reaction.  Patient does have slight anemia.  Otherwise unremarkable.  BMP shows slight hyperglycemia otherwise unremarkable.  Discussed  with hospitalist. Will plan to admit.   Final Clinical Impressions(s) / ED Diagnoses   Final diagnoses:  Closed displaced oblique fracture of shaft of right femur, initial encounter [facility])    ED Discharge Orders    None       Volanda Napoleon, PA-C 01/18/18 1602    Gareth Morgan, MD 01/19/18 [phone]

## 2018-01-18 NOTE — H&P (Signed)
History and Physical    [name] TGG:[id number] DOB: 09-19-1941 DOA: 01/18/2018  PCP: Wenda Low, MD Patient coming from: home  Chief Complaint: fall   HPI: [name] is a 77 y.o. female with medical history significant for hypertension, thyroid disease, mild dementia presents to the emergency Department chief complaint of a fall resulting in right hip pain. Initial evaluation reveals right femur fracture. [facility] are asked to admit  Information is obtained from the patient and her family she is at the bedside. They report she got up this morning to the bathroom and she uses a walker. Husband reports hearing a thumping sound and he found the patient lying on the floor. He states the patient was alert. Patient states she hit her head but she did not lose consciousness. Husband reports she fell backwards and landed on her right lower extremity. She was unable to get up or move without excruciating pain. He MS was called she was transported to the emergency department. Patient does have a history of bilateral hip replacements. Has not seen orthopedics in 10 years. Sister reports she has had gradual functional decline over the last several months. Recently diagnosed with a vitamin B-12 deficiency has been going for her shots. Patient denies chest pain palpitation headache dizziness syncope or near-syncope. She denies any abdominal pain nausea vomiting diarrhea comes patient Miller bright red blood per rectum. She denies dysuria hematuria frequency or urgency.    ED Course: In emergency department she's afebrile hemodynamically stable and not hypoxic.  Review of Systems: As per HPI otherwise all other systems reviewed and are negative.   Ambulatory Status: Ambulates with a walker minimal assist with ADLs  Past Medical History:  Diagnosis Date  . Hypertension   . Memory difficulty   . Thyroid disease     Past Surgical History:  Procedure Laterality Date  .  ABDOMINAL HYSTERECTOMY  Partial  . APPENDECTOMY    . BACK SURGERY    . BREAST LUMPECTOMY Left   . CHOLECYSTECTOMY    . REPLACEMENT TOTAL KNEE  x 2  . WRIST SURGERY      Social History   Socioeconomic History  . Marital status: Married    Spouse name: Not on file  . Number of children: Not on file  . Years of education: Not on file  . Highest education level: Not on file  Social Needs  . Financial resource strain: Not on file  . Food insecurity - worry: Not on file  . Food insecurity - inability: Not on file  . Transportation needs - medical: Not on file  . Transportation needs - non-medical: Not on file  Occupational History  . Not on file  Tobacco Use  . Smoking status: Never Smoker  . Smokeless tobacco: Never Used  Substance and Sexual Activity  . Alcohol use: No  . Drug use: No  . Sexual activity: Not on file  Other Topics Concern  . Not on file  Social History Narrative  . Not on file    Allergies  Allergen Reactions  . Codeine Anaphylaxis    No family history on file. Family medical history reviewed and noncontributory to the admission of this elderly lady  Prior to Admission medications   Medication Sig Start Date End Date Taking? Authorizing Provider  acetaminophen (TYLENOL 8 HOUR) 650 MG CR tablet Take 1,300 mg by mouth as needed for pain.   Yes [provider]  albuterol (PROVENTIL HFA;VENTOLIN HFA) 108 (90 BASE) MCG/ACT  inhaler Inhale 2 puffs into the lungs every 6 (six) hours as needed for wheezing or shortness of breath.   Yes [provider]  benazepril (LOTENSIN) 20 MG tablet Take 20 mg by mouth daily. 01/03/18  Yes [provider]  Cyanocobalamin (B-12 COMPLIANCE INJECTION) 1000 MCG/ML KIT Inject 1 mL as directed once a week.   Yes [provider]  triamterene-hydrochlorothiazide (MAXZIDE-25) 37.5-25 MG tablet Take 1 tablet by mouth every morning. 01/03/18  Yes [provider]    Physical Exam: Vitals:    01/18/18 1100 01/18/18 1115 01/18/18 1145 01/18/18 1400  BP: (!) 141/107 (!) 159/89 (!) 149/81 (!) 155/69  Pulse: 79 95 89 (!) 102  Resp:    (!) 27  Temp:      TempSrc:      SpO2: 97% 90% 94% 98%     General:  Appears calm and slightly uncomfortable appears slightly pale frail Eyes:  PERRL, EOMI, normal lids, iris ENT:  grossly normal hearing, lips & tongue, his membranes of her mouth are pink but dry Neck:  no LAD, masses or thyromegaly Cardiovascular:  RRR, no m/r/g. No LE edema.  Respiratory:  CTA bilaterally, no w/r/r. Normal respiratory effort. Abdomen:  soft, ntnd, positive bowel sounds throughout no guarding or rebounding Skin:  no rash or induration seen on limited exam Musculoskeletal:  Right leg rotated and slightly shorter. Right hip tender to palpation. Decreased range of motion due to pain Psychiatric:  grossly normal mood and affect, speech fluent and appropriate, AOx3 Neurologic:  Alert and oriented 2. Speech clear facial symmetry  Labs on Admission: I have personally reviewed following labs and imaging studies  CBC: Recent Labs  Lab 01/18/18 1159  WBC 22.7*  NEUTROABS 20.9*  HGB 11.9*  HCT 35.8*  MCV 122.6*  PLT 017   Basic Metabolic Panel: Recent Labs  Lab 01/18/18 1159  NA 138  K 4.0  CL 100*  CO2 27  GLUCOSE 117*  BUN 8  CREATININE 0.67  CALCIUM 8.6*   GFR: CrCl cannot be calculated (Unknown ideal weight.). Liver Function Tests: No results for input(s): AST, ALT, ALKPHOS, BILITOT, PROT, ALBUMIN in the last 168 hours. No results for input(s): LIPASE, AMYLASE in the last 168 hours. No results for input(s): AMMONIA in the last 168 hours. Coagulation Profile: No results for input(s): INR, PROTIME in the last 168 hours. Cardiac Enzymes: No results for input(s): CKTOTAL, CKMB, CKMBINDEX, TROPONINI in the last 168 hours. BNP (last 3 results) No results for input(s): PROBNP in the last 8760 hours. HbA1C: No results for input(s): HGBA1C in the  last 72 hours. CBG: No results for input(s): GLUCAP in the last 168 hours. Lipid Profile: No results for input(s): CHOL, HDL, LDLCALC, TRIG, CHOLHDL, LDLDIRECT in the last 72 hours. Thyroid Function Tests: No results for input(s): TSH, T4TOTAL, FREET4, T3FREE, THYROIDAB in the last 72 hours. Anemia Panel: No results for input(s): VITAMINB12, FOLATE, FERRITIN, TIBC, IRON, RETICCTPCT in the last 72 hours. Urine analysis: No results found for: COLORURINE, APPEARANCEUR, LABSPEC, PHURINE, GLUCOSEU, HGBUR, BILIRUBINUR, KETONESUR, PROTEINUR, UROBILINOGEN, NITRITE, LEUKOCYTESUR  Creatinine Clearance: CrCl cannot be calculated (Unknown ideal weight.).  Sepsis Labs: '@LABRCNTIP' (procalcitonin:4,lacticidven:4) )No results found for this or any previous visit (from the past 240 hour(s)).   Radiological Exams on Admission: Ct Head Wo Contrast  Result Date: 01/18/2018 CLINICAL DATA:  Headache and neck pain. EXAM: CT HEAD WITHOUT CONTRAST CT CERVICAL SPINE WITHOUT CONTRAST TECHNIQUE: Multidetector CT imaging of the head and cervical spine was performed following  the standard protocol without intravenous contrast. Multiplanar CT image reconstructions of the cervical spine were also generated. COMPARISON:  06/21/2009 MRI FINDINGS: CT HEAD FINDINGS Brain: Generalized atrophy. Chronic small-vessel ischemic changes affecting the pons, thalami, basal ganglia and hemispheric white matter. No sign of cortical or large vessel territory infarction. No definable acute insult. No mass lesion, hemorrhage, hydrocephalus or extra-axial collection. Vascular: There is atherosclerotic calcification of the major vessels at the base of the brain. Skull: Negative Sinuses/Orbits: Ordinary mucosal thickening in the paranasal sinuses with a small amount of mucoid material in the left division of the sphenoid sinus. Orbits negative. Other: None CT CERVICAL SPINE FINDINGS Alignment: Chronic curvature convex to the left. Degenerative  anterolisthesis at C4-5 of 3 mm. Skull base and vertebrae: Solid fusion at the posterior elements C2-3. No primary bone lesion. Conchae cavity of the superior endplate of T1 which was present on the previous MRI, therefore obviously chronic. No sign of acute bone injury. Soft tissues and spinal canal: Negative Disc levels: Ordinary osteoarthritis at C1-2. Facet arthropathy on the right at C3-4, C4-5 and C7-T1. Facet arthropathy on the left at C3-4 and C7-T1. Disc space narrowing with endplate osteophytes and bulging of the discs, most notable at C3-4 and C5-6. No likely critical canal or foraminal narrowing. Upper chest: No active process.  Mild scarring. Other: None IMPRESSION: Head CT: Advanced chronic small-vessel ischemic changes throughout the brain. No identifiable acute finding by CT. Cervical spine CT: Curvature and chronic degenerative changes throughout the region as outlined above. No acute finding. Electronically Signed   By: Nelson Chimes M.D.   On: 01/18/2018 11:29   Ct Cervical Spine Wo Contrast  Result Date: 01/18/2018 CLINICAL DATA:  Headache and neck pain. EXAM: CT HEAD WITHOUT CONTRAST CT CERVICAL SPINE WITHOUT CONTRAST TECHNIQUE: Multidetector CT imaging of the head and cervical spine was performed following the standard protocol without intravenous contrast. Multiplanar CT image reconstructions of the cervical spine were also generated. COMPARISON:  06/21/2009 MRI FINDINGS: CT HEAD FINDINGS Brain: Generalized atrophy. Chronic small-vessel ischemic changes affecting the pons, thalami, basal ganglia and hemispheric white matter. No sign of cortical or large vessel territory infarction. No definable acute insult. No mass lesion, hemorrhage, hydrocephalus or extra-axial collection. Vascular: There is atherosclerotic calcification of the major vessels at the base of the brain. Skull: Negative Sinuses/Orbits: Ordinary mucosal thickening in the paranasal sinuses with a small amount of mucoid material  in the left division of the sphenoid sinus. Orbits negative. Other: None CT CERVICAL SPINE FINDINGS Alignment: Chronic curvature convex to the left. Degenerative anterolisthesis at C4-5 of 3 mm. Skull base and vertebrae: Solid fusion at the posterior elements C2-3. No primary bone lesion. Conchae cavity of the superior endplate of T1 which was present on the previous MRI, therefore obviously chronic. No sign of acute bone injury. Soft tissues and spinal canal: Negative Disc levels: Ordinary osteoarthritis at C1-2. Facet arthropathy on the right at C3-4, C4-5 and C7-T1. Facet arthropathy on the left at C3-4 and C7-T1. Disc space narrowing with endplate osteophytes and bulging of the discs, most notable at C3-4 and C5-6. No likely critical canal or foraminal narrowing. Upper chest: No active process.  Mild scarring. Other: None IMPRESSION: Head CT: Advanced chronic small-vessel ischemic changes throughout the brain. No identifiable acute finding by CT. Cervical spine CT: Curvature and chronic degenerative changes throughout the region as outlined above. No acute finding. Electronically Signed   By: Nelson Chimes M.D.   On: 01/18/2018 11:29   Chest  Portable 1 View  Result Date: 01/18/2018 CLINICAL DATA:  Preop chest exam. EXAM: PORTABLE CHEST 1 VIEW COMPARISON:  Radiograph of June 10, 2014. FINDINGS: Stable cardiomegaly. Atherosclerosis of thoracic aorta is noted. New rounded density is noted medially in right lung base concerning for possible mass or neoplasm. No pneumothorax or pleural effusion is noted. No acute pulmonary disease is noted. Bony thorax is unremarkable. IMPRESSION: New rounded density seen medially in right lung base concerning for possible mass or neoplasm. CT scan of the chest is recommended for further evaluation. These results will be called to the ordering clinician or representative by the Radiologist Assistant, and communication documented in the PACS or zVision Dashboard. Aortic  Atherosclerosis (ICD10-I70.0). Electronically Signed   By: Marijo Conception, M.D.   On: 01/18/2018 13:35   Dg Hip Unilat W Or Wo Pelvis 2-3 Views Right  Result Date: 01/18/2018 CLINICAL DATA:  Right hip pain secondary to a fall today. Leg deformity. EXAM: DG HIP (WITH OR WITHOUT PELVIS) 2-3V RIGHT COMPARISON:  Radiographs dated 10/07/2005 FINDINGS: There is an overriding displaced spiral fracture of mid right femoral shaft. Diffuse osteopenia. No hip fracture. Right hip prosthesis is properly located. Visualized pelvic bones are intact. IMPRESSION: 1. Displaced overriding spiral fracture of the right femoral shaft. 2. No acute abnormality of the right hip. Electronically Signed   By: Lorriane Shire M.D.   On: 01/18/2018 11:51   Dg Femur Min 2 Views Right  Result Date: 01/18/2018 CLINICAL DATA:  Right hip pain after fall. EXAM: RIGHT FEMUR 2 VIEWS COMPARISON:  None. FINDINGS: Status post right hip and knee arthroplasties. Severely displaced oblique fracture is seen involving the midportion of the right femoral shaft inferior to femoral prosthesis. IMPRESSION: Severely displaced oblique fracture involving right femoral shaft. Electronically Signed   By: Marijo Conception, M.D.   On: 01/18/2018 11:50    EKG: Sinus tachycardia Borderline ST depression, diffuse leads  Assessment/Plan Principal Problem:   Femur fracture, right (HCC) Active Problems:   Hypertension   Thyroid disease   Memory difficulty   B12 deficiency  #1. Femur fracture as result of fall. Patient reports mechanical fall. No loss of consciousness but she did hit her head. Is not on blood thinners. Evaluated by orthopedics who indicate repair tomorrow -Admit to MedSurg -Chest x-ray -INR -EKG -Nothing by mouth past midnight -Pain management  #2. Hypertension. Blood pressure high end of normal in the emergency department. Home medications include benazepril, chlorothiazide, triamterene -Continue home meds -Monitor  #3. B-12  deficiency. Patient recently diagnosed with B-12 deficiency. Home medications include weekly injections. -Continue home injections as indicated  #4. Thyroid disease. -Check a TSH   DVT prophylaxis: heparin Code Status: full  Family Communication: husband at bedside  Disposition Plan: snf  Consults called: micheal jeffrey ortho Admission status: inpateint    Radene Gunning MD [facility]  If 7PM-7AM, please contact night-coverage [email or web address] Password [email or web address]  01/18/2018, 3:00 PM

## 2018-01-19 ENCOUNTER — Encounter (HOSPITAL_COMMUNITY)
Admission: EM | Disposition: A | Discharge status: Skilled Nursing Facility | Payer: Self-pay | Source: Home / Self Care | Attending: Internal Medicine

## 2018-01-19 ENCOUNTER — Inpatient Hospital Stay (HOSPITAL_COMMUNITY): Payer: Medicare Other | Admitting: Anesthesiology

## 2018-01-19 ENCOUNTER — Inpatient Hospital Stay (HOSPITAL_COMMUNITY): Payer: Medicare Other

## 2018-01-19 ENCOUNTER — Encounter (HOSPITAL_COMMUNITY): Payer: Self-pay

## 2018-01-19 DIAGNOSIS — S72141D Displaced intertrochanteric fracture of right femur, subsequent encounter for closed fracture with routine healing: Secondary | ICD-10-CM

## 2018-01-19 DIAGNOSIS — E538 Deficiency of other specified B group vitamins: Secondary | ICD-10-CM

## 2018-01-19 HISTORY — PX: ORIF FEMUR FRACTURE: SHX2119

## 2018-01-19 LAB — CBC
HCT: 34.5 % — ABNORMAL LOW (ref 36.0–46.0)
HEMOGLOBIN: 11.4 g/dL — AB (ref 12.0–15.0)
MCH: 41.3 pg — ABNORMAL HIGH (ref 26.0–34.0)
MCHC: 33 g/dL (ref 30.0–36.0)
MCV: 125 fL — ABNORMAL HIGH (ref 78.0–100.0)
Platelets: 398 10*3/uL (ref 150–400)
RBC: 2.76 MIL/uL — ABNORMAL LOW (ref 3.87–5.11)
RDW: 15.4 % (ref 11.5–15.5)
WBC: 14 10*3/uL — AB (ref 4.0–10.5)

## 2018-01-19 LAB — BASIC METABOLIC PANEL
Anion gap: 14 (ref 5–15)
BUN: 10 mg/dL (ref 6–20)
CALCIUM: 8.8 mg/dL — AB (ref 8.9–10.3)
CHLORIDE: 99 mmol/L — AB (ref 101–111)
CO2: 27 mmol/L (ref 22–32)
CREATININE: 0.72 mg/dL (ref 0.44–1.00)
GFR calc non Af Amer: >60 mL/min (ref >60)
Glucose, Bld: 78 mg/dL (ref 65–99)
Potassium: 3.9 mmol/L (ref 3.5–5.1)
SODIUM: 140 mmol/L (ref 135–145)

## 2018-01-19 LAB — TSH: TSH: 2.417 u[IU]/mL (ref 0.350–4.500)

## 2018-01-19 LAB — SURGICAL PCR SCREEN
MRSA, PCR: NEGATIVE
STAPHYLOCOCCUS AUREUS: NEGATIVE

## 2018-01-19 SURGERY — OPEN REDUCTION INTERNAL FIXATION (ORIF) DISTAL FEMUR FRACTURE
Anesthesia: General | Site: Leg Upper | Laterality: Right

## 2018-01-19 MED ORDER — CEFAZOLIN SODIUM-DEXTROSE 2-4 GM/100ML-% IV SOLN
2.0000 g | Freq: Four times a day (QID) | INTRAVENOUS | Status: AC
  Administered 2018-01-19 – 2018-01-20 (×2): 2 g via INTRAVENOUS
  Filled 2018-01-19 (×2): qty 100

## 2018-01-19 MED ORDER — PHENOL 1.4 % MT LIQD: 1.0000 | OROMUCOSAL | Status: DC | PRN

## 2018-01-19 MED ORDER — BUPIVACAINE HCL (PF) 0.25 % IJ SOLN
INTRAMUSCULAR | Status: DC | PRN
  Administered 2018-01-19 (×2): 10 mL

## 2018-01-19 MED ORDER — VANCOMYCIN HCL 1000 MG IV SOLR
INTRAVENOUS | Status: AC
  Filled 2018-01-19: qty 1000

## 2018-01-19 MED ORDER — PROPOFOL 10 MG/ML IV BOLUS
INTRAVENOUS | Status: DC | PRN
  Administered 2018-01-19: 100 mg via INTRAVENOUS

## 2018-01-19 MED ORDER — EPHEDRINE SULFATE 50 MG/ML IJ SOLN
INTRAMUSCULAR | Status: DC | PRN
  Administered 2018-01-19 (×2): 10 mg via INTRAVENOUS

## 2018-01-19 MED ORDER — FENTANYL CITRATE (PF) 100 MCG/2ML IJ SOLN
INTRAMUSCULAR | Status: DC | PRN
  Administered 2018-01-19: 100 ug via INTRAVENOUS
  Administered 2018-01-19: 50 ug via INTRAVENOUS
  Administered 2018-01-19: 100 ug via INTRAVENOUS

## 2018-01-19 MED ORDER — ONDANSETRON HCL 4 MG/2ML IJ SOLN
INTRAMUSCULAR | Status: DC | PRN
  Administered 2018-01-19: 4 mg via INTRAVENOUS

## 2018-01-19 MED ORDER — MIDAZOLAM HCL 5 MG/5ML IJ SOLN
INTRAMUSCULAR | Status: DC | PRN
  Administered 2018-01-19: 0.5 mg via INTRAVENOUS

## 2018-01-19 MED ORDER — ONDANSETRON HCL 4 MG/2ML IJ SOLN: 4.0000 mg | Freq: Once | INTRAMUSCULAR | Status: DC | PRN

## 2018-01-19 MED ORDER — MENTHOL 3 MG MT LOZG: 1.0000 | LOZENGE | OROMUCOSAL | Status: DC | PRN

## 2018-01-19 MED ORDER — LIDOCAINE HCL (CARDIAC) 20 MG/ML IV SOLN
INTRAVENOUS | Status: DC | PRN
  Administered 2018-01-19: 60 mg via INTRAVENOUS

## 2018-01-19 MED ORDER — ROCURONIUM BROMIDE 100 MG/10ML IV SOLN
INTRAVENOUS | Status: DC | PRN
  Administered 2018-01-19: 70 mg via INTRAVENOUS

## 2018-01-19 MED ORDER — DOCUSATE SODIUM 100 MG PO CAPS
100.0000 mg | ORAL_CAPSULE | Freq: Two times a day (BID) | ORAL | Status: DC
  Administered 2018-01-19 – 2018-01-22 (×6): 100 mg via ORAL
  Filled 2018-01-19 (×6): qty 1

## 2018-01-19 MED ORDER — METOCLOPRAMIDE HCL 5 MG PO TABS: 5.0000 mg | ORAL_TABLET | Freq: Three times a day (TID) | ORAL | Status: DC | PRN

## 2018-01-19 MED ORDER — ONDANSETRON HCL 4 MG/2ML IJ SOLN
4.0000 mg | Freq: Four times a day (QID) | INTRAMUSCULAR | Status: DC | PRN
Start: 2018-01-19 — End: 2018-01-22

## 2018-01-19 MED ORDER — BUPIVACAINE HCL (PF) 0.25 % IJ SOLN
INTRAMUSCULAR | Status: AC
  Filled 2018-01-19: qty 10

## 2018-01-19 MED ORDER — LACTATED RINGERS IV SOLN
INTRAVENOUS | Status: DC
  Administered 2018-01-19 (×3): via INTRAVENOUS

## 2018-01-19 MED ORDER — FENTANYL CITRATE (PF) 100 MCG/2ML IJ SOLN
25.0000 ug | INTRAMUSCULAR | Status: DC | PRN
  Administered 2018-01-19: 50 ug via INTRAVENOUS

## 2018-01-19 MED ORDER — PHENYLEPHRINE HCL 10 MG/ML IJ SOLN
INTRAMUSCULAR | Status: DC | PRN
  Administered 2018-01-19 (×3): 80 ug via INTRAVENOUS

## 2018-01-19 MED ORDER — ONDANSETRON HCL 4 MG PO TABS: 4.0000 mg | ORAL_TABLET | Freq: Four times a day (QID) | ORAL | Status: DC | PRN

## 2018-01-19 MED ORDER — SUGAMMADEX SODIUM 200 MG/2ML IV SOLN
INTRAVENOUS | Status: DC | PRN
  Administered 2018-01-19: 200 mg via INTRAVENOUS

## 2018-01-19 MED ORDER — VANCOMYCIN HCL 1000 MG IV SOLR
INTRAVENOUS | Status: DC | PRN
  Administered 2018-01-19: 1000 mg

## 2018-01-19 MED ORDER — ALBUMIN HUMAN 5 % IV SOLN
INTRAVENOUS | Status: DC | PRN
  Administered 2018-01-19 (×2): via INTRAVENOUS

## 2018-01-19 MED ORDER — FENTANYL CITRATE (PF) 100 MCG/2ML IJ SOLN
INTRAMUSCULAR | Status: AC
  Administered 2018-01-19: 50 ug via INTRAVENOUS
  Filled 2018-01-19: qty 2

## 2018-01-19 MED ORDER — MIDAZOLAM HCL 2 MG/2ML IJ SOLN
INTRAMUSCULAR | Status: AC
  Filled 2018-01-19: qty 2

## 2018-01-19 MED ORDER — METOCLOPRAMIDE HCL 5 MG/ML IJ SOLN: 5.0000 mg | Freq: Three times a day (TID) | INTRAMUSCULAR | Status: DC | PRN

## 2018-01-19 MED ORDER — ACETAMINOPHEN 500 MG PO TABS
1000.0000 mg | ORAL_TABLET | Freq: Four times a day (QID) | ORAL | Status: AC
  Administered 2018-01-19 – 2018-01-20 (×3): 1000 mg via ORAL
  Filled 2018-01-19 (×3): qty 2

## 2018-01-19 MED ORDER — ADULT MULTIVITAMIN W/MINERALS CH
1.0000 | ORAL_TABLET | Freq: Every day | ORAL | Status: DC
  Administered 2018-01-20 – 2018-01-22 (×3): 1 via ORAL
  Filled 2018-01-19 (×4): qty 1

## 2018-01-19 MED ORDER — FENTANYL CITRATE (PF) 250 MCG/5ML IJ SOLN
INTRAMUSCULAR | Status: AC
  Filled 2018-01-19: qty 5

## 2018-01-19 MED ORDER — CELECOXIB 200 MG PO CAPS
200.0000 mg | ORAL_CAPSULE | Freq: Two times a day (BID) | ORAL | Status: DC
  Administered 2018-01-19 – 2018-01-22 (×6): 200 mg via ORAL
  Filled 2018-01-19 (×6): qty 1

## 2018-01-19 MED ORDER — TRAMADOL HCL 50 MG PO TABS
100.0000 mg | ORAL_TABLET | Freq: Four times a day (QID) | ORAL | Status: DC | PRN
  Administered 2018-01-20: 100 mg via ORAL
  Filled 2018-01-19 (×2): qty 2

## 2018-01-19 MED ORDER — PHENYLEPHRINE HCL 10 MG/ML IJ SOLN
INTRAVENOUS | Status: DC | PRN
  Administered 2018-01-19: 40 ug/min via INTRAVENOUS

## 2018-01-19 MED ORDER — ENSURE ENLIVE PO LIQD
237.0000 mL | Freq: Three times a day (TID) | ORAL | Status: DC
  Administered 2018-01-20 – 2018-01-21 (×4): 237 mL via ORAL

## 2018-01-19 MED ORDER — ENOXAPARIN SODIUM 40 MG/0.4ML ~~LOC~~ SOLN
40.0000 mg | SUBCUTANEOUS | Status: DC
  Administered 2018-01-20 – 2018-01-22 (×3): 40 mg via SUBCUTANEOUS
  Filled 2018-01-19 (×3): qty 0.4

## 2018-01-19 MED ORDER — 0.9 % SODIUM CHLORIDE (POUR BTL) OPTIME
TOPICAL | Status: DC | PRN
  Administered 2018-01-19: 1000 mL

## 2018-01-19 SURGICAL SUPPLY — 65 items
BANDAGE ACE 6X5 VEL STRL LF (GAUZE/BANDAGES/DRESSINGS) ×3 IMPLANT
BIT DRILL 4.3 (BIT) ×1 IMPLANT
BIT DRILL GOLD 3.5MM (BIT) ×1 IMPLANT
BIT DRILL QC 3.3X195 (BIT) ×3 IMPLANT
BLADE CLIPPER SURG (BLADE) IMPLANT
BNDG GAUZE ELAST 4 BULKY (GAUZE/BANDAGES/DRESSINGS) ×3 IMPLANT
CAP LOCK NCB (Cap) ×21 IMPLANT
CHLORAPREP W/TINT 26ML (MISCELLANEOUS) ×9 IMPLANT
CLOSURE STERI-STRIP 1/2X4 (GAUZE/BANDAGES/DRESSINGS) ×2
CLSR STERI-STRIP ANTIMIC 1/2X4 (GAUZE/BANDAGES/DRESSINGS) ×4 IMPLANT
DRAPE C-ARM 42X72 X-RAY (DRAPES) ×3 IMPLANT
DRAPE C-ARMOR (DRAPES) ×3 IMPLANT
DRAPE IMP U-DRAPE 54X76 (DRAPES) ×6 IMPLANT
DRAPE ORTHO SPLIT 77X108 STRL (DRAPES) ×4
DRAPE SURG ORHT 6 SPLT 77X108 (DRAPES) ×2 IMPLANT
DRAPE U-SHAPE 47X51 STRL (DRAPES) ×3 IMPLANT
DRILL BIT 4.3 (BIT) ×2
DRILL GOLD 3.5MM (BIT) ×3
DRSG AQUACEL AG ADV 3.5X14 (GAUZE/BANDAGES/DRESSINGS) ×3 IMPLANT
DRSG PAD ABDOMINAL 8X10 ST (GAUZE/BANDAGES/DRESSINGS) ×6 IMPLANT
ELECT REM PT RETURN 9FT ADLT (ELECTROSURGICAL) ×3
ELECTRODE REM PT RTRN 9FT ADLT (ELECTROSURGICAL) ×1 IMPLANT
GAUZE SPONGE 4X4 12PLY STRL (GAUZE/BANDAGES/DRESSINGS) ×3 IMPLANT
GAUZE XEROFORM 1X8 LF (GAUZE/BANDAGES/DRESSINGS) ×3 IMPLANT
GLOVE BIOGEL PI IND STRL 8 (GLOVE) ×1 IMPLANT
GLOVE BIOGEL PI INDICATOR 8 (GLOVE) ×2
GLOVE ECLIPSE 8.0 STRL XLNG CF (GLOVE) ×6 IMPLANT
GOWN STRL REUS W/ TWL LRG LVL3 (GOWN DISPOSABLE) ×2 IMPLANT
GOWN STRL REUS W/ TWL XL LVL3 (GOWN DISPOSABLE) ×2 IMPLANT
GOWN STRL REUS W/TWL LRG LVL3 (GOWN DISPOSABLE) ×4
GOWN STRL REUS W/TWL XL LVL3 (GOWN DISPOSABLE) ×4
KIT BASIN OR (CUSTOM PROCEDURE TRAY) ×3 IMPLANT
KIT ROOM TURNOVER OR (KITS) ×3 IMPLANT
MANIFOLD NEPTUNE II (INSTRUMENTS) ×3 IMPLANT
NS IRRIG 1000ML POUR BTL (IV SOLUTION) ×3 IMPLANT
PACK TOTAL JOINT (CUSTOM PROCEDURE TRAY) ×3 IMPLANT
PACK UNIVERSAL I (CUSTOM PROCEDURE TRAY) ×3 IMPLANT
PAD ARMBOARD 7.5X6 YLW CONV (MISCELLANEOUS) ×6 IMPLANT
PAD CAST 4YDX4 CTTN HI CHSV (CAST SUPPLIES) ×1 IMPLANT
PADDING CAST COTTON 4X4 STRL (CAST SUPPLIES) ×2
PADDING CAST COTTON 6X4 STRL (CAST SUPPLIES) ×3 IMPLANT
PLAT CV FEMUR SHFT 14H 289 (Plate) ×3 IMPLANT
PLATE CV FEMUR SHFT 14H 289 (Plate) ×1 IMPLANT
SCREW 5.0 32MM (Screw) ×9 IMPLANT
SCREW NCB 4.0MX30M (Screw) ×6 IMPLANT
SCREW NCB 4.0X36MM (Screw) ×6 IMPLANT
SCREW NCB 5.0X30MM (Screw) ×3 IMPLANT
SCREW NCB 5.0X34MM (Screw) ×3 IMPLANT
STAPLER VISISTAT 35W (STAPLE) ×3 IMPLANT
SUT MNCRL AB 3-0 PS2 18 (SUTURE) ×3 IMPLANT
SUT MON AB 3-0 SH 27 (SUTURE) ×2
SUT MON AB 3-0 SH27 (SUTURE) ×1 IMPLANT
SUT TIGER TAPE 7 IN WHITE (SUTURE) ×3 IMPLANT
SUT VIC AB 0 CT1 18XCR BRD 8 (SUTURE) ×1 IMPLANT
SUT VIC AB 0 CT1 27 (SUTURE) ×6
SUT VIC AB 0 CT1 27XBRD ANBCTR (SUTURE) ×3 IMPLANT
SUT VIC AB 0 CT1 8-18 (SUTURE) ×2
SUT VIC AB 1 CT1 27 (SUTURE) ×2
SUT VIC AB 1 CT1 27XBRD ANBCTR (SUTURE) ×1 IMPLANT
SUT VIC AB 2-0 CT1 27 (SUTURE) ×4
SUT VIC AB 2-0 CT1 TAPERPNT 27 (SUTURE) ×2 IMPLANT
TOWEL OR 17X24 6PK STRL BLUE (TOWEL DISPOSABLE) ×3 IMPLANT
TOWEL OR 17X26 10 PK STRL BLUE (TOWEL DISPOSABLE) ×6 IMPLANT
TRAY FOLEY W/METER SILVER 16FR (SET/KITS/TRAYS/PACK) IMPLANT
WATER STERILE IRR 1000ML POUR (IV SOLUTION) ×3 IMPLANT

## 2018-01-19 NOTE — Anesthesia Postprocedure Evaluation (Signed)
Anesthesia Post Note  Patient: [name]  Procedure(s) Performed: OPEN REDUCTION INTERNAL FIXATION (ORIF) DISTAL FEMUR FRACTURE (Right Leg Upper)     Patient location during evaluation: PACU Anesthesia Type: General Level of consciousness: awake and alert Pain management: pain level controlled Vital Signs Assessment: post-procedure vital signs reviewed and stable Respiratory status: spontaneous breathing, nonlabored ventilation, respiratory function stable and patient connected to nasal cannula oxygen Cardiovascular status: blood pressure returned to baseline and stable Postop Assessment: no apparent nausea or vomiting Anesthetic complications: no    Last Vitals:  Vitals:   01/19/18 1751 01/19/18 1806  BP: 140/71 (!) 149/70  Pulse: 81 80  Resp: 17 19  Temp:  36.5 C  SpO2: 99% 99%    Last Pain:  Vitals:   01/19/18 1706  TempSrc:   PainSc: 0-No pain                 Windsor Zirkelbach COKER

## 2018-01-19 NOTE — Progress Notes (Signed)
Initial Nutrition Assessment  DOCUMENTATION CODES:   Not applicable  INTERVENTION:   -Once diet is advanced:  Ensure Enlive po TID, each supplement provides 350 kcal and 20 grams of protein -MVI daily  NUTRITION DIAGNOSIS:   Increased nutrient needs related to post-op healing as evidenced by estimated needs.  GOAL:   Patient will meet greater than or equal to 90% of their needs  MONITOR:   Diet advancement, Supplement acceptance, Labs, Weight trends, Skin, I & O's  REASON FOR ASSESSMENT:   Consult Hip fracture protocol  ASSESSMENT:   [name] is a 77 y.o. female with a Past Medical History of COPD, dementia, and hypothyroidism who presents with mechanical fall resulting in R femur fracture.    Pt admitted with rt femur fx.   Case discussed with RN, who confirmed plan for ORIF today.   Pt with drowsy at time of visit. Hx obtained from family members at bedside. All reports that pt has experienced a general decline in health over the past several months. They report that pt consumes very little at baseline (typically bites and sips at meals). This has gotten progressively worse over the past 2-3 weeks, when pt was placed on a sodium restricted diet- pt typically only consumes highly seasoned foods such as pickles, cake, and cheesecake. Pt husband will prepare pt healthful meals such as asparagus and grilled chicken, but pt will refuse complaining that it does not taste right. No chewing or swallowing difficulties noted from family members.   Family also expresses concern over pt wt loss. They estimate pt UBW of around 150#. They endorse a 25# wt loss over the past 2-3 weeks, however, no wt hx available to confirm this information; they report pt was 119# last week at a PCP appointment.   Of note, pt family shares that pt has had little motivation to do anything. Pt is very sedentary at baseline and just recently left home after being housebound for approximately 5  years (pt did not receive medical care during this time frame).   Discussed importance of good meal and supplement intake to promote healing. Pt family requesting nutritional supplements once diet advances.  Noted pt pt with mild muscle depletion in upper and lower extremities, of which sedentary lifestyle may be contributing. Pt at risk for malnutrition due to poor oral intake and increased nutrient need for post-op healing, however, unable to identify at this time.    Labs reviewed.   NUTRITION - FOCUSED PHYSICAL EXAM:    Most Recent Value  Orbital Region  No depletion  Upper Arm Region  Mild depletion  Thoracic and Lumbar Region  No depletion  Buccal Region  No depletion  Temple Region  No depletion  Clavicle Bone Region  No depletion  Clavicle and Acromion Bone Region  No depletion  Scapular Bone Region  No depletion  Dorsal Hand  Mild depletion  Patellar Region  Mild depletion  Anterior Thigh Region  Mild depletion  Posterior Calf Region  Mild depletion  Edema (RD Assessment)  None  Hair  Reviewed  Eyes  Reviewed  Mouth  Reviewed  Skin  Reviewed  Nails  Reviewed       Diet Order:  Diet NPO time specified Except for: Sips with Meds  EDUCATION NEEDS:   Education needs have been addressed  Skin:  Skin Assessment: Reviewed RN Assessment  Last BM:  PTA  Height:   Ht Readings from Last 1 Encounters:  01/18/18 5\' 4"  (1.626 m)  Weight:   Wt Readings from Last 1 Encounters:  01/18/18 130 lb 1.1 oz (59 kg)    Ideal Body Weight:  54.5 kg  BMI:  Body mass index is 22.33 kg/m.  Estimated Nutritional Needs:   Kcal:  1600-1800  Protein:  80-95 grams  Fluid:  1.6-1.8 L    Tessi Eustache A. Jimmye Norman, RD, LDN, CDE Pager: [phone] After hours Pager: [phone]

## 2018-01-19 NOTE — Op Note (Signed)
Orthopaedic Surgery Operative Note (CSN: [id number])  [name]  05-03-1941 Date of Surgery: 01/18/2018 - 01/19/2018   Diagnoses:  RIGHT periprosthetic femur fracture  Procedure: ORIF Right periprosthetic femur fracture 27507   Operative Finding Successful completion of planned procedure.Good lag fixation and fibertape cerclage performed.  Moderate bone quality.   Post-operative plan: The patient will be TDWB x6 weeks.  The patient will be admitted for mobilization and discharge per hospitalist.  DVT prophylaxis with lovenox x6 weeks.  Pain control with PRN pain medication preferring oral medicines.  Follow up plan will be scheduled in approximately 10-14 days for wound check and femoral XR.  Post-Op Diagnosis: Same Surgeons:Primary: Hiram Gash, MD Assistants:Brandon Leslee Home OPA Location: [facility] OR ROOM 03 Anesthesia: General Antibiotics: Ancef 2g preop, Local 1g vancomycin powder Tourniquet time: * No tourniquets in log * Estimated Blood Loss: 144 Complications: None Specimens: None Implants: Implant Name Type Inv. Item Serial No. Manufacturer Lot No. LRB No. Used Action  14 hole femur shaft plate      Right 1 Implanted  CAP LOCK NCB - [id number] Cap CAP LOCK NCB  [employer](ORTH,TRAU,BIO,SG)  Right 7 Implanted  SCREW NCB 4.0X36MM - [id number] Screw SCREW NCB 4.0X36MM  [employer](ORTH,TRAU,BIO,SG)  Right 2 Implanted  SCREW NCB 4.0MX30M - [id number] Screw SCREW NCB 4.0MX30M  [employer](ORTH,TRAU,BIO,SG)  Right 2 Implanted  SCREW NCB 5.0X30MM - [id number] Screw SCREW NCB 5.0X30MM  [employer](ORTH,TRAU,BIO,SG)  Right 1 Implanted  SCREW 5.0 32MM - [id number] Screw SCREW 5.0 32MM  [employer](ORTH,TRAU,BIO,SG)  Right 3 Implanted  SCREW NCB 5.0X34MM - [id number] Screw SCREW NCB 5.0X34MM  [employer](ORTH,TRAU,BIO,SG)  Right 1 Implanted    Indications for Surgery:   REESHEMAH NAZARYAN is a 77 y.o. female with fall and previous history of dementia and previous hemiarthroplasty and  total knee arthroplasty with periprosthetic midshaft femur fracture and well fixed components.  Benefits and risks of operative and nonoperative management were discussed prior to surgery with patient/guardian(s) and informed consent form was completed.  Specific risks including infection, need for additional surgery, additional periprostatic fracture, infection, pain, nonunion   Procedure:   The patient was identified in the preoperative holding area where the surgical site was marked. The patient was taken to the OR where a procedural timeout was called and the above noted anesthesia was induced.  The patient was positioned supine with a bump.  Preoperative antibiotics were dosed.  The patient's right leg was prepped and draped in the usual sterile fashion.  A second preoperative timeout was called.      We marked after identifying with fluoroscopy the fracture site and made a lateral approach to the femur dissecting sharply through the subcutaneous tissue achieving hemostasis as we progressed identifying the IT band.  The IT band was split along its fibers longitudinally and we were able to identify the vastus.  Self-retaining retractors were placed in the vastus was bluntly split along its fibers using a Cobb elevator obtaining hemostasis we progressed.  We identified the fracture site immediately in the center of our incision.  This point fracture hematoma was cleared with a curette and the bone was sharply debrided in a non-excisional fashion.  We then were able to use a series of reduction forceps to hold and clamp and anatomic reduction.  This point we used the [employer] NCB trays and performed a lag screw by technique 4 mm in size using fluoroscopy to guide our reduction and our lag screw placement.  Due to the  long oblique nature of the fracture we were unable to obtain another lag screw without interfering with our plate position thus we placed a cerclage fiber tape staying just on bone throughout.   We were able to safely identify and grab the tape and then tied with good tension.  The second point of fixation allowed [place] to remove our clamps.  We checked our reduction was anatomic.  This point we selected our plate taking care to have 4 screws proximal and distal of the fracture site as well as locating the plate so that we could place bicortical screws around the femoral stem while not interfering with it.  Once the plate was centered under fluoroscopic guidance we placed 2 nonlocking screws and checked our placement again on orthogonal views.  At this point we used fluoroscopy to guide placement of 3 screws around the stem achieving bicortical fixation through cement.  Locking caps were placed on all the screw heads and then we proceeded to the distal screws and were able to place 3 locking screws at the distal aspect of the plate achieving plate position near the total knee component but not completely overlapping it to avoid infection risk that came with possible arthrotomy of the knee component itself.  Locking caps were placed on these as well.  At this point we checked final fluoroscopic views orthogonally were happy with our reduction.  The knee and hip were stable at the end of the case.  The incision was thoroughly irrigated and closed in a multilayer fashion with a Monocryl final closure.  Vancomycin powder was placed both above and below the fascia.  A sterile dressing was placed in the form of Aquacel.  Local anesthetic was infiltrated in the subcutaneous skin.  The patient was awoken from general anesthesia and taken to the PACU in stable condition without complication.   Joya Gaskins, OPA-C, present and scrubbed throughout the case, critical for completion in a timely fashion, and for retraction, instrumentation, closure.

## 2018-01-19 NOTE — Transfer of Care (Signed)
Immediate Anesthesia Transfer of Care Note  Patient: [name]  Procedure(s) Performed: OPEN REDUCTION INTERNAL FIXATION (ORIF) DISTAL FEMUR FRACTURE (Right Leg Upper)  Patient Location: PACU  Anesthesia Type:General  Level of Consciousness: awake and alert   Airway & Oxygen Therapy: Patient Spontanous Breathing and Patient connected to nasal cannula oxygen  Post-op Assessment: Report given to RN and Post -op Vital signs reviewed and stable  Post vital signs: Reviewed and stable  Last Vitals:  Vitals:   01/19/18 1230 01/19/18 1706  BP: (!) 96/51 (!) 158/98  Pulse: (!) 104 91  Resp: 18 15  Temp: 36.8 C 36.8 C  SpO2: 94% 93%    Last Pain:  Vitals:   01/19/18 1706  TempSrc:   PainSc: 0-No pain      Patients Stated Pain Goal: 2 (16/55/37 4827)  Complications: No apparent anesthesia complications

## 2018-01-19 NOTE — Progress Notes (Signed)
   01/19/18 1000  Clinical Encounter Type  Visited With Patient and family together  Visit Type Initial  Referral From Nurse  Consult/Referral To Chaplain  Spiritual Encounters  Spiritual Needs Emotional;Prayer  Stress Factors  Patient Stress Factors (awaiting surgery)   Responded to a SCC for prayer.  Patient in the bed and family and friends and clergy present.  Family indicated patient had a fall yesterday and will have surgery today.  Patient appeared worried.  Patient has lots of support both from family and spiritual support.  Will follow and support as needed. Chaplain Katherene Ponto

## 2018-01-19 NOTE — Progress Notes (Signed)
PROGRESS NOTE   [name]  PIR:[id number]    DOB: Mar 23, 1941    DOA: 01/18/2018  PCP: Wenda Low, MD   I have briefly reviewed patients previous medical records in [facility].  Brief Narrative:  77 year old married female, lives with spouse, ambulates with the help of a walker, PMH of hypertension, thyroid disease not on medications, dementia, B12 deficiency on daily B12 shots for the last 1 week PTA, felt dizzy and sustained an unwitnessed mechanical fall in her bathroom that led to right periprosthetic femur fracture.  Orthopedics consulted and plans surgical fixation 3/6.   Assessment & Plan:   Principal Problem:   Femur fracture, right (HCC) Active Problems:   Hypertension   Thyroid disease   Memory difficulty   B12 deficiency   Right periprosthetic femur fracture: Sustained after a mechanical fall at home.  Orthopedics was consulted.  She was placed on Buck's traction overnight of admission.  Plan is for ORIF on 3/6.  Based on available data, patient at moderate risk for perioperative CV events but may proceed with surgery without any further cardiac evaluation.  Mechanical fall: Patient reports intermittent dizziness at home and unsteadiness.  Not sure if she is orthostatic which cannot be checked now due to the fracture.  PT evaluation postop and may need SNF which patient currently refuses but family prefer SNF.  Dizziness: Chronic and intermittent.  Unclear etiology.  Unable to check orthostatic blood pressures due to fracture.  EKG on admission showed mild sinus tachycardia without acute findings.  CT head and neck without acute findings.  Further evaluation as outpatient.  Could also be due to B12 deficiency and associated peripheral neuropathy.  Essential hypertension: Reasonable inpatient control.  Hold thiazides for now.  Continue benazepril.  B12 deficiency: Getting daily B12 shots for the last week and was supposed to transition to weekly B12 shots.   Outpatient follow-up.  Weight loss: As per spouse, patient has lost approximately 23 pounds over the last 6 months.  She refuses to eat or move around.  Outpatient follow-up.  Dementia: Probably moderate at least.  Adult failure to thrive: Multifactorial secondary to advanced age, dementia and comorbidities.  Microcytic anemia: May be due to B12 deficiency.  Stable  Leukocytosis: Possibly stress response.  WBC improved from 22.7 > 14.  Right lung base density: Seen on chest x-ray 3/5 concerning for mass or neoplasm.  CT chest recommended, can pursue postop inpatient or non-emergently as outpatient.  Thyroid disease: Outpatient follow-up with PCP.  Check TSH.   DVT prophylaxis: Postop as per orthopedic recommendation. Code Status: Full Family Communication: Discussed in detail with patient's spouse and 3 children at bedside including a neighbor. Disposition: Pending clinical improvement, possibly to SNF if patient agrees   Consultants:  Orthopedics  Procedures:  Right lower extremity Buck's traction  Antimicrobials:  None   Subjective: Seen this morning.  Denied complaints.  Poor historian.  As per spouse, he heard a thud in the bathroom and when he went, he found patient lying on the floor and unable to get up with right hip pain.  Patient has chronic intermittent dizziness.  Never smoked.  Does not use home oxygen.  No reported cardiac or pulmonary history.  No history of chest pain, palpitations, dyspnea or leg swelling.  Does not do much walking except at home.  Patient was seen this morning prior to surgery.  ROS: As above  Objective:  Vitals:   01/18/18 1704 01/18/18 2135 01/19/18 0514 01/19/18 1230  BP: (!) 147/80 114/65 129/65 (!) 96/51  Pulse: (!) 119 (!) 103 (!) 105 (!) 104  Resp: 18 17 17 18   Temp: 98.9 F (37.2 C) 98.5 F (36.9 C) 98.8 F (37.1 C) 98.3 F (36.8 C)  TempSrc: Oral Oral Oral Oral  SpO2: 93% 95% 92% 94%  Weight:      Height:         Examination:  General exam: Elderly female, small built and frail, poorly nourished, lying comfortably supine in bed. Respiratory system: Clear to auscultation. Respiratory effort normal. Cardiovascular system: S1 & S2 heard, RRR. No JVD, murmurs, rubs, gallops or clicks. No pedal edema. Gastrointestinal system: Abdomen is nondistended, soft and nontender. No organomegaly or masses felt. Normal bowel sounds heard. Central nervous system: Alert and oriented x2. No focal neurological deficits. Extremities: Symmetric 5 x 5 power.  Right lower extremity in Buck's traction. Skin: No rashes, lesions or ulcers Psychiatry: Judgement and insight appear normal. Mood & affect flat.     Data Reviewed: I have personally reviewed following labs and imaging studies  CBC: Recent Labs  Lab 01/18/18 1159 01/19/18 0457  WBC 22.7* 14.0*  NEUTROABS 20.9*  --   HGB 11.9* 11.4*  HCT 35.8* 34.5*  MCV 122.6* 125.0*  PLT 359 765   Basic Metabolic Panel: Recent Labs  Lab 01/18/18 1159 01/19/18 0457  NA 138 140  K 4.0 3.9  CL 100* 99*  CO2 27 27  GLUCOSE 117* 78  BUN 8 10  CREATININE 0.67 0.72  CALCIUM 8.6* 8.8*   Liver Function Tests: No results for input(s): AST, ALT, ALKPHOS, BILITOT, PROT, ALBUMIN in the last 168 hours. Coagulation Profile: Recent Labs  Lab 01/18/18 1646  INR 1.20   Cardiac Enzymes: No results for input(s): CKTOTAL, CKMB, CKMBINDEX, TROPONINI in the last 168 hours. HbA1C: No results for input(s): HGBA1C in the last 72 hours. CBG: No results for input(s): GLUCAP in the last 168 hours.  Recent Results (from the past 240 hour(s))  Surgical PCR screen     Status: None   Collection Time: 01/18/18  6:59 PM  Result Value Ref Range Status   MRSA, PCR NEGATIVE NEGATIVE Final   Staphylococcus aureus NEGATIVE NEGATIVE Final    Comment: (NOTE) The Xpert SA Assay (FDA approved for NASAL specimens in patients 71 years of age and older), is one component of a  comprehensive surveillance program. It is not intended to diagnose infection nor to guide or monitor treatment. Performed at [facility], [place] [address], [place], [place] [address]          Radiology Studies: Ct Head Wo Contrast  Result Date: 01/18/2018 CLINICAL DATA:  Headache and neck pain. EXAM: CT HEAD WITHOUT CONTRAST CT CERVICAL SPINE WITHOUT CONTRAST TECHNIQUE: Multidetector CT imaging of the head and cervical spine was performed following the standard protocol without intravenous contrast. Multiplanar CT image reconstructions of the cervical spine were also generated. COMPARISON:  06/21/2009 MRI FINDINGS: CT HEAD FINDINGS Brain: Generalized atrophy. Chronic small-vessel ischemic changes affecting the pons, thalami, basal ganglia and hemispheric white matter. No sign of cortical or large vessel territory infarction. No definable acute insult. No mass lesion, hemorrhage, hydrocephalus or extra-axial collection. Vascular: There is atherosclerotic calcification of the major vessels at the base of the brain. Skull: Negative Sinuses/Orbits: Ordinary mucosal thickening in the paranasal sinuses with a small amount of mucoid material in the left division of the sphenoid sinus. Orbits negative. Other: None CT CERVICAL SPINE FINDINGS Alignment: Chronic curvature  convex to the left. Degenerative anterolisthesis at C4-5 of 3 mm. Skull base and vertebrae: Solid fusion at the posterior elements C2-3. No primary bone lesion. Conchae cavity of the superior endplate of T1 which was present on the previous MRI, therefore obviously chronic. No sign of acute bone injury. Soft tissues and spinal canal: Negative Disc levels: Ordinary osteoarthritis at C1-2. Facet arthropathy on the right at C3-4, C4-5 and C7-T1. Facet arthropathy on the left at C3-4 and C7-T1. Disc space narrowing with endplate osteophytes and bulging of the discs, most notable at C3-4 and C5-6. No likely critical canal or foraminal narrowing.  Upper chest: No active process.  Mild scarring. Other: None IMPRESSION: Head CT: Advanced chronic small-vessel ischemic changes throughout the brain. No identifiable acute finding by CT. Cervical spine CT: Curvature and chronic degenerative changes throughout the region as outlined above. No acute finding. Electronically Signed   By: Nelson Chimes M.D.   On: 01/18/2018 11:29   Ct Cervical Spine Wo Contrast  Result Date: 01/18/2018 CLINICAL DATA:  Headache and neck pain. EXAM: CT HEAD WITHOUT CONTRAST CT CERVICAL SPINE WITHOUT CONTRAST TECHNIQUE: Multidetector CT imaging of the head and cervical spine was performed following the standard protocol without intravenous contrast. Multiplanar CT image reconstructions of the cervical spine were also generated. COMPARISON:  06/21/2009 MRI FINDINGS: CT HEAD FINDINGS Brain: Generalized atrophy. Chronic small-vessel ischemic changes affecting the pons, thalami, basal ganglia and hemispheric white matter. No sign of cortical or large vessel territory infarction. No definable acute insult. No mass lesion, hemorrhage, hydrocephalus or extra-axial collection. Vascular: There is atherosclerotic calcification of the major vessels at the base of the brain. Skull: Negative Sinuses/Orbits: Ordinary mucosal thickening in the paranasal sinuses with a small amount of mucoid material in the left division of the sphenoid sinus. Orbits negative. Other: None CT CERVICAL SPINE FINDINGS Alignment: Chronic curvature convex to the left. Degenerative anterolisthesis at C4-5 of 3 mm. Skull base and vertebrae: Solid fusion at the posterior elements C2-3. No primary bone lesion. Conchae cavity of the superior endplate of T1 which was present on the previous MRI, therefore obviously chronic. No sign of acute bone injury. Soft tissues and spinal canal: Negative Disc levels: Ordinary osteoarthritis at C1-2. Facet arthropathy on the right at C3-4, C4-5 and C7-T1. Facet arthropathy on the left at C3-4  and C7-T1. Disc space narrowing with endplate osteophytes and bulging of the discs, most notable at C3-4 and C5-6. No likely critical canal or foraminal narrowing. Upper chest: No active process.  Mild scarring. Other: None IMPRESSION: Head CT: Advanced chronic small-vessel ischemic changes throughout the brain. No identifiable acute finding by CT. Cervical spine CT: Curvature and chronic degenerative changes throughout the region as outlined above. No acute finding. Electronically Signed   By: Nelson Chimes M.D.   On: 01/18/2018 11:29   Chest Portable 1 View  Result Date: 01/18/2018 CLINICAL DATA:  Preop chest exam. EXAM: PORTABLE CHEST 1 VIEW COMPARISON:  Radiograph of June 10, 2014. FINDINGS: Stable cardiomegaly. Atherosclerosis of thoracic aorta is noted. New rounded density is noted medially in right lung base concerning for possible mass or neoplasm. No pneumothorax or pleural effusion is noted. No acute pulmonary disease is noted. Bony thorax is unremarkable. IMPRESSION: New rounded density seen medially in right lung base concerning for possible mass or neoplasm. CT scan of the chest is recommended for further evaluation. These results will be called to the ordering clinician or representative by the Radiologist Assistant, and communication documented in the PACS  or zVision Dashboard. Aortic Atherosclerosis (ICD10-I70.0). Electronically Signed   By: Marijo Conception, M.D.   On: 01/18/2018 13:35   Dg Hip Unilat W Or Wo Pelvis 2-3 Views Right  Result Date: 01/18/2018 CLINICAL DATA:  Right hip pain secondary to a fall today. Leg deformity. EXAM: DG HIP (WITH OR WITHOUT PELVIS) 2-3V RIGHT COMPARISON:  Radiographs dated 10/07/2005 FINDINGS: There is an overriding displaced spiral fracture of mid right femoral shaft. Diffuse osteopenia. No hip fracture. Right hip prosthesis is properly located. Visualized pelvic bones are intact. IMPRESSION: 1. Displaced overriding spiral fracture of the right femoral shaft.  2. No acute abnormality of the right hip. Electronically Signed   By: Lorriane Shire M.D.   On: 01/18/2018 11:51   Dg Femur Min 2 Views Right  Result Date: 01/18/2018 CLINICAL DATA:  Right hip pain after fall. EXAM: RIGHT FEMUR 2 VIEWS COMPARISON:  None. FINDINGS: Status post right hip and knee arthroplasties. Severely displaced oblique fracture is seen involving the midportion of the right femoral shaft inferior to femoral prosthesis. IMPRESSION: Severely displaced oblique fracture involving right femoral shaft. Electronically Signed   By: Marijo Conception, M.D.   On: 01/18/2018 11:50        Scheduled Meds: . [MAR Hold] benazepril  20 mg Oral Daily  . [MAR Hold] cyanocobalamin  1,000 mcg Intramuscular Once  . [MAR Hold] feeding supplement (ENSURE ENLIVE)  237 mL Oral TID BM  . [MAR Hold] Influenza vac split quadrivalent PF  0.5 mL Intramuscular Tomorrow-1000  . [MAR Hold] multivitamin with minerals  1 tablet Oral Daily  . [MAR Hold] pneumococcal 23 valent vaccine  0.5 mL Intramuscular Tomorrow-1000  . povidone-iodine  2 application Topical Once  . [MAR Hold] triamterene-hydrochlorothiazide  1 tablet Oral [id number]   Continuous Infusions: .  ceFAZolin (ANCEF) IV    . [MAR Hold] methocarbamol (ROBAXIN)  IV       LOS: 1 day     Vernell Leep, MD, FACP, [facility]. [facility] Pager [phone]  If 7PM-7AM, please contact night-coverage [email or web address] Password [email or web address] 01/19/2018, 1:01 PM

## 2018-01-19 NOTE — Care Management Note (Signed)
Case Management Note  Patient Details  Name: BAMBI FEHNEL MRN: [id number] Date of Birth: 1941-01-25  Subjective/Objective:                    Action/Plan:  Await post op PT evaluation  Expected Discharge Date:                  Expected Discharge Plan:     In-House Referral:     Discharge planning Services  CM Consult  Post Acute Care Choice:  Home Health, Durable Medical Equipment Choice offered to:     DME Arranged:    DME Agency:     HH Arranged:    [place] Agency:     Status of Service:  In process, will continue to follow  If discussed at Long Length of Stay Meetings, dates discussed:    Additional Comments:  Marilu Favre, RN 01/19/2018, 12:24 PM

## 2018-01-19 NOTE — Progress Notes (Signed)
Patient ID: [name], female   DOB: December 26, 1940, 77 y.o.   MRN: [id number]   LOS: 1 day   Subjective: Drowsy, pain controlled   Objective: Vital signs in last 24 hours: Temp:  [98.3 F (36.8 C)-98.9 F (37.2 C)] 98.8 F (37.1 C) (03/06 0514) Pulse Rate:  [79-119] 105 (03/06 0514) Resp:  [16-27] 17 (03/06 0514) BP: (114-159)/(65-107) 129/65 (03/06 0514) SpO2:  [90 %-98 %] 92 % (03/06 0514) Weight:  [59 kg (130 lb 1.1 oz)] 59 kg (130 lb 1.1 oz) (03/05 1703) Last BM Date: (Unknown)   Laboratory  CBC Recent Labs    01/18/18 1159 01/19/18 0457  WBC 22.7* 14.0*  HGB 11.9* 11.4*  HCT 35.8* 34.5*  PLT 359 398   BMET Recent Labs    01/18/18 1159 01/19/18 0457  NA 138 140  K 4.0 3.9  CL 100* 99*  CO2 27 27  GLUCOSE 117* 78  BUN 8 10  CREATININE 0.67 0.72  CALCIUM 8.6* 8.8*     Physical Exam General appearance: alert and no distress  RLE: DP 2+, warm, EHL intact, sensation intact   Assessment/Plan: Fall Right periprosthetic femur fx -- ORIF today by Dr. Griffin Basil about 1300 Presyncope -- Will leave workup to primary team Dementia HTN B12 deficiency -- Was being treated as OP by PCP FTT -- ~40# weight loss over several months to years     Lisette Abu, PA-C Orthopedic Surgery [phone] 01/19/2018

## 2018-01-19 NOTE — Progress Notes (Signed)
Orthopedic Tech Progress Note Patient Details:  [name] 08-20-1941 [id number]  Patient ID: [name], female   DOB: 02-Feb-1941, 77 y.o.   MRN: [id number] I had to replace the rope attaching the boot to the weight because it was to short when they pulled the patient up in the bed.   Karolee Stamps 01/19/2018, 6:31 AM

## 2018-01-19 NOTE — Interval H&P Note (Signed)
Discussed case, risks and benefits with patient again.  All questions answered, no change to history.  Boyde Grieco MD  

## 2018-01-19 NOTE — Anesthesia Preprocedure Evaluation (Addendum)

## 2018-01-19 NOTE — Anesthesia Procedure Notes (Signed)
Procedure Name: Intubation Date/Time: 01/19/2018 2:38 PM Performed by: Kyrie Bun T, CRNA Pre-anesthesia Checklist: Patient identified, Emergency Drugs available, Suction available and Patient being monitored Patient Re-evaluated:Patient Re-evaluated prior to induction Oxygen Delivery Method: Circle system utilized Preoxygenation: Pre-oxygenation with 100% oxygen Induction Type: IV induction Ventilation: Mask ventilation without difficulty Laryngoscope Size: Miller and 2 Grade View: Grade I Tube type: Oral Tube size: 7.5 mm Number of attempts: 1 Airway Equipment and Method: Patient positioned with wedge pillow and Stylet Placement Confirmation: ETT inserted through vocal cords under direct vision,  positive ETCO2 and breath sounds checked- equal and bilateral Secured at: 21 cm Tube secured with: Tape Dental Injury: Teeth and Oropharynx as per pre-operative assessment

## 2018-01-20 DIAGNOSIS — D62 Acute posthemorrhagic anemia: Secondary | ICD-10-CM

## 2018-01-20 LAB — BASIC METABOLIC PANEL
Anion gap: 10 (ref 5–15)
BUN: 14 mg/dL (ref 6–20)
CO2: 23 mmol/L (ref 22–32)
Calcium: 7.8 mg/dL — ABNORMAL LOW (ref 8.9–10.3)
Chloride: 103 mmol/L (ref 101–111)
Creatinine, Ser: 0.87 mg/dL (ref 0.44–1.00)
GFR calc Af Amer: >60 mL/min (ref >60)
GFR calc non Af Amer: >60 mL/min (ref >60)
Glucose, Bld: 112 mg/dL — ABNORMAL HIGH (ref 65–99)
Potassium: 3.7 mmol/L (ref 3.5–5.1)
SODIUM: 136 mmol/L (ref 135–145)

## 2018-01-20 LAB — CBC
HCT: 22.7 % — ABNORMAL LOW (ref 36.0–46.0)
Hemoglobin: 7.6 g/dL — ABNORMAL LOW (ref 12.0–15.0)
MCH: 41.5 pg — AB (ref 26.0–34.0)
MCHC: 33.5 g/dL (ref 30.0–36.0)
MCV: 124 fL — ABNORMAL HIGH (ref 78.0–100.0)
Platelets: 267 10*3/uL (ref 150–400)
RBC: 1.83 MIL/uL — ABNORMAL LOW (ref 3.87–5.11)
RDW: 17.1 % — ABNORMAL HIGH (ref 11.5–15.5)
WBC: 15.3 10*3/uL — ABNORMAL HIGH (ref 4.0–10.5)

## 2018-01-20 LAB — HEMOGLOBIN AND HEMATOCRIT, BLOOD
HCT: 25.8 % — ABNORMAL LOW (ref 36.0–46.0)
HEMATOCRIT: 22.6 % — AB (ref 36.0–46.0)
HEMOGLOBIN: 7.4 g/dL — AB (ref 12.0–15.0)
Hemoglobin: 8.7 g/dL — ABNORMAL LOW (ref 12.0–15.0)

## 2018-01-20 LAB — PREPARE RBC (CROSSMATCH)

## 2018-01-20 MED ORDER — SODIUM CHLORIDE 0.9 % IV SOLN
Freq: Once | INTRAVENOUS | Status: AC
  Administered 2018-01-20: 13:00:00 via INTRAVENOUS

## 2018-01-20 MED ORDER — TRAMADOL HCL 50 MG PO TABS: 50.0000 mg | ORAL_TABLET | Freq: Four times a day (QID) | ORAL | 0 refills | Status: DC | PRN

## 2018-01-20 MED ORDER — ACETAMINOPHEN 500 MG PO TABS: 1000.0000 mg | ORAL_TABLET | Freq: Three times a day (TID) | ORAL | 0 refills | Status: AC

## 2018-01-20 MED ORDER — ENOXAPARIN SODIUM 40 MG/0.4ML ~~LOC~~ SOLN: 40.0000 mg | SUBCUTANEOUS | 0 refills | Status: DC

## 2018-01-20 MED ORDER — CELECOXIB 100 MG PO CAPS: 100.0000 mg | ORAL_CAPSULE | Freq: Every day | ORAL | 2 refills | Status: DC

## 2018-01-20 MED ORDER — LACTATED RINGERS IV SOLN
INTRAVENOUS | Status: AC
  Administered 2018-01-20: 19:00:00 via INTRAVENOUS

## 2018-01-20 NOTE — Progress Notes (Signed)
PROGRESS NOTE   Breanna FLEGAL  YSA:[id number]    DOB: 03-07-1941    DOA: 01/18/2018  PCP: Wenda Low, MD   I have briefly reviewed patients previous medical records in [facility].  Brief Narrative:  77 year old married female, lives with spouse, ambulates with the help of a walker, PMH of hypertension, thyroid disease not on medications, dementia, B12 deficiency on daily B12 shots for the last 1 week PTA, felt dizzy and sustained an unwitnessed mechanical fall in her bathroom that led to right periprosthetic femur fracture.  Orthopedics consulted and s/p ORIF 3/6.   Assessment & Plan:   Principal Problem:   Femur fracture, right (HCC) Active Problems:   Hypertension   Thyroid disease   Memory difficulty   B12 deficiency   Right periprosthetic femur fracture: Sustained after a mechanical fall at home.  Orthopedics was consulted.  She was placed on Buck's traction overnight of admission.  She underwent ORIF on 01/19/18.  As per orthopedic follow-up: Touchdown weightbearing right lower extremity, Lovenox for VTE prophylaxis for 6 weeks and outpatient follow-up with orthopedics in 2 weeks.  Clinical social work coordinating SNF placement.  Postop acute blood loss anemia complicating chronic macrocytic anemia: Hemoglobin dropped from 11.4 preop to 7.4 the next day.  Discussed with patient and family who are agreeable to blood transfusion.  Transfusing 1 unit PRBC.  Follow posttransfusion CBC and daily.  B12 supplementations.  Mechanical fall: Patient reports intermittent dizziness at home and unsteadiness.  Not sure if she is orthostatic which cannot be checked now due to the fracture.  SNF at discharge.  Dizziness: Chronic and intermittent.  Unclear etiology.  Unable to check orthostatic blood pressures due to fracture.  EKG on admission showed mild sinus tachycardia without acute findings.  CT head and neck without acute findings.  Further evaluation as outpatient.  Could also be  due to B12 deficiency and associated peripheral neuropathy.  Essential hypertension: Reasonable inpatient control.  Hold thiazides for now.  Continue benazepril.  B12 deficiency: Getting daily B12 shots for the last week and was supposed to transition to weekly B12 shots.  Outpatient follow-up.  Weight loss: As per spouse, patient has lost approximately 23 pounds over the last 6 months.  She refuses to eat or move around.  Outpatient follow-up.  Dementia: Probably moderate at least.  Adult failure to thrive: Multifactorial secondary to advanced age, dementia and comorbidities.  Leukocytosis: Possibly stress response.  WBC improved from 22.7 > 14.  Right lung base density: Seen on chest x-ray 3/5 concerning for mass or neoplasm.  CT chest recommended, can pursue postop inpatient or non-emergently as outpatient.  Thyroid disease: Outpatient follow-up with PCP.  TSH normal/2.417.   DVT prophylaxis: Lovenox Code Status: Full Family Communication: Discussed in detail with patient's spouse and family friend at bedside. Disposition: Pending clinical improvement, to SNF in 1-2 days.   Consultants:  Orthopedics  Procedures:  Right lower extremity Buck's traction ORIF of right periprosthetic femur fracture 3/6  Antimicrobials:  None   Subjective: Patient poor historian.  Not saying much.  No pain reported.  No overt bleeding.  As per RN, patient complained to her of feeling weak.  No dizziness.  Also not much urine output since this morning.  ROS: As above  Objective:  Vitals:   01/20/18 1037 01/20/18 1250 01/20/18 1307 01/20/18 1535  BP: (!) 107/44 100/77 114/64 102/60  Pulse: 90 78 80 86  Resp: 18 18 16 14   Temp: 98.6 F (37  C) 98.4 F (36.9 C) 98.8 F (37.1 C) 98.4 F (36.9 C)  TempSrc: Oral Oral Oral Oral  SpO2: 97% 100% 99% 98%  Weight:      Height:        Examination:  General exam: Elderly female, small built and frail, poorly nourished, lying comfortably  supine in bed.  Does not appear in any distress. Respiratory system: Clear to auscultation. Respiratory effort normal.  Stable. Cardiovascular system: S1 & S2 heard, RRR. No JVD, murmurs, rubs, gallops or clicks. No pedal edema.  Stable. Gastrointestinal system: Abdomen is nondistended, soft and nontender. No organomegaly or masses felt. Normal bowel sounds heard. Central nervous system: Alert and oriented x2. No focal neurological deficits. Extremities: Symmetric 5 x 5 power.  Right hip postop dressing site clean and dry. Skin: No rashes, lesions or ulcers Psychiatry: Judgement and insight impaired. Mood & affect flat.     Data Reviewed: I have personally reviewed following labs and imaging studies  CBC: Recent Labs  Lab 01/18/18 1159 01/19/18 0457 01/20/18 0430 01/20/18 0847  WBC 22.7* 14.0* 15.3*  --   NEUTROABS 20.9*  --   --   --   HGB 11.9* 11.4* 7.6* 7.4*  HCT 35.8* 34.5* 22.7* 22.6*  MCV 122.6* 125.0* 124.0*  --   PLT 359 398 267  --    Basic Metabolic Panel: Recent Labs  Lab 01/18/18 1159 01/19/18 0457 01/20/18 0430  NA 138 140 136  K 4.0 3.9 3.7  CL 100* 99* 103  CO2 27 27 23   GLUCOSE 117* 78 112*  BUN 8 10 14   CREATININE 0.67 0.72 0.87  CALCIUM 8.6* 8.8* 7.8*   Coagulation Profile: Recent Labs  Lab 01/18/18 1646  INR 1.20     Recent Results (from the past 240 hour(s))  Surgical PCR screen     Status: None   Collection Time: 01/18/18  6:59 PM  Result Value Ref Range Status   MRSA, PCR NEGATIVE NEGATIVE Final   Staphylococcus aureus NEGATIVE NEGATIVE Final    Comment: (NOTE) The Xpert SA Assay (FDA approved for NASAL specimens in patients 84 years of age and older), is one component of a comprehensive surveillance program. It is not intended to diagnose infection nor to guide or monitor treatment. Performed at [facility], [place] [address] [place], [place] [address]          Radiology Studies: Dg C-arm 1-60 Min  Result Date:  01/19/2018 CLINICAL DATA:  ORIF right femur fracture EXAM: DG C-ARM 61-120 MIN; RIGHT FEMUR 2 VIEWS COMPARISON:  01/18/2018 FINDINGS: Mid femur fracture below the prosthetic stem has her been fixed with a lateral plate and multiple screws. Fracture alignment appears satisfactory. Right knee replacement is partially imaged. IMPRESSION: Lateral plate fixation of mid femur fracture on the right. Electronically Signed   By: Franchot Gallo M.D.   On: 01/19/2018 16:52   Dg Femur, Min 2 Views Right  Result Date: 01/19/2018 CLINICAL DATA:  ORIF right femur fracture EXAM: DG C-ARM 61-120 MIN; RIGHT FEMUR 2 VIEWS COMPARISON:  01/18/2018 FINDINGS: Mid femur fracture below the prosthetic stem has her been fixed with a lateral plate and multiple screws. Fracture alignment appears satisfactory. Right knee replacement is partially imaged. IMPRESSION: Lateral plate fixation of mid femur fracture on the right. Electronically Signed   By: Franchot Gallo M.D.   On: 01/19/2018 16:52   Dg Femur Port, Min 2 Views Right  Result Date: 01/19/2018 CLINICAL DATA:  Status post ORIF of  right femoral fracture EXAM: RIGHT FEMUR PORTABLE 2 VIEW COMPARISON:  Intraoperative films from earlier in the same day as well as a preoperative film from the previous day. FINDINGS: Right hip replacement is again noted. A fixation sideplate is now seen in the mid right femur with multiple fixation screws. Fracture fragments are in anatomic alignment. Right knee prosthesis is seen as well. IMPRESSION: Status post ORIF of right mid femoral shaft fracture Electronically Signed   By: Inez Catalina M.D.   On: 01/19/2018 20:53        Scheduled Meds: . benazepril  20 mg Oral Daily  . celecoxib  200 mg Oral BID  . docusate sodium  100 mg Oral BID  . enoxaparin (LOVENOX) injection  40 mg Subcutaneous Q24H  . feeding supplement (ENSURE ENLIVE)  237 mL Oral TID BM  . Influenza vac split quadrivalent PF  0.5 mL Intramuscular Tomorrow-1000  . multivitamin  with minerals  1 tablet Oral Daily  . pneumococcal 23 valent vaccine  0.5 mL Intramuscular Tomorrow-1000   Continuous Infusions: . lactated ringers 10 mL/hr at 01/19/18 1304  . methocarbamol (ROBAXIN)  IV       LOS: 2 days     Vernell Leep, MD, FACP, [facility]. [facility] Pager [phone]  If 7PM-7AM, please contact night-coverage [email or web address] Password [facility] 01/20/2018, 6:14 PM

## 2018-01-20 NOTE — Social Work (Signed)
CSW met with patient at bedside however she was confused unable to participate in the discharge planning. CSW contacted spouse to discuss SNF placement and was unable to reach. CSW left a message and will f/u for discharge planning.  Elissa Hefty, LCSW Clinical Social Worker [phone]

## 2018-01-20 NOTE — Progress Notes (Signed)
ORTHOPAEDIC PROGRESS NOTE  s/p Procedure(s): OPEN REDUCTION INTERNAL FIXATION (ORIF) DISTAL FEMUR FRACTURE  SUBJECTIVE: Reports mild pain about operative site. No chest pain. No SOB. No nausea/vomiting. No other complaints.  OBJECTIVE: PE:RLE: incision CDI, leg lengths equal, warm well perfused foot, intact EHL/TA/GSC   Vitals:   01/20/18 0251 01/20/18 0540  BP: (!) 119/93 (!) 111/50  Pulse: 97 88  Resp: 17 18  Temp: 98.1 F (36.7 C) 98 F (36.7 C)  SpO2: 98% 99%     ASSESSMENT: [name] is a 77 y.o. female doing well postoperatively.  PLAN: Weightbearing: TDWB RLE Insicional and dressing care: OK to remove dressings POD7 but leave steri strips in place.  If they lift up you can have the steri strips replaced. and leave open to air with dry gauze PRN Orthopedic device(s): None Showering: OK to shower with aquacel on.  Do not soak dressings or incision after aquacel removed. VTE prophylaxis: Lovenox 40mg  qd 6 weeks Pain control: PRN meds.  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

## 2018-01-20 NOTE — Progress Notes (Signed)
Patient has not voided much since foley was removed this morning, bladder scan only shows 151ml max, paged MD and let him know the situation. Await call back from MD.

## 2018-01-20 NOTE — Progress Notes (Signed)
Patient's hemoglobin resulted this morning with significant drop from 11.4 to 7.6 with no signs of active bleeding. Paged Hongalgi MD to let him know and ask if wanted to re-draw, he said yes place order for stat redraw and call with result. Will continue to monitor.

## 2018-01-20 NOTE — Progress Notes (Signed)
PT Cancellation Note  Patient Details Name: [name] MRN: [id number] DOB: [birth year]   Cancelled Treatment:    Reason Eval/Treat Not Completed: Patient not medically ready.  Hgb too low and pt not very responsive/focused for therapy.  Pt to get a unit of blood and will attempt to see pt this afternoon as able. 01/20/2018  Donnella Sham, [place] [phone]  (pager)   Tessie Fass Deyana Wnuk 01/20/2018, 11:08 AM

## 2018-01-20 NOTE — Evaluation (Signed)
Physical Therapy Evaluation Patient Details Name: [name] MRN: [id number] DOB: [birth year]  Today's Date: 01/20/2018   History of Present Illness  pt is a 77 y/o female with PMH of HTN and TKR admitted after getting dizzy and falling sustaing a R hip fx.  Pt is now s/p R hip ORIF and will be TDWB for at least 6 weeks.  Clinical Impression  Pt admitted with/for fall with hip fx s/p R hip ORIF.  Pt needing mod to max assist for general mobility and transfers..  Pt currently limited functionally due to the problems listed. ( See problems list.)   Pt will benefit from PT to maximize function and safety in order to get ready for next venue listed below.     Follow Up Recommendations SNF    Equipment Recommendations  Other (comment)(TBA)    Recommendations for Other Services       Precautions / Restrictions Precautions Precautions: Fall Restrictions RLE Weight Bearing: Touchdown weight bearing      Mobility  Bed Mobility Overal bed mobility: Needs Assistance Bed Mobility: Supine to Sit     Supine to sit: Mod assist;Max assist     General bed mobility comments: pt bridged to assist scoot to EOB, needing pad to assist scoot.  Needed significant assist to come up and forward to sit, but scooted to EOB with minimal assist  Transfers Overall transfer level: Needs assistance Equipment used: 1 person hand held assist;2 person hand held assist Transfers: Squat Pivot Transfers     Squat pivot transfers: Max assist;+2 safety/equipment     General transfer comment: face to face transfer bed to [facility] and BSC to recline with assist to maintain TDWB on R LE   Ambulation/Gait             General Gait Details: pt unable at this time  Stairs            Wheelchair Mobility    Modified Rankin (Stroke Patients Only)       Balance Overall balance assessment: Needs assistance Sitting-balance support: Single extremity supported;No upper extremity supported Sitting  balance-Leahy Scale: Fair     Standing balance support: Bilateral upper extremity supported Standing balance-Leahy Scale: Poor Standing balance comment: External support required and pt need R LE TDWB support                             Pertinent Vitals/Pain Pain Assessment: Faces Faces Pain Scale: Hurts little more Pain Location: R hip Pain Descriptors / Indicators: Moaning;Grimacing;Guarding    Home Living   Living Arrangements: Spouse/significant other Available Help at Discharge: Family;Friend(s);Available 24 hours/day Type of Home: House Home Access: Stairs to enter     Home Layout: One level        Prior Function Level of Independence: Independent with assistive device(s)               Hand Dominance        Extremity/Trunk Assessment   Upper Extremity Assessment Upper Extremity Assessment: Defer to OT evaluation    Lower Extremity Assessment Lower Extremity Assessment: Generalized weakness;RLE deficits/detail RLE Coordination: decreased gross motor    Cervical / Trunk Assessment Cervical / Trunk Assessment: Kyphotic  Communication   Communication: Other (comment)(minimally verbal, halted speech)  Cognition Arousal/Alertness: Awake/alert;Lethargic Behavior During Therapy: Flat affect Overall Cognitive Status: Impaired/Different from baseline  General Comments: Minimal verbalization      General Comments      Exercises     Assessment/Plan    PT Assessment Patient needs continued PT services  PT Problem List Decreased strength;Decreased activity tolerance;Decreased balance;Decreased mobility;Pain       PT Treatment Interventions Gait training;DME instruction;Functional mobility training;Therapeutic activities;Therapeutic exercise;Patient/family education    PT Goals (Current goals can be found in the Care Plan section)  Acute Rehab PT Goals Patient Stated Goal: pt did not  participate.  Pt's husband states that pt needs rehab before coming home. PT Goal Formulation: With patient Time For Goal Achievement: 02/03/18 Potential to Achieve Goals: Good    Frequency Min 3X/week   Barriers to discharge        Co-evaluation               AM-PAC PT "6 Clicks" Daily Activity  Outcome Measure Difficulty turning over in bed (including adjusting bedclothes, sheets and blankets)?: Unable Difficulty moving from lying on back to sitting on the side of the bed? : Unable Difficulty sitting down on and standing up from a chair with arms (e.g., wheelchair, bedside commode, etc,.)?: Unable Help needed moving to and from a bed to chair (including a wheelchair)?: A Lot Help needed walking in hospital room?: Total Help needed climbing 3-5 steps with a railing? : Total 6 Click Score: 7    End of Session   Activity Tolerance: Patient tolerated treatment well Patient left: in chair;with call bell/phone within reach;with chair alarm set;with family/visitor present Nurse Communication: Mobility status PT Visit Diagnosis: Other abnormalities of gait and mobility (R26.89);Muscle weakness (generalized) (M62.81);Difficulty in walking, not elsewhere classified (R26.2);Pain Pain - Right/Left: Right Pain - part of body: Hip    Time: 1751-0258 PT Time Calculation (min) (ACUTE ONLY): 25 min   Charges:   PT Evaluation $PT Eval Moderate Complexity: 1 Mod PT Treatments $Therapeutic Activity: 8-22 mins   PT G Codes:        01-22-2018  Donnella Sham, PT [phone]  (pager)  Tessie Fass Alexsys Eskin January 22, 2018, 3:44 PM

## 2018-01-21 ENCOUNTER — Encounter (HOSPITAL_COMMUNITY): Payer: Self-pay | Admitting: Orthopaedic Surgery

## 2018-01-21 LAB — BASIC METABOLIC PANEL
Anion gap: 8 (ref 5–15)
BUN: 22 mg/dL — ABNORMAL HIGH (ref 6–20)
CO2: 27 mmol/L (ref 22–32)
Calcium: 8.1 mg/dL — ABNORMAL LOW (ref 8.9–10.3)
Chloride: 105 mmol/L (ref 101–111)
Creatinine, Ser: 0.89 mg/dL (ref 0.44–1.00)
GLUCOSE: 98 mg/dL (ref 65–99)
Potassium: 3.7 mmol/L (ref 3.5–5.1)
Sodium: 140 mmol/L (ref 135–145)

## 2018-01-21 LAB — CBC
HEMATOCRIT: 24.8 % — AB (ref 36.0–46.0)
Hemoglobin: 8.2 g/dL — ABNORMAL LOW (ref 12.0–15.0)
MCH: 37.3 pg — ABNORMAL HIGH (ref 26.0–34.0)
MCHC: 33.1 g/dL (ref 30.0–36.0)
MCV: 112.7 fL — AB (ref 78.0–100.0)
PLATELETS: 251 10*3/uL (ref 150–400)
RBC: 2.2 MIL/uL — AB (ref 3.87–5.11)
RDW: 24 % — ABNORMAL HIGH (ref 11.5–15.5)
WBC: 10.4 10*3/uL (ref 4.0–10.5)

## 2018-01-21 MED ORDER — LACTATED RINGERS IV SOLN
INTRAVENOUS | Status: DC
  Administered 2018-01-21 – 2018-01-22 (×2): via INTRAVENOUS

## 2018-01-21 NOTE — Clinical Social Work Placement (Signed)
   CLINICAL SOCIAL WORK PLACEMENT  NOTE [place] Date:  01/21/2018  Patient Details  Name: [name] MRN: [id number] Date of Birth: [birth year]  Clinical Social Work is seeking post-discharge placement for this patient at the [place] level of care (*CSW will initial, date and re-position this form in  chart as items are completed):  Yes   Patient/family provided with [place] Work Department's list of facilities offering this level of care within the geographic area requested by the patient (or if unable, by the patient's family).  Yes   Patient/family informed of their freedom to choose among providers that offer the needed level of care, that participate in Medicare, Medicaid or managed care program needed by the patient, have an available bed and are willing to accept the patient.  Yes   Patient/family informed of [place]'s ownership interest in [facility] and [facility], as well as of the fact that they are under no obligation to receive care at these facilities.  PASRR submitted to EDS on       PASRR number received on 01/19/18     Existing PASRR number confirmed on       FL2 transmitted to all facilities in geographic area requested by pt/family on 01/20/18     FL2 transmitted to all facilities within larger geographic area on       Patient informed that his/her managed care company has contracts with or will negotiate with certain facilities, including the following:        Yes   Patient/family informed of bed offers received.  Patient chooses bed at [place], [facility]     Physician recommends and patient chooses bed at      Patient to be transferred to [place] on 01/22/18.  Patient to be transferred to facility by [facility]     Patient family notified on 01/21/18 of transfer.  Name of family member notified:  Shanon Brow, husband     PHYSICIAN Please prepare priority discharge summary, including medications      Additional Comment:    _______________________________________________ Alexander Mt, LCSWA 01/21/2018, 5:19 PM

## 2018-01-21 NOTE — Progress Notes (Signed)
Notified MD Hongalgi bladder scan was 160 ml. No response.

## 2018-01-21 NOTE — Progress Notes (Signed)
Physical Therapy Treatment Patient Details Name: [name] MRN: [id number] DOB: 1941/05/06 Today's Date: 01/21/2018    History of Present Illness pt is a 77 y/o female with PMH of HTN, memory difficulty, COPD, L Breast CA, R wrist surgery, bil THA, multiple left knee revision and I and D surgeries, and back surgery admitted after getting dizzy and falling sustaing a R hip fx.  Pt is now s/p R hip ORIF and will be TDWB for at least 6 weeks.    PT Comments    Assisted pt back to bed per husband request.  She could not make it until lunch and was actually sliding forward in the recliner chair.  She was able to scoot   Follow Up Recommendations  SNF     Equipment Recommendations  Wheelchair (measurements PT);Wheelchair cushion (measurements PT);Hospital bed    Recommendations for Other Services  NA     Precautions / Restrictions Precautions Precautions: Fall Restrictions Weight Bearing Restrictions: Yes RLE Weight Bearing: Touchdown weight bearing    Mobility  Bed Mobility Overal bed mobility: Needs Assistance Bed Mobility: ;Sit to Supine     Sit to supine: Max assist   General bed mobility comments: Max assist to control descent of trunk and lift both legs back up into the bed from sitting.   Transfers Overall transfer level: Needs assistance Equipment used: None Transfers: Squat Pivot Transfers     Squat pivot transfers: Max assist     General transfer comment: Max assist for level transfer from chair back to bed on her right.  Pt doing more this time using bil UEs to help scoot.  Muultiple small scoots needed to ensure safety and to make sure pt was not pushing hard through her foot.   Ambulation/Gait             General Gait Details: unable at this time.           Balance Overall balance assessment: Needs assistance Sitting-balance support: Feet supported;Bilateral upper extremity supported Sitting balance-Leahy Scale: Fair Sitting balance -  Comments: supervision EOB with bil arms proped                                    Cognition Arousal/Alertness: Awake/alert Behavior During Therapy: Flat affect Overall Cognitive Status: Impaired/Different from baseline Area of Impairment: Attention;Memory;Following commands;Safety/judgement;Awareness;Problem solving                   Current Attention Level: Sustained Memory: Decreased short-term memory;Decreased recall of precautions Following Commands: Follows one step commands with increased time Safety/Judgement: Decreased awareness of safety;Decreased awareness of deficits Awareness: Intellectual Problem Solving: Slow processing;Decreased initiation;Difficulty sequencing;Requires verbal cues;Requires tactile cues General Comments: Pt processing more quickly now that she has been up and interacting with a good friend and her husband in the room.  She was able to move much quicker to feed herself once back in the bed.  She is still very flat and often does not respond to questions.              Pertinent Vitals/Pain Pain Assessment: Faces Faces Pain Scale: Hurts even more Pain Location: R hip Pain Descriptors / Indicators: Moaning;Grimacing;Guarding Pain Intervention(s): Limited activity within patient's tolerance;Monitored during session;Repositioned           PT Goals (current goals can now be found in the care plan section) Acute Rehab PT Goals Patient Stated Goal: Unable to  state Progress towards PT goals: Progressing toward goals    Frequency    Min 3X/week      PT Plan Current plan remains appropriate       AM-PAC PT "6 Clicks" Daily Activity  Outcome Measure  Difficulty turning over in bed (including adjusting bedclothes, sheets and blankets)?: Unable Difficulty moving from lying on back to sitting on the side of the bed? : Unable Difficulty sitting down on and standing up from a chair with arms (e.g., wheelchair, bedside commode,  etc,.)?: Unable Help needed moving to and from a bed to chair (including a wheelchair)?: Total Help needed walking in hospital room?: Total Help needed climbing 3-5 steps with a railing? : Total 6 Click Score: 6    End of Session Equipment Utilized During Treatment: Gait belt;Oxygen Activity Tolerance: Patient limited by fatigue;Patient limited by pain Patient left: with call bell/phone within reach;with family/visitor present;in bed;with bed alarm set   PT Visit Diagnosis: Other abnormalities of gait and mobility (R26.89);Muscle weakness (generalized) (M62.81);Difficulty in walking, not elsewhere classified (R26.2);Pain Pain - Right/Left: Right Pain - part of body: Hip     Time: 7425-9563 PT Time Calculation (min) (ACUTE ONLY): 15 min  Charges:  $Therapeutic Activity: 8-22 mins          Daneya Hartgrove B. [place], [place], DPT [phone]            01/21/2018, 2:16 PM

## 2018-01-21 NOTE — Social Work (Addendum)
CSW met with pt and pt husband at bedside, Pt husband amenable to SNF, with preference to [facility] facilities. [phone]- Pt husbands cell phone, prefers house phone.  3:15pm- Left bed offers on table, pt asleep and did not rouse with CSW visit, CSW left message on pt husband cell phone.  4:00pm- CSW spoke with pt husband and pt friend, they have accepted offer at [employer], [place] obtaining auth, should be good to discharge pending medically appropriateness.   CSW continuing to follow.  Alexander Mt, [place] Work [phone]

## 2018-01-21 NOTE — [place] FL2 (Signed)
[place] MEDICAID FL2 LEVEL OF CARE SCREENING TOOL     IDENTIFICATION  Patient Name: [name] Birthdate: 1941-04-28 Sex: female Admission Date (Current Location): 01/18/2018  [facility] and [place] Number:  [employer] and Address:  [facility] [place], [place] [address] [place], [place] [address]      Provider Number: [id number]  Attending Physician Name and Address:  Modena Jansky, MD  Relative Name and Phone Number:  Malerie Eakins, husband, [phone]    Current Level of Care: Hospital Recommended Level of Care: [place] Prior Approval Number:    Date Approved/Denied:   PASRR Number: [id number]  Discharge Plan: SNF    Current Diagnoses: Patient Active Problem List   Diagnosis Date Noted  . Femur fracture, right ([place]) 01/18/2018  . B12 deficiency 01/18/2018  . Hypertension   . Thyroid disease   . Dementia   . Memory difficulty     Orientation RESPIRATION BLADDER Height & Weight     Self  O2(Nasal Cannula 2L) Incontinent Weight: 130 lb 1.1 oz (59 kg) Height:  5\' 4"  (162.6 cm)  BEHAVIORAL SYMPTOMS/MOOD NEUROLOGICAL BOWEL NUTRITION STATUS    (memory loss ) Continent Diet(see discharge summary)  AMBULATORY STATUS COMMUNICATION OF NEEDS Skin   Extensive Assist(non weightbearing post surgery) Verbally Surgical wounds(R Leg incision)                       Personal Care Assistance Level of Assistance  Bathing, Feeding, Dressing Bathing Assistance: Maximum assistance Feeding assistance: Limited assistance Dressing Assistance: Maximum assistance     Functional Limitations Info  Sight, Hearing, Speech Sight Info: Adequate Hearing Info: Adequate Speech Info: Adequate    SPECIAL CARE FACTORS FREQUENCY  PT (By licensed PT), OT (By licensed OT)     PT Frequency: 5x week OT Frequency: 5x week            Contractures Contractures Info: Not present    Additional Factors Info  Code Status, Allergies  Code Status Info: Full Code Allergies Info: Codeine           Current Medications (01/21/2018):  This is the current hospital active medication list Current Facility-Administered Medications  Medication Dose Route Frequency Provider Last Rate Last Dose  . acetaminophen (TYLENOL) tablet 975 mg  975 mg Oral PRN Radene Gunning, NP      . albuterol (PROVENTIL) (2.5 MG/3ML) 0.083% nebulizer solution 3 mL  3 mL Inhalation Q6H PRN Radene Gunning, NP      . benazepril (LOTENSIN) tablet 20 mg  20 mg Oral Daily Radene Gunning, NP   20 mg at 01/21/18 0839  . celecoxib (CELEBREX) capsule 200 mg  200 mg Oral BID Ophelia Charter T, MD   200 mg at 01/21/18 0839  . docusate sodium (COLACE) capsule 100 mg  100 mg Oral BID Ophelia Charter T, MD   100 mg at 01/21/18 0840  . enoxaparin (LOVENOX) injection 40 mg  40 mg Subcutaneous Q24H Ophelia Charter T, MD   40 mg at 01/21/18 0839  . feeding supplement (ENSURE ENLIVE) (ENSURE ENLIVE) liquid 237 mL  237 mL Oral TID BM Modena Jansky, MD   237 mL at 01/21/18 0846  . fentaNYL (SUBLIMAZE) injection 50 mcg  50 mcg Intravenous Q2H PRN Radene Gunning, NP   50 mcg at 01/19/18 1840  . Influenza vac split quadrivalent PF (FLUZONE HIGH-DOSE) injection 0.5 mL  0.5 mL Intramuscular Tomorrow-1000 Karmen Bongo,  MD      . menthol-cetylpyridinium (CEPACOL) lozenge 3 mg  1 lozenge Oral PRN Hiram Gash, MD       Or  . phenol (CHLORASEPTIC) mouth spray 1 spray  1 spray Mouth/Throat PRN Hiram Gash, MD      . methocarbamol (ROBAXIN) tablet 500 mg  500 mg Oral Q6H PRN Radene Gunning, NP   500 mg at 01/21/18 7741   Or  . methocarbamol (ROBAXIN) 500 mg in dextrose 5 % 50 mL IVPB  500 mg Intravenous Q6H PRN Black, Karen M, NP      . metoCLOPramide (REGLAN) tablet 5-10 mg  5-10 mg Oral Q8H PRN Hiram Gash, MD       Or  . metoCLOPramide (REGLAN) injection 5-10 mg  5-10 mg Intravenous Q8H PRN Hiram Gash, MD      . multivitamin with minerals tablet 1 tablet  1 tablet Oral Daily  Modena Jansky, MD   1 tablet at 01/21/18 [id number]  . ondansetron (ZOFRAN) tablet 4 mg  4 mg Oral Q6H PRN Hiram Gash, MD       Or  . ondansetron [facility]) injection 4 mg  4 mg Intravenous Q6H PRN Ophelia Charter T, MD      . pneumococcal 23 valent vaccine (PNU-IMMUNE) injection 0.5 mL  0.5 mL Intramuscular Tomorrow-1000 Karmen Bongo, MD      . senna-docusate (Senokot-S) tablet 1 tablet  1 tablet Oral QHS PRN Radene Gunning, NP      . traMADol Veatrice Bourbon) tablet 100 mg  100 mg Oral Q6H PRN Hiram Gash, MD   100 mg at 01/20/18 6767     Discharge Medications: Please see discharge summary for a list of discharge medications.  Relevant Imaging Results:  Relevant Lab Results:   Additional Information SS# [place], [place]

## 2018-01-21 NOTE — Progress Notes (Signed)
Physical Therapy Treatment Patient Details Name: [name] MRN: [id number] DOB: 08-27-1941 Today's Date: 01/21/2018    History of Present Illness pt is a 77 y/o female with PMH of HTN, memory difficulty, COPD, L Breast CA, R wrist surgery, bil THA, multiple left knee revision and I and D surgeries, and back surgery admitted after getting dizzy and falling sustaing a R hip fx.  Pt is now s/p R hip ORIF and will be TDWB for at least 6 weeks.    PT Comments    Pt able to sit EOB, preform AAROM exercises and squat pivot OOB to the recliner chair.  She tolerated the session well and husband was present observing.  She remains appropriate for SNF level rehab at discharge.    Follow Up Recommendations  SNF     Equipment Recommendations  Wheelchair (measurements PT);Wheelchair cushion (measurements PT);Hospital bed    Recommendations for Other Services   NA     Precautions / Restrictions Precautions Precautions: Fall Restrictions Weight Bearing Restrictions: Yes RLE Weight Bearing: Touchdown weight bearing    Mobility  Bed Mobility Overal bed mobility: Needs Assistance Bed Mobility: Supine to Sit;Sit to Supine     Supine to sit: Max assist;HOB elevated     General bed mobility comments: Max assist to help progress bil legs to EOB and support trunk during transition to bring trunk up off of bed.  HOB maximally elevated.   Transfers Overall transfer level: Needs assistance Equipment used: None Transfers: Squat Pivot Transfers     Squat pivot transfers: Max assist;From elevated surface     General transfer comment: Max assist to squat pivot to pt's left side to drop arm chair.  Pt able to help stabilize her trunk for balance with her arms, but is not able to push up much with her good leg.   Ambulation/Gait             General Gait Details: unable at this time.           Balance Overall balance assessment: Needs assistance Sitting-balance support: Feet  supported;Bilateral upper extremity supported Sitting balance-Leahy Scale: Fair Sitting balance - Comments: supervision EOB with bil arms proped                                    Cognition Arousal/Alertness: Awake/alert Behavior During Therapy: Flat affect Overall Cognitive Status: Impaired/Different from baseline Area of Impairment: Attention;Memory;Following commands;Safety/judgement;Awareness;Problem solving                   Current Attention Level: Sustained Memory: Decreased short-term memory;Decreased recall of precautions Following Commands: Follows one step commands with increased time Safety/Judgement: Decreased awareness of safety;Decreased awareness of deficits Awareness: Intellectual Problem Solving: Slow processing;Decreased initiation;Difficulty sequencing;Requires verbal cues;Requires tactile cues General Comments: Blank stare, does not speak much.  slow to move and slow to process      Exercises Total Joint Exercises Ankle Circles/Pumps: AAROM;Both;20 reps Heel Slides: AAROM;Both;10 reps Hip ABduction/ADduction: AAROM;Both;10 reps Long Arc Quad: AAROM;Both;10 reps        Pertinent Vitals/Pain Pain Assessment: Faces Faces Pain Scale: Hurts even more Pain Location: R hip Pain Descriptors / Indicators: Moaning;Grimacing;Guarding Pain Intervention(s): Limited activity within patient's tolerance;Monitored during session;Repositioned           PT Goals (current goals can now be found in the care plan section) Acute Rehab PT Goals Patient Stated Goal: Unable to state Progress  towards PT goals: Progressing toward goals    Frequency    Min 3X/week      PT Plan Current plan remains appropriate       AM-PAC PT "6 Clicks" Daily Activity  Outcome Measure  Difficulty turning over in bed (including adjusting bedclothes, sheets and blankets)?: Unable Difficulty moving from lying on back to sitting on the side of the bed? :  Unable Difficulty sitting down on and standing up from a chair with arms (e.g., wheelchair, bedside commode, etc,.)?: Unable Help needed moving to and from a bed to chair (including a wheelchair)?: Total Help needed walking in hospital room?: Total Help needed climbing 3-5 steps with a railing? : Total 6 Click Score: 6    End of Session Equipment Utilized During Treatment: Gait belt;Oxygen Activity Tolerance: Patient limited by fatigue;Patient limited by pain Patient left: in chair;with call bell/phone within reach;with family/visitor present;with chair alarm set   PT Visit Diagnosis: Other abnormalities of gait and mobility (R26.89);Muscle weakness (generalized) (M62.81);Difficulty in walking, not elsewhere classified (R26.2);Pain Pain - Right/Left: Right Pain - part of body: Hip     Time: 1010-1031 PT Time Calculation (min) (ACUTE ONLY): 21 min  Charges:  $Therapeutic Activity: 8-22 mins          Milayah Krell B. [place], [place], DPT [phone]           01/21/2018, 2:11 PM

## 2018-01-21 NOTE — Clinical Social Work Note (Addendum)
Clinical Social Work Assessment  Patient Details  Name: [name] MRN: [id number] Date of Birth: [birth year]  Date of referral:  01/21/18               Reason for consult:  Discharge Planning, Facility Placement                Permission sought to share information with:  Facility Sport and exercise psychologist, Family Supports Permission granted to share information::  No(pt only oriented to self)  Name::     [name]  Agency::  [facility]  Relationship::  husband  Contact Information:     Housing/Transportation Living arrangements for the past 2 months:  Single Family Home Source of Information:  Spouse Patient Interpreter Needed:  None Criminal Activity/Legal Involvement Pertinent to Current Situation/Hospitalization:  No - Comment as needed Significant Relationships:  Adult Children, Friend, Spouse Lives with:  Spouse Do you feel safe going back to the place where you live?  Yes Need for family participation in patient care:  Yes (Comment)(decision making and mobility)  Care giving concerns:Pt lives with husband on bedrest and then will need support with mobility. SNF recommended.   Social Worker assessment / plan:  CSW met with pt and pt husband at bedside, pt was suspicious of CSW and unable to answer any questions, pt husband was very friendly and answered all questions. Pt has lived at home with him in [place], so pt husband expressed preference for only [place] facilities, preference for [facility]. Pt and pt husband have two grown sons who are suportive but unavailable to help. Pt also has dog who means a lot to her. Pt husband amenable to SNF options and would like any additional guidance possible. CSW  Continuing to follow.   Employment status:  Retired Nurse, adult PT Recommendations:  [place] / Referral to community resources:  [place]  Patient/Family's Response to care:  Pt husband  responsive and accepting of CSW role, and SNF recommendation.   Patient/Family's Understanding of and Emotional Response to Diagnosis, Current Treatment, and Prognosis:  Unable to assess pt understanding and response to diagnosis, current treatment, and prognosis due to orientation. Pt husband states understanding of all three, was pleasant and emotionally positive, smiling during CSW visit, regarding pt care at hospital and SNF care recommended.   Emotional Assessment Appearance:  Appears stated age Attitude/Demeanor/Rapport:  Suspicious, Sedated Affect (typically observed):  Quiet, Withdrawn Orientation:  Oriented to Self Alcohol / Substance use:  Not Applicable Psych involvement (Current and /or in the community):  No (Comment)  Discharge Needs  Concerns to be addressed:  Discharge Planning Concerns, Care Coordination Readmission within the last 30 days:  No Current discharge risk:  Cognitively Impaired, Physical Impairment, Dependent with Mobility Barriers to Discharge:  Continued Medical Work up, [employer], [place] 01/21/2018, 5:13 PM

## 2018-01-21 NOTE — Progress Notes (Signed)
MD Hongalgi notified patient still not voiding, was I&O cath at 0500 this morning. He said bump her fluids up to 100 ml/hr and bladder scan later but if not greater than 400 ml don't I&O cath her.

## 2018-01-21 NOTE — Progress Notes (Signed)
PROGRESS NOTE   [name]  ZCH:[id number]    DOB: [birth year]    DOA: 01/18/2018  PCP: Wenda Low, MD   I have briefly reviewed patients previous medical records in [facility].  Brief Narrative:  77 year old married female, lives with spouse, ambulates with the help of a walker, PMH of hypertension, thyroid disease not on medications, dementia, B12 deficiency on daily B12 shots for the last 1 week PTA, felt dizzy and sustained an unwitnessed mechanical fall in her bathroom that led to right periprosthetic femur fracture.  Orthopedics consulted and s/p ORIF 3/6.   Assessment & Plan:   Principal Problem:   Femur fracture, right (HCC) Active Problems:   Hypertension   Thyroid disease   Memory difficulty   B12 deficiency   Right periprosthetic femur fracture: Sustained after a mechanical fall at home.  Orthopedics was consulted.  She was placed on Buck's traction overnight of admission.  She underwent ORIF on 01/19/18.  As per orthopedic follow-up: Touchdown weightbearing right lower extremity, Lovenox for VTE prophylaxis for 6 weeks and outpatient follow-up with orthopedics in 2 weeks.  Clinical social work coordinating SNF placement and can likely go to [facility] 3/9.  Postop acute blood loss anemia complicating chronic macrocytic anemia: Hemoglobin dropped from 11.4 preop to 7.4 the next day.  Discussed with patient and family who are agreeable to blood transfusion.  Transfused 1 unit PRBC 3/7.  Hemoglobin improved to 8.2.  B12 supplementations.  Some of her hemoglobin drop may be dilutional due to IV fluids.  Follow CBC in a.m.  Mechanical fall: Patient reports intermittent dizziness at home and unsteadiness.  Not sure if she is orthostatic which cannot be checked now due to the fracture.  SNF at discharge.  Dizziness: Chronic and intermittent.  Unclear etiology.  Unable to check orthostatic blood pressures due to fracture.  EKG on admission showed mild sinus tachycardia without  acute findings.  CT head and neck without acute findings.  Further evaluation as outpatient.  Could also be due to B12 deficiency and associated peripheral neuropathy.  Essential hypertension: Reasonable inpatient control.  Hold thiazides for now.  Continue benazepril.  B12 deficiency: Getting daily B12 shots for the last week and was supposed to transition to weekly B12 shots.  Outpatient follow-up.  Weight loss: As per spouse, patient has lost approximately 23 pounds over the last 6 months.  She refuses to eat or move around.  Outpatient follow-up.  Dementia: Probably moderate at least.  Adult failure to thrive: Multifactorial secondary to advanced age, dementia and comorbidities.  Leukocytosis: Possibly stress response.  WBC improved from 22.7 > 14, has normalized.  Right lung base density: Seen on chest x-ray 3/5 concerning for mass or neoplasm.  CT chest recommended, can pursue postop inpatient or non-emergently as outpatient.  Thyroid disease: Outpatient follow-up with PCP.  TSH normal/2.417.   DVT prophylaxis: Lovenox Code Status: Full Family Communication: Discussed in detail with patient's spouse at bedside. Disposition: DC to SNF 3/9 pending stable hemoglobin   Consultants:  Orthopedics  Procedures:  Right lower extremity Buck's traction ORIF of right periprosthetic femur fracture 3/6  Antimicrobials:  None   Subjective: As per nursing, issues of suspected decreased urine output over the last 24 hours.  Reportedly underwent in and out cath last night despite bladder scan showing 200 mL.  As per RN and spouse, reduced p.o. intake.  Increased IV fluids.  Patient denies complaints.  Denied pain.  ROS: As above  Objective:  Vitals:  01/20/18 1535 01/20/18 2125 01/21/18 0436 01/21/18 1335  BP: 102/60 (!) 116/45 (!) 136/55 (!) 123/57  Pulse: 86 86 74 93  Resp: 14 16 16 18   Temp: 98.4 F (36.9 C) 98.6 F (37 C) 98.4 F (36.9 C) 97.8 F (36.6 C)  TempSrc: Oral  Oral Oral Oral  SpO2: 98% 97% 99% 100%  Weight:      Height:        Examination:  General exam: Elderly female, small built and frail, poorly nourished, sitting up comfortably in chair this morning.  Did not appear in any distress. Respiratory system: Clear to auscultation. Respiratory effort normal.  Stable. Cardiovascular system: S1 & S2 heard, RRR. No JVD, murmurs, rubs, gallops or clicks. No pedal edema.  Stable. Gastrointestinal system: Abdomen is nondistended, soft and nontender. No organomegaly or masses felt. Normal bowel sounds heard.  Stable. Central nervous system: Alert and oriented x2. No focal neurological deficits.  No change/stable. Extremities: Symmetric 5 x 5 power.  Right hip postop dressing site clean and dry. Skin: No rashes, lesions or ulcers Psychiatry: Judgement and insight impaired. Mood & affect flat.     Data Reviewed: I have personally reviewed following labs and imaging studies  CBC: Recent Labs  Lab 01/18/18 1159 01/19/18 0457 01/20/18 0430 01/20/18 0847 01/20/18 1829 01/21/18 0608  WBC 22.7* 14.0* 15.3*  --   --  10.4  NEUTROABS 20.9*  --   --   --   --   --   HGB 11.9* 11.4* 7.6* 7.4* 8.7* 8.2*  HCT 35.8* 34.5* 22.7* 22.6* 25.8* 24.8*  MCV 122.6* 125.0* 124.0*  --   --  112.7*  PLT 359 398 267  --   --  528   Basic Metabolic Panel: Recent Labs  Lab 01/18/18 1159 01/19/18 0457 01/20/18 0430 01/21/18 0608  NA 138 140 136 140  K 4.0 3.9 3.7 3.7  CL 100* 99* 103 105  CO2 27 27 23 27   GLUCOSE 117* 78 112* 98  BUN 8 10 14  22*  CREATININE 0.67 0.72 0.87 0.89  CALCIUM 8.6* 8.8* 7.8* 8.1*   Coagulation Profile: Recent Labs  Lab 01/18/18 1646  INR 1.20     Recent Results (from the past 240 hour(s))  Surgical PCR screen     Status: None   Collection Time: 01/18/18  6:59 PM  Result Value Ref Range Status   MRSA, PCR NEGATIVE NEGATIVE Final   Staphylococcus aureus NEGATIVE NEGATIVE Final    Comment: (NOTE) The Xpert SA Assay (FDA  approved for NASAL specimens in patients 23 years of age and older), is one component of a comprehensive surveillance program. It is not intended to diagnose infection nor to guide or monitor treatment. Performed at [facility], [place] [address] [place], [place] [address]          Radiology Studies: Dg Femur [place], [place] 2 Views Right  Result Date: 01/19/2018 CLINICAL DATA:  Status post ORIF of right femoral fracture EXAM: RIGHT FEMUR PORTABLE 2 VIEW COMPARISON:  Intraoperative films from earlier in the same day as well as a preoperative film from the previous day. FINDINGS: Right hip replacement is again noted. A fixation sideplate is now seen in the mid right femur with multiple fixation screws. Fracture fragments are in anatomic alignment. Right knee prosthesis is seen as well. IMPRESSION: Status post ORIF of right mid femoral shaft fracture Electronically Signed   By: Inez Catalina M.D.   On: 01/19/2018 20:53  Scheduled Meds: . benazepril  20 mg Oral Daily  . celecoxib  200 mg Oral BID  . docusate sodium  100 mg Oral BID  . enoxaparin (LOVENOX) injection  40 mg Subcutaneous Q24H  . feeding supplement (ENSURE ENLIVE)  237 mL Oral TID BM  . Influenza vac split quadrivalent PF  0.5 mL Intramuscular Tomorrow-1000  . multivitamin with minerals  1 tablet Oral Daily  . pneumococcal 23 valent vaccine  0.5 mL Intramuscular Tomorrow-1000   Continuous Infusions: . methocarbamol (ROBAXIN)  IV       LOS: 3 days     Vernell Leep, MD, FACP, [facility]. [facility] Pager [phone]  If 7PM-7AM, please contact night-coverage [email or web address] Password [facility] 01/21/2018, 4:51 PM

## 2018-01-21 NOTE — Progress Notes (Signed)
ORTHOPAEDIC PROGRESS NOTE  s/p Procedure(s): OPEN REDUCTION INTERNAL FIXATION (ORIF) DISTAL FEMUR FRACTURE  SUBJECTIVE: No reported pain at operative site.  Somewhat confused and sedate this morning  OBJECTIVE: PE:RLE: incision CDI, leg lengths equal, warm well perfused foot, intact EHL/TA/GSC   Vitals:   01/20/18 2125 01/21/18 0436  BP: (!) 116/45 (!) 136/55  Pulse: 86 74  Resp: 16 16  Temp: 98.6 F (37 C) 98.4 F (36.9 C)  SpO2: 97% 99%     ASSESSMENT: [name] is a 77 y.o. female doing well postoperatively from orthopedic perspective.  Transfused yesterday 3/7.  Dressing CDI, from our perspective likely a dilutional component to anemia in addition to fracture hematoma.  No evidence for continued bleeding currently.  PLAN: Weightbearing: TDWB RLE Insicional and dressing care: OK to remove dressings POD7 but leave steri strips in place.  If they lift up you can have the steri strips replaced. and leave open to air with dry gauze PRN Orthopedic device(s): None Showering: OK to shower with aquacel on.  Do not soak dressings or incision after aquacel removed. VTE prophylaxis: Lovenox 40mg  qd 6 weeks Pain control: PRN meds.  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

## 2018-01-22 DIAGNOSIS — S7291XS Unspecified fracture of right femur, sequela: Secondary | ICD-10-CM

## 2018-01-22 LAB — TYPE AND SCREEN
ABO/RH(D): A POS
Antibody Screen: POSITIVE
Donor AG Type: NEGATIVE
Donor AG Type: NEGATIVE
PT AG Type: NEGATIVE
Unit division: 0
Unit division: 0

## 2018-01-22 LAB — CBC
HCT: 25 % — ABNORMAL LOW (ref 36.0–46.0)
Hemoglobin: 8.2 g/dL — ABNORMAL LOW (ref 12.0–15.0)
MCH: 37.3 pg — ABNORMAL HIGH (ref 26.0–34.0)
MCHC: 32.8 g/dL (ref 30.0–36.0)
MCV: 113.6 fL — AB (ref 78.0–100.0)
PLATELETS: 273 10*3/uL (ref 150–400)
RBC: 2.2 MIL/uL — ABNORMAL LOW (ref 3.87–5.11)
RDW: 22.4 % — AB (ref 11.5–15.5)
WBC: 8.4 10*3/uL (ref 4.0–10.5)

## 2018-01-22 LAB — BPAM RBC
Blood Product Expiration Date: 201903252359
Blood Product Expiration Date: 201903262359
ISSUE DATE / TIME: 201903071243
Unit Type and Rh: 6200
Unit Type and Rh: 6200

## 2018-01-22 MED ORDER — SENNOSIDES-DOCUSATE SODIUM 8.6-50 MG PO TABS: 1.0000 | ORAL_TABLET | Freq: Every evening | ORAL | Status: DC | PRN

## 2018-01-22 MED ORDER — ADULT MULTIVITAMIN W/MINERALS CH: 1.0000 | ORAL_TABLET | Freq: Every day | ORAL | Status: DC

## 2018-01-22 MED ORDER — DOCUSATE SODIUM 100 MG PO CAPS: 100.0000 mg | ORAL_CAPSULE | Freq: Two times a day (BID) | ORAL | Status: DC

## 2018-01-22 MED ORDER — ENSURE ENLIVE PO LIQD: 237.0000 mL | Freq: Three times a day (TID) | ORAL | Status: DC

## 2018-01-22 NOTE — Progress Notes (Signed)
Informed family that pt could go to [facility] in [place] today via [place].

## 2018-01-22 NOTE — Progress Notes (Signed)
Attempted to give report to [place] at [facility], she has just received another admission and requests that I call her in 20 minutes.

## 2018-01-22 NOTE — Clinical Social Work Placement (Signed)
   CLINICAL SOCIAL WORK PLACEMENT  NOTE  Date:  01/22/2018  Patient Details  Name: [name] MRN: [id number] Date of Birth: 01/17/1941  Clinical Social Work is seeking post-discharge placement for this patient at the [place] level of care (*CSW will initial, date and re-position this form in  chart as items are completed):  Yes   Patient/family provided with [facility] Work Department's list of facilities offering this level of care within the geographic area requested by the patient (or if unable, by the patient's family).  Yes   Patient/family informed of their freedom to choose among providers that offer the needed level of care, that participate in Medicare, Medicaid or managed care program needed by the patient, have an available bed and are willing to accept the patient.  Yes   Patient/family informed of [facility]'s ownership interest in [facility] and [facility], as well as of the fact that they are under no obligation to receive care at these facilities.  PASRR submitted to EDS on       PASRR number received on 01/19/18     Existing PASRR number confirmed on       FL2 transmitted to all facilities in geographic area requested by pt/family on 01/20/18     FL2 transmitted to all facilities within larger geographic area on       Patient informed that his/her managed care company has contracts with or will negotiate with certain facilities, including the following:        Yes   Patient/family informed of bed offers received.  Patient chooses bed at [facility], [facility]     Physician recommends and patient chooses bed at      Patient to be transferred to [facility] on 01/22/18.  Patient to be transferred to facility by [facility]     Patient family notified on 01/22/18 of transfer.  Name of family member notified:  Shanon Brow, husband     PHYSICIAN Please prepare priority discharge summary, including medications      Additional Comment:    _______________________________________________ Benard Halsted, [place] 01/22/2018, 11:13 AM

## 2018-01-22 NOTE — Discharge Instructions (Addendum)

## 2018-01-22 NOTE — Discharge Summary (Signed)
Physician Discharge Summary  [name]:[id number] DOB: 1941-06-29  PCP: Wenda Low, MD  Admit date: 01/18/2018 Discharge date: 01/22/2018  Recommendations for Outpatient Follow-up:  1. MD at SNF in 3 days with repeat labs (CBC & BMP). 2. Dr. Ophelia Charter, Orthopedics on 02/03/18 at 2:15 PM. 3. Dr. Wenda Low, PCP upon discharge from SNF. 4. Consider CT chest to further evaluate right lung base density seen on chest x-ray 01/18/18 concerning for mass or neoplasm.  May want to discuss with spouse prior to evaluation because even if malignancy diagnosed, she may not be a candidate for aggressive treatment given her advanced age, frail physical status and advanced dementia.  Home Health: N/A Equipment/Devices: N/A    Discharge Condition: Improved and stable CODE STATUS: Full Diet recommendation: Heart healthy diet.  Discharge Diagnoses:  Principal Problem:   Femur fracture, right ([place]) Active Problems:   Hypertension   Thyroid disease   Memory difficulty   B12 deficiency   Brief Summary: 77 year old married female, lives with spouse, ambulates with the help of a walker, PMH of hypertension, thyroid disease not on medications, dementia, B12 deficiency on daily B12 shots for the last 1 week PTA, felt dizzy and sustained an unwitnessed mechanical fall in her bathroom that led to right periprosthetic femur fracture.  Orthopedics consulted and s/p ORIF 3/6.   Assessment & Plan:   Right periprosthetic femur fracture: Sustained after a mechanical fall at home.  Orthopedics was consulted.  She was placed on Buck's traction overnight of admission.  She underwent ORIF on 01/19/18.  Postop instructions as per orthopedics as outlined below.  Patient has outpatient follow-up appointment with orthopedics.  She will be discharging to SNF/[facility], [place].  Patient has spontaneously voided urine in her bed this morning.  Postop acute blood loss anemia complicating chronic macrocytic anemia:  Hemoglobin dropped from 11.4 preop to 7.4 the next day.  Discussed with patient and family who are agreeable to blood transfusion.  Transfused 1 unit PRBC 3/7.  Hemoglobin improved to 8.2 and stable over the last 2 days.  B12 supplementations.  Some of her hemoglobin drop may be dilutional due to IV fluids.  Follow CBC closely at SNF.    Mechanical fall: Patient reports intermittent dizziness at home and unsteadiness.  Not sure if she is orthostatic which cannot be checked now due to the fracture.  SNF at discharge.  Dizziness: Chronic and intermittent.  Unclear etiology.  Unable to check orthostatic blood pressures due to fracture/pain.  EKG on admission showed mild sinus tachycardia without acute findings.  CT head and neck without acute findings.  Further evaluation as outpatient.  Could also be due to B12 deficiency and associated peripheral neuropathy.  Essential hypertension: Reasonable inpatient control.    Discontinued diuretics due to concern for inconsistent oral intake and risk for dehydration with associated complications and moreover her blood pressures are adequately controlled with benazepril alone.  Continue benazepril.  B12 deficiency: Getting daily B12 shots for the last week and was supposed to transition to weekly B12 shots. Continue B12 shots weekly for the next 3 weeks then transition to monthly and as per PCP recommendations.  Consider checking B12 level in a couple of months.  Weight loss: As per spouse, patient has lost approximately 23 pounds over the last 6 months.  She refuses to eat or move around.  Outpatient follow-up.  Dementia:  Advanced.  No behavioral abnormalities.  Adult failure to thrive: Multifactorial secondary to advanced age, dementia and comorbidities.  Leukocytosis: Possibly stress response. Resolved.  Right lung base density: Seen on chest x-ray 3/5 concerning for mass or neoplasm.  CT chest recommended, can pursue postop inpatient or  non-emergently as outpatient.  Thyroid disease: Outpatient follow-up with PCP.  TSH normal/2.417.   Consultants:  Orthopedics  Procedures:  Right lower extremity Buck's traction ORIF of right periprosthetic femur fracture 3/6   Discharge Instructions  Discharge Instructions    Call MD for:  difficulty breathing, headache or visual disturbances   Complete by:  As directed    Call MD for:  extreme fatigue   Complete by:  As directed    Call MD for:  persistant dizziness or light-headedness   Complete by:  As directed    Call MD for:  persistant nausea and vomiting   Complete by:  As directed    Call MD for:  redness, tenderness, or signs of infection (pain, swelling, redness, odor or green/yellow discharge around incision site)   Complete by:  As directed    Call MD for:  severe uncontrolled pain   Complete by:  As directed    Call MD for:  temperature >100.4   Complete by:  As directed    Diet - low sodium heart healthy   Complete by:  As directed    Discharge instructions   Complete by:  As directed    Weightbearing: TDWB RLE Insicional and dressing care: OK to remove dressings POD7 but leave steri strips in place.  If they lift up you can have the steri strips replaced. and leave open to air with dry gauze PRN Orthopedic device(s): None Showering: OK to shower with aquacel on.  Do not soak dressings or incision after aquacel removed. VTE prophylaxis: Lovenox 62m qd 6 weeks Pain control: PRN meds.  Minimize narcotic type medicines, schedule tylenol and tramadol for DC as patient reports anaphylaxis with codeine derivatives Follow - up plan: 2 weeks   Increase activity slowly   Complete by:  As directed        Medication List    STOP taking these medications   triamterene-hydrochlorothiazide 37.5-25 MG tablet Commonly known as:  MAXZIDE-25   TYLENOL 8 HOUR 650 MG CR tablet Generic drug:  acetaminophen Replaced by:  acetaminophen 500 MG tablet     TAKE  these medications   acetaminophen 500 MG tablet Commonly known as:  TYLENOL Take 2 tablets (1,000 mg total) by mouth every 8 (eight) hours for 14 days. Replaces:  TYLENOL 8 HOUR 650 MG CR tablet   albuterol 108 (90 Base) MCG/ACT inhaler Commonly known as:  PROVENTIL HFA;VENTOLIN HFA Inhale 2 puffs into the lungs every 6 (six) hours as needed for wheezing or shortness of breath.   B-12 COMPLIANCE INJECTION 1000 MCG/ML Kit Generic drug:  Cyanocobalamin Inject 1 mL as directed once a week.   benazepril 20 MG tablet Commonly known as:  LOTENSIN Take 20 mg by mouth daily.   celecoxib 100 MG capsule Commonly known as:  CELEBREX Take 1 capsule (100 mg total) by mouth daily.   docusate sodium 100 MG capsule Commonly known as:  COLACE Take 1 capsule (100 mg total) by mouth 2 (two) times daily.   enoxaparin 40 MG/0.4ML injection Commonly known as:  LOVENOX Inject 0.4 mLs (40 mg total) into the skin daily.   feeding supplement (ENSURE ENLIVE) Liqd Take 237 mLs by mouth 3 (three) times daily between meals.   multivitamin with minerals Tabs tablet Take 1 tablet by mouth daily.  senna-docusate 8.6-50 MG tablet Commonly known as:  Senokot-S Take 1 tablet by mouth at bedtime as needed for mild constipation.   traMADol 50 MG tablet Commonly known as:  ULTRAM Take 1 tablet (50 mg total) by mouth every 6 (six) hours as needed.       Contact information for follow-up providers    Hiram Gash, MD Follow up on 02/03/2018.   Specialty:  Orthopedic Surgery Why:  appointment time 2:15 pm Contact information: [address] [place] [address] [phone]        MD at SNF. Schedule an appointment as soon as possible for a visit in 3 day(s).   Why:  To be seen with repeat labs (CBC & BMP).       Wenda Low, MD. Schedule an appointment as soon as possible for a visit.   Specialty:  Internal Medicine Why:  Upon discharge from SNF. Contact information: [address] [place] [address] [phone]            Contact information for after-discharge care    Destination    [facility] Follow up.   Service:  Skilled Nursing Contact information: [place] [address] [phone]                 Allergies  Allergen Reactions  . Codeine Anaphylaxis      Procedures/Studies: Ct Head Wo Contrast  Result Date: 01/18/2018 CLINICAL DATA:  Headache and neck pain. EXAM: CT HEAD WITHOUT CONTRAST CT CERVICAL SPINE WITHOUT CONTRAST TECHNIQUE: Multidetector CT imaging of the head and cervical spine was performed following the standard protocol without intravenous contrast. Multiplanar CT image reconstructions of the cervical spine were also generated. COMPARISON:  06/21/2009 MRI FINDINGS: CT HEAD FINDINGS Brain: Generalized atrophy. Chronic small-vessel ischemic changes affecting the pons, thalami, basal ganglia and hemispheric white matter. No sign of cortical or large vessel territory infarction. No definable acute insult. No mass lesion, hemorrhage, hydrocephalus or extra-axial collection. Vascular: There is atherosclerotic calcification of the major vessels at the base of the brain. Skull: Negative Sinuses/Orbits: Ordinary mucosal thickening in the paranasal sinuses with a small amount of mucoid material in the left division of the sphenoid sinus. Orbits negative. Other: None CT CERVICAL SPINE FINDINGS Alignment: Chronic curvature convex to the left. Degenerative anterolisthesis at C4-5 of 3 mm. Skull base and vertebrae: Solid fusion at the posterior elements C2-3. No primary bone lesion. Conchae cavity of the superior endplate of T1 which was present on the previous MRI, therefore obviously chronic. No sign of acute bone injury. Soft tissues and spinal canal: Negative Disc levels: Ordinary osteoarthritis at C1-2. Facet arthropathy on the right at C3-4, C4-5 and C7-T1. Facet arthropathy  on the left at C3-4 and C7-T1. Disc space narrowing with endplate osteophytes and bulging of the discs, most notable at C3-4 and C5-6. No likely critical canal or foraminal narrowing. Upper chest: No active process.  Mild scarring. Other: None IMPRESSION: Head CT: Advanced chronic small-vessel ischemic changes throughout the brain. No identifiable acute finding by CT. Cervical spine CT: Curvature and chronic degenerative changes throughout the region as outlined above. No acute finding. Electronically Signed   By: Nelson Chimes M.D.   On: 01/18/2018 11:29   Ct Cervical Spine Wo Contrast  Result Date: 01/18/2018 CLINICAL DATA:  Headache and neck pain. EXAM: CT HEAD WITHOUT CONTRAST CT CERVICAL SPINE WITHOUT CONTRAST TECHNIQUE: Multidetector CT imaging of the head and cervical  spine was performed following the standard protocol without intravenous contrast. Multiplanar CT image reconstructions of the cervical spine were also generated. COMPARISON:  06/21/2009 MRI FINDINGS: CT HEAD FINDINGS Brain: Generalized atrophy. Chronic small-vessel ischemic changes affecting the pons, thalami, basal ganglia and hemispheric white matter. No sign of cortical or large vessel territory infarction. No definable acute insult. No mass lesion, hemorrhage, hydrocephalus or extra-axial collection. Vascular: There is atherosclerotic calcification of the major vessels at the base of the brain. Skull: Negative Sinuses/Orbits: Ordinary mucosal thickening in the paranasal sinuses with a small amount of mucoid material in the left division of the sphenoid sinus. Orbits negative. Other: None CT CERVICAL SPINE FINDINGS Alignment: Chronic curvature convex to the left. Degenerative anterolisthesis at C4-5 of 3 mm. Skull base and vertebrae: Solid fusion at the posterior elements C2-3. No primary bone lesion. Conchae cavity of the superior endplate of T1 which was present on the previous MRI, therefore obviously chronic. No sign of acute bone  injury. Soft tissues and spinal canal: Negative Disc levels: Ordinary osteoarthritis at C1-2. Facet arthropathy on the right at C3-4, C4-5 and C7-T1. Facet arthropathy on the left at C3-4 and C7-T1. Disc space narrowing with endplate osteophytes and bulging of the discs, most notable at C3-4 and C5-6. No likely critical canal or foraminal narrowing. Upper chest: No active process.  Mild scarring. Other: None IMPRESSION: Head CT: Advanced chronic small-vessel ischemic changes throughout the brain. No identifiable acute finding by CT. Cervical spine CT: Curvature and chronic degenerative changes throughout the region as outlined above. No acute finding. Electronically Signed   By: Nelson Chimes M.D.   On: 01/18/2018 11:29   Chest Portable 1 View  Result Date: 01/18/2018 CLINICAL DATA:  Preop chest exam. EXAM: PORTABLE CHEST 1 VIEW COMPARISON:  Radiograph of June 10, 2014. FINDINGS: Stable cardiomegaly. Atherosclerosis of thoracic aorta is noted. New rounded density is noted medially in right lung base concerning for possible mass or neoplasm. No pneumothorax or pleural effusion is noted. No acute pulmonary disease is noted. Bony thorax is unremarkable. IMPRESSION: New rounded density seen medially in right lung base concerning for possible mass or neoplasm. CT scan of the chest is recommended for further evaluation. These results will be called to the ordering clinician or representative by the Radiologist Assistant, and communication documented in the PACS or zVision Dashboard. Aortic Atherosclerosis (ICD10-I70.0). Electronically Signed   By: Marijo Conception, M.D.   On: 01/18/2018 13:35   Dg C-arm 1-60 Min  Result Date: 01/19/2018 CLINICAL DATA:  ORIF right femur fracture EXAM: DG C-ARM 61-120 MIN; RIGHT FEMUR 2 VIEWS COMPARISON:  01/18/2018 FINDINGS: Mid femur fracture below the prosthetic stem has her been fixed with a lateral plate and multiple screws. Fracture alignment appears satisfactory. Right knee  replacement is partially imaged. IMPRESSION: Lateral plate fixation of mid femur fracture on the right. Electronically Signed   By: Franchot Gallo M.D.   On: 01/19/2018 16:52   Dg Hip Unilat W Or Wo Pelvis 2-3 Views Right  Result Date: 01/18/2018 CLINICAL DATA:  Right hip pain secondary to a fall today. Leg deformity. EXAM: DG HIP (WITH OR WITHOUT PELVIS) 2-3V RIGHT COMPARISON:  Radiographs dated 10/07/2005 FINDINGS: There is an overriding displaced spiral fracture of mid right femoral shaft. Diffuse osteopenia. No hip fracture. Right hip prosthesis is properly located. Visualized pelvic bones are intact. IMPRESSION: 1. Displaced overriding spiral fracture of the right femoral shaft. 2. No acute abnormality of the right hip. Electronically Signed   By:  Lorriane Shire M.D.   On: 01/18/2018 11:51   Dg Femur, Min 2 Views Right  Result Date: 01/19/2018 CLINICAL DATA:  ORIF right femur fracture EXAM: DG C-ARM 61-120 MIN; RIGHT FEMUR 2 VIEWS COMPARISON:  01/18/2018 FINDINGS: Mid femur fracture below the prosthetic stem has her been fixed with a lateral plate and multiple screws. Fracture alignment appears satisfactory. Right knee replacement is partially imaged. IMPRESSION: Lateral plate fixation of mid femur fracture on the right. Electronically Signed   By: Franchot Gallo M.D.   On: 01/19/2018 16:52   Dg Femur Min 2 Views Right  Result Date: 01/18/2018 CLINICAL DATA:  Right hip pain after fall. EXAM: RIGHT FEMUR 2 VIEWS COMPARISON:  None. FINDINGS: Status post right hip and knee arthroplasties. Severely displaced oblique fracture is seen involving the midportion of the right femoral shaft inferior to femoral prosthesis. IMPRESSION: Severely displaced oblique fracture involving right femoral shaft. Electronically Signed   By: Marijo Conception, M.D.   On: 01/18/2018 11:50   Dg Femur Port, Min 2 Views Right  Result Date: 01/19/2018 CLINICAL DATA:  Status post ORIF of right femoral fracture EXAM: RIGHT FEMUR  PORTABLE 2 VIEW COMPARISON:  Intraoperative films from earlier in the same day as well as a preoperative film from the previous day. FINDINGS: Right hip replacement is again noted. A fixation sideplate is now seen in the mid right femur with multiple fixation screws. Fracture fragments are in anatomic alignment. Right knee prosthesis is seen as well. IMPRESSION: Status post ORIF of right mid femoral shaft fracture Electronically Signed   By: Inez Catalina M.D.   On: 01/19/2018 20:53      Subjective: "I am okay".  Poor historian, does not Black much and no complaints reported.  Since postop, patient had not not voided despite IV fluids and had required in and out urinary catheterization for the last 2 nights.  However this morning when RN went to assist patient to bedside commode to see if she would urinate, bed was soiled with urine so patient had spontaneously voided.  Discharge Exam:  Vitals:   01/21/18 0436 01/21/18 1335 01/21/18 2113 01/22/18 0501  BP: (!) 136/55 (!) 123/57 (!) 135/53 (!) 136/54  Pulse: 74 93 96 80  Resp: '16 18 18 18  ' Temp: 98.4 F (36.9 C) 97.8 F (36.6 C) 99.3 F (37.4 C) 98.3 F (36.8 C)  TempSrc: Oral Oral Oral Oral  SpO2: 99% 100% 99% 100%  Weight:      Height:        General exam: Elderly female, small built and frail, poorly nourished, lying comfortably supine in bed.  Oral mucosa moist. Respiratory system: Clear to auscultation. Respiratory effort normal.   Cardiovascular system: S1 & S2 heard, RRR. No JVD, murmurs, rubs, gallops or clicks. No pedal edema.   Gastrointestinal system: Abdomen is nondistended, soft and nontender. No organomegaly or masses felt. Normal bowel sounds heard.  Central nervous system: Alert and oriented x2. No focal neurological deficits.  Extremities: Symmetric 5 x 5 power.  Right hip/lateral thigh postop dressing site clean and dry. Skin: No rashes, lesions or ulcers Psychiatry: Judgement and insight impaired. Mood & affect flat.        The results of significant diagnostics from this hospitalization (including imaging, microbiology, ancillary and laboratory) are listed below for reference.     Microbiology: Recent Results (from the past 240 hour(s))  Surgical PCR screen     Status: None   Collection Time: 01/18/18  6:59  PM  Result Value Ref Range Status   MRSA, PCR NEGATIVE NEGATIVE Final   Staphylococcus aureus NEGATIVE NEGATIVE Final    Comment: (NOTE) The Xpert SA Assay (FDA approved for NASAL specimens in patients 32 years of age and older), is one component of a comprehensive surveillance program. It is not intended to diagnose infection nor to guide or monitor treatment. Performed at [facility], [place] [address], [place], [place] [address]      Labs: CBC: Recent Labs  Lab 01/18/18 1159 01/19/18 0457 01/20/18 0430 01/20/18 0847 01/20/18 1829 01/21/18 0608 01/22/18 0355  WBC 22.7* 14.0* 15.3*  --   --  10.4 8.4  NEUTROABS 20.9*  --   --   --   --   --   --   HGB 11.9* 11.4* 7.6* 7.4* 8.7* 8.2* 8.2*  HCT 35.8* 34.5* 22.7* 22.6* 25.8* 24.8* 25.0*  MCV 122.6* 125.0* 124.0*  --   --  112.7* 113.6*  PLT 359 398 267  --   --  251 341   Basic Metabolic Panel: Recent Labs  Lab 01/18/18 1159 01/19/18 0457 01/20/18 0430 01/21/18 0608  NA 138 140 136 140  K 4.0 3.9 3.7 3.7  CL 100* 99* 103 105  CO2 '27 27 23 27  ' GLUCOSE 117* 78 112* 98  BUN '8 10 14 ' 22*  CREATININE 0.67 0.72 0.87 0.89  CALCIUM 8.6* 8.8* 7.8* 8.1*    Thyroid function studies Recent Labs    01/19/18 1721  TSH 2.417   Discussed in detail with patient's spouse and friend at bedside.  Updated care and answered questions.  Updated them regarding planned discharge to SNF today and they are agreeable.  Discussed with clinical social work and Tour manager.   Time coordinating discharge: Over 30 minutes  SIGNED:  Vernell Leep, MD, FACP, [facility]. [facility] Pager [phone]  If 7PM-7AM, please contact  night-coverage [email or web address] Password [facility] 01/22/2018, 11:37 AM

## 2018-01-22 NOTE — Progress Notes (Signed)
Subjective: 3 Days Post-Op Procedure(s) (LRB): OPEN REDUCTION INTERNAL FIXATION (ORIF) DISTAL FEMUR FRACTURE (Right) Patient reports pain as 4 on 0-10 scale.    Objective: Vital signs in last 24 hours: Temp:  [97.8 F (36.6 C)-99.3 F (37.4 C)] 98.3 F (36.8 C) (03/09 0501) Pulse Rate:  [80-96] 80 (03/09 0501) Resp:  [18] 18 (03/09 0501) BP: (123-136)/(53-57) 136/54 (03/09 0501) SpO2:  [99 %-100 %] 100 % (03/09 0501)  Intake/Output from previous day: 03/08 0701 - 03/09 0700 In: 1200 [I.V.:1200] Out: 220 [Urine:220] Intake/Output this shift: Total I/O In: 100 [P.O.:100] Out: -   Recent Labs    01/20/18 0430 01/20/18 0847 01/20/18 1829 01/21/18 0608 01/22/18 0355  HGB 7.6* 7.4* 8.7* 8.2* 8.2*   Recent Labs    01/21/18 0608 01/22/18 0355  WBC 10.4 8.4  RBC 2.20* 2.20*  HCT 24.8* 25.0*  PLT 251 273   Recent Labs    01/20/18 0430 01/21/18 0608  NA 136 140  K 3.7 3.7  CL 103 105  CO2 23 27  BUN 14 22*  CREATININE 0.87 0.89  GLUCOSE 112* 98  CALCIUM 7.8* 8.1*   No results for input(s): LABPT, INR in the last 72 hours.  ABD soft Neurovascular intact Sensation intact distally Intact pulses distally Incision: dressing C/D/I  Assessment/Plan: 3 Days Post-Op Procedure(s) (LRB): OPEN REDUCTION INTERNAL FIXATION (ORIF) DISTAL FEMUR FRACTURE (Right) Advance diet Up with therapy Discharge to SNF  Patient has limited mobility and I agree with SNF placement for further rehab.  Follow up with Dr Griffin Basil on 02/03/2018 at 2:15 pm   Call his office [phone] with any questions  Laurianne Floresca J 01/22/2018, 9:06 AM

## 2018-01-22 NOTE — Progress Notes (Signed)
Report given to [place] at [facility].  Tylenol given for pain.  Papers and DC instructions given.

## 2018-01-22 NOTE — Progress Notes (Signed)
Patient will DC to: [facility] [place] Anticipated DC date: 01/22/18 Family notified: spouse at bedside Transport by: Corey Harold   Per MD patient ready for DC to [facility]. RN, patient, patient's family, and facility notified of DC. Discharge Summary sent to facility. RN given number for report. DC packet on chart. Ambulance transport requested for patient.   CSW signing off.  Cedric Fishman, LCSW Clinical Social Worker [phone]

## 2018-01-26 ENCOUNTER — Encounter (HOSPITAL_COMMUNITY): Payer: Self-pay | Admitting: Orthopaedic Surgery

## 2018-03-03 ENCOUNTER — Ambulatory Visit
Admission: RE | Admit: 2018-03-03 | Discharge: 2018-03-03 | Disposition: A | Discharge status: Home / Self Care | Payer: Medicare Other | Source: Ambulatory Visit | Attending: Internal Medicine | Admitting: Internal Medicine

## 2018-03-03 ENCOUNTER — Other Ambulatory Visit: Payer: Self-pay | Admitting: Internal Medicine

## 2018-03-03 DIAGNOSIS — R911 Solitary pulmonary nodule: Secondary | ICD-10-CM

## 2018-03-03 MED ORDER — IOPAMIDOL (ISOVUE-300) INJECTION 61%
75.0000 mL | Freq: Once | INTRAVENOUS | Status: AC | PRN
  Administered 2018-03-03: 75 mL via INTRAVENOUS

## 2019-01-02 ENCOUNTER — Other Ambulatory Visit: Payer: Self-pay

## 2019-01-02 ENCOUNTER — Encounter (HOSPITAL_COMMUNITY): Payer: Self-pay

## 2019-01-02 ENCOUNTER — Emergency Department (HOSPITAL_COMMUNITY): Payer: Medicare Other

## 2019-01-02 ENCOUNTER — Inpatient Hospital Stay (HOSPITAL_COMMUNITY)
Admission: EM | Admit: 2019-01-02 | Discharge: 2019-01-06 | DRG: 281 | Disposition: A | Discharge status: Home / Self Care | Payer: Medicare Other | Attending: Internal Medicine | Admitting: Internal Medicine

## 2019-01-02 DIAGNOSIS — Z79891 Long term (current) use of opiate analgesic: Secondary | ICD-10-CM | POA: Diagnosis not present

## 2019-01-02 DIAGNOSIS — R778 Other specified abnormalities of plasma proteins: Secondary | ICD-10-CM

## 2019-01-02 DIAGNOSIS — Z981 Arthrodesis status: Secondary | ICD-10-CM | POA: Diagnosis not present

## 2019-01-02 DIAGNOSIS — G43909 Migraine, unspecified, not intractable, without status migrainosus: Secondary | ICD-10-CM | POA: Diagnosis present

## 2019-01-02 DIAGNOSIS — Z853 Personal history of malignant neoplasm of breast: Secondary | ICD-10-CM

## 2019-01-02 DIAGNOSIS — J449 Chronic obstructive pulmonary disease, unspecified: Secondary | ICD-10-CM | POA: Diagnosis present

## 2019-01-02 DIAGNOSIS — Z96653 Presence of artificial knee joint, bilateral: Secondary | ICD-10-CM | POA: Diagnosis present

## 2019-01-02 DIAGNOSIS — S72009A Fracture of unspecified part of neck of unspecified femur, initial encounter for closed fracture: Secondary | ICD-10-CM | POA: Diagnosis not present

## 2019-01-02 DIAGNOSIS — R0600 Dyspnea, unspecified: Secondary | ICD-10-CM | POA: Diagnosis not present

## 2019-01-02 DIAGNOSIS — W1830XA Fall on same level, unspecified, initial encounter: Secondary | ICD-10-CM | POA: Diagnosis present

## 2019-01-02 DIAGNOSIS — Z8711 Personal history of peptic ulcer disease: Secondary | ICD-10-CM | POA: Diagnosis not present

## 2019-01-02 DIAGNOSIS — I10 Essential (primary) hypertension: Secondary | ICD-10-CM | POA: Diagnosis not present

## 2019-01-02 DIAGNOSIS — Z23 Encounter for immunization: Secondary | ICD-10-CM

## 2019-01-02 DIAGNOSIS — Z79899 Other long term (current) drug therapy: Secondary | ICD-10-CM

## 2019-01-02 DIAGNOSIS — I21A1 Myocardial infarction type 2: Secondary | ICD-10-CM | POA: Diagnosis present

## 2019-01-02 DIAGNOSIS — R413 Other amnesia: Secondary | ICD-10-CM | POA: Diagnosis not present

## 2019-01-02 DIAGNOSIS — M25552 Pain in left hip: Secondary | ICD-10-CM | POA: Diagnosis present

## 2019-01-02 DIAGNOSIS — I160 Hypertensive urgency: Secondary | ICD-10-CM | POA: Diagnosis present

## 2019-01-02 DIAGNOSIS — Y92009 Unspecified place in unspecified non-institutional (private) residence as the place of occurrence of the external cause: Secondary | ICD-10-CM

## 2019-01-02 DIAGNOSIS — R296 Repeated falls: Secondary | ICD-10-CM | POA: Diagnosis present

## 2019-01-02 DIAGNOSIS — F0390 Unspecified dementia without behavioral disturbance: Secondary | ICD-10-CM | POA: Diagnosis present

## 2019-01-02 DIAGNOSIS — I110 Hypertensive heart disease with heart failure: Principal | ICD-10-CM | POA: Diagnosis present

## 2019-01-02 DIAGNOSIS — Z96643 Presence of artificial hip joint, bilateral: Secondary | ICD-10-CM | POA: Diagnosis present

## 2019-01-02 DIAGNOSIS — R7989 Other specified abnormal findings of blood chemistry: Secondary | ICD-10-CM

## 2019-01-02 DIAGNOSIS — I214 Non-ST elevation (NSTEMI) myocardial infarction: Secondary | ICD-10-CM | POA: Diagnosis present

## 2019-01-02 DIAGNOSIS — I5030 Unspecified diastolic (congestive) heart failure: Secondary | ICD-10-CM | POA: Diagnosis present

## 2019-01-02 DIAGNOSIS — E876 Hypokalemia: Secondary | ICD-10-CM | POA: Diagnosis present

## 2019-01-02 DIAGNOSIS — W19XXXA Unspecified fall, initial encounter: Secondary | ICD-10-CM

## 2019-01-02 DIAGNOSIS — K219 Gastro-esophageal reflux disease without esophagitis: Secondary | ICD-10-CM | POA: Diagnosis present

## 2019-01-02 DIAGNOSIS — S32592A Other specified fracture of left pubis, initial encounter for closed fracture: Secondary | ICD-10-CM | POA: Diagnosis present

## 2019-01-02 DIAGNOSIS — S0990XA Unspecified injury of head, initial encounter: Secondary | ICD-10-CM

## 2019-01-02 DIAGNOSIS — Z8249 Family history of ischemic heart disease and other diseases of the circulatory system: Secondary | ICD-10-CM

## 2019-01-02 DIAGNOSIS — R079 Chest pain, unspecified: Secondary | ICD-10-CM | POA: Diagnosis not present

## 2019-01-02 DIAGNOSIS — N179 Acute kidney failure, unspecified: Secondary | ICD-10-CM

## 2019-01-02 DIAGNOSIS — I509 Heart failure, unspecified: Secondary | ICD-10-CM | POA: Diagnosis not present

## 2019-01-02 LAB — CBC WITH DIFFERENTIAL/PLATELET
Abs Immature Granulocytes: 0.04 10*3/uL (ref 0.00–0.07)
BASOS PCT: 0 %
Basophils Absolute: 0.1 10*3/uL (ref 0.0–0.1)
EOS ABS: 0 10*3/uL (ref 0.0–0.5)
Eosinophils Relative: 0 %
HEMATOCRIT: 43.6 % (ref 36.0–46.0)
Hemoglobin: 13.8 g/dL (ref 12.0–15.0)
Immature Granulocytes: 0 %
LYMPHS ABS: 1.2 10*3/uL (ref 0.7–4.0)
Lymphocytes Relative: 10 %
MCH: 31 pg (ref 26.0–34.0)
MCHC: 31.7 g/dL (ref 30.0–36.0)
MCV: 98 fL (ref 80.0–100.0)
MONO ABS: 0.7 10*3/uL (ref 0.1–1.0)
MONOS PCT: 6 %
NEUTROS PCT: 84 %
Neutro Abs: 9.7 10*3/uL — ABNORMAL HIGH (ref 1.7–7.7)
PLATELETS: 249 10*3/uL (ref 150–400)
RBC: 4.45 MIL/uL (ref 3.87–5.11)
RDW: 13.4 % (ref 11.5–15.5)
WBC: 11.6 10*3/uL — ABNORMAL HIGH (ref 4.0–10.5)
nRBC: 0 % (ref 0.0–0.2)

## 2019-01-02 LAB — URINALYSIS, ROUTINE W REFLEX MICROSCOPIC
Bilirubin Urine: NEGATIVE
Glucose, UA: NEGATIVE mg/dL
Ketones, ur: 20 mg/dL — AB
Leukocytes,Ua: NEGATIVE
NITRITE: NEGATIVE
PH: 5 (ref 5.0–8.0)
Protein, ur: NEGATIVE mg/dL
Specific Gravity, Urine: >1.046 — ABNORMAL HIGH (ref 1.005–1.030)

## 2019-01-02 LAB — BASIC METABOLIC PANEL
ANION GAP: 14 (ref 5–15)
BUN: 14 mg/dL (ref 8–23)
CALCIUM: 9 mg/dL (ref 8.9–10.3)
CO2: 23 mmol/L (ref 22–32)
CREATININE: 1.42 mg/dL — AB (ref 0.44–1.00)
Chloride: 100 mmol/L (ref 98–111)
GFR calc Af Amer: 41 mL/min — ABNORMAL LOW (ref >60)
GFR, EST NON AFRICAN AMERICAN: 35 mL/min — AB (ref >60)
GLUCOSE: 116 mg/dL — AB (ref 70–99)
Potassium: 4 mmol/L (ref 3.5–5.1)
Sodium: 137 mmol/L (ref 135–145)

## 2019-01-02 LAB — LIPID PANEL
CHOL/HDL RATIO: 3.1 ratio
Cholesterol: 126 mg/dL (ref 0–200)
HDL: 41 mg/dL (ref >40)
LDL Cholesterol: 63 mg/dL (ref 0–99)
Triglycerides: 110 mg/dL (ref <150)
VLDL: 22 mg/dL (ref 0–40)

## 2019-01-02 LAB — I-STAT TROPONIN, ED: Troponin i, poc: 0.19 ng/mL (ref 0.00–0.08)

## 2019-01-02 LAB — BRAIN NATRIURETIC PEPTIDE: B Natriuretic Peptide: 518.5 pg/mL — ABNORMAL HIGH (ref 0.0–100.0)

## 2019-01-02 LAB — TROPONIN I
Troponin I: 0.21 ng/mL (ref <0.03)
Troponin I: 0.13 ng/mL (ref <0.03)

## 2019-01-02 MED ORDER — FENTANYL CITRATE (PF) 100 MCG/2ML IJ SOLN
25.0000 ug | Freq: Once | INTRAMUSCULAR | Status: AC
  Administered 2019-01-02: 25 ug via INTRAVENOUS
  Filled 2019-01-02: qty 2

## 2019-01-02 MED ORDER — ASPIRIN 81 MG PO CHEW
324.0000 mg | CHEWABLE_TABLET | Freq: Once | ORAL | Status: AC
  Administered 2019-01-02: 324 mg via ORAL
  Filled 2019-01-02: qty 4

## 2019-01-02 MED ORDER — FUROSEMIDE 10 MG/ML IJ SOLN
40.0000 mg | Freq: Once | INTRAMUSCULAR | Status: AC
  Administered 2019-01-02: 40 mg via INTRAVENOUS
  Filled 2019-01-02: qty 4

## 2019-01-02 MED ORDER — MIRTAZAPINE 15 MG PO TABS
15.0000 mg | ORAL_TABLET | Freq: Every day | ORAL | Status: DC
  Administered 2019-01-02 – 2019-01-05 (×4): 15 mg via ORAL
  Filled 2019-01-02 (×5): qty 1

## 2019-01-02 MED ORDER — CARVEDILOL 3.125 MG PO TABS: 3.1250 mg | ORAL_TABLET | Freq: Two times a day (BID) | ORAL | Status: DC

## 2019-01-02 MED ORDER — IOPAMIDOL (ISOVUE-370) INJECTION 76%
INTRAVENOUS | Status: AC
  Filled 2019-01-02: qty 100

## 2019-01-02 MED ORDER — IPRATROPIUM-ALBUTEROL 0.5-2.5 (3) MG/3ML IN SOLN
3.0000 mL | Freq: Once | RESPIRATORY_TRACT | Status: AC
  Administered 2019-01-02: 3 mL via RESPIRATORY_TRACT
  Filled 2019-01-02: qty 3

## 2019-01-02 MED ORDER — ACETAMINOPHEN 325 MG PO TABS
650.0000 mg | ORAL_TABLET | ORAL | Status: DC | PRN
  Administered 2019-01-03 – 2019-01-06 (×4): 650 mg via ORAL
  Filled 2019-01-02 (×5): qty 2

## 2019-01-02 MED ORDER — BENAZEPRIL HCL 40 MG PO TABS
40.0000 mg | ORAL_TABLET | Freq: Every day | ORAL | Status: DC
  Administered 2019-01-02 – 2019-01-06 (×5): 40 mg via ORAL
  Filled 2019-01-02: qty 4
  Filled 2019-01-02: qty 1
  Filled 2019-01-02: qty 4
  Filled 2019-01-02 (×2): qty 1
  Filled 2019-01-02 (×2): qty 4
  Filled 2019-01-02 (×2): qty 1

## 2019-01-02 MED ORDER — ASPIRIN EC 81 MG PO TBEC
81.0000 mg | DELAYED_RELEASE_TABLET | Freq: Every day | ORAL | Status: DC
  Administered 2019-01-03 – 2019-01-06 (×4): 81 mg via ORAL
  Filled 2019-01-02 (×4): qty 1

## 2019-01-02 MED ORDER — FENTANYL CITRATE (PF) 100 MCG/2ML IJ SOLN: 25.0000 ug | Freq: Once | INTRAMUSCULAR | Status: DC

## 2019-01-02 MED ORDER — ONDANSETRON HCL 4 MG/2ML IJ SOLN: 4.0000 mg | Freq: Four times a day (QID) | INTRAMUSCULAR | Status: DC | PRN

## 2019-01-02 MED ORDER — IOPAMIDOL (ISOVUE-370) INJECTION 76%
50.0000 mL | Freq: Once | INTRAVENOUS | Status: AC | PRN
  Administered 2019-01-02: 50 mL via INTRAVENOUS

## 2019-01-02 MED ORDER — HYDROMORPHONE HCL 1 MG/ML IJ SOLN
0.5000 mg | INTRAMUSCULAR | Status: DC | PRN
  Administered 2019-01-02: 0.5 mg via INTRAVENOUS
  Filled 2019-01-02: qty 1

## 2019-01-02 MED ORDER — NITROGLYCERIN 0.4 MG SL SUBL: 0.4000 mg | SUBLINGUAL_TABLET | SUBLINGUAL | Status: DC | PRN

## 2019-01-02 MED ORDER — IOPAMIDOL (ISOVUE-370) INJECTION 76%
INTRAVENOUS | Status: AC
  Filled 2019-01-02: qty 50

## 2019-01-02 MED ORDER — METOPROLOL TARTRATE 25 MG PO TABS
25.0000 mg | ORAL_TABLET | Freq: Two times a day (BID) | ORAL | Status: DC
  Administered 2019-01-02 – 2019-01-04 (×4): 25 mg via ORAL
  Filled 2019-01-02 (×4): qty 1

## 2019-01-02 MED ORDER — ENSURE ENLIVE PO LIQD
237.0000 mL | Freq: Three times a day (TID) | ORAL | Status: DC
  Administered 2019-01-03 – 2019-01-06 (×6): 237 mL via ORAL

## 2019-01-02 MED ORDER — HEPARIN (PORCINE) 25000 UT/250ML-% IV SOLN
950.0000 [IU]/h | INTRAVENOUS | Status: DC
  Administered 2019-01-02: 750 [IU]/h via INTRAVENOUS
  Filled 2019-01-02: qty 250

## 2019-01-02 NOTE — Consult Note (Signed)
ORTHOPAEDIC CONSULTATION  REQUESTING PHYSICIAN: Oren Binet*  PCP:  Wenda Low, MD  Chief Complaint: Left hip pain  HPI: [name] is a 78 y.o. female who complains of left hip pain.  She has dementia at baseline and is accompanied by her husband and son who will provide the history.  She comes in today to the emergency department following a fall 2 days ago.  She was having left hip pain with weightbearing.  She does walk intermittently with a walker and sometimes without.  She does live independently with her husband who helps provide assistance for her given her dementia.  She denies smoking or diabetes.  While in the emergency department she developed some shortness of breath on exertion.  That is being managed as is her overall hospitalization by the internal medicine doctors.  Past Medical History:  Diagnosis Date  . Arthritis    "knees" (01/18/2018)  . Breast cancer, left breast ([place])   . Chronic bronchitis ([place])   . COPD (chronic obstructive pulmonary disease) ([place])   . GERD (gastroesophageal reflux disease)   . Headache    "just about qd" (01/18/2018)  . History of hiatal hernia   . History of stomach ulcers   . Hypertension   . Memory difficulty   . Migraine    "once/month maybe" (01/18/2018)  . Pneumonia    "maybe twice" (01/18/2018)  . Thyroid disease    Past Surgical History:  Procedure Laterality Date  . ABDOMINAL HYSTERECTOMY     "partial"  . APPENDECTOMY    . BACK SURGERY    . BREAST LUMPECTOMY Left   . DILATION AND CURETTAGE OF UTERUS    . FEMUR FRACTURE SURGERY Left 22/3006   Archie Endo 04/01/2011  . I&D KNEE WITH POLY EXCHANGE Right 12/19/2002   Archie Endo 04/01/2011  . JOINT REPLACEMENT    . KNEE ARTHROSCOPY Left 09/2001   with popliteal cyst excision and synovectomy Ar[facility] Endo 04/01/2011  . LAPAROSCOPIC CHOLECYSTECTOMY  11/2004   Archie Endo 04/01/2011  . LUMBAR FUSION  05/2001   Ar[facility] Endo 04/01/2011  . ORIF FEMUR FRACTURE Right 01/19/2018   Procedure: OPEN REDUCTION INTERNAL FIXATION (ORIF) DISTAL FEMUR FRACTURE;  Surgeon: Hiram Gash, MD;  Location: [facility];  Service: Orthopedics;  Laterality: Right;  . TONSILLECTOMY    . TOTAL HIP ARTHROPLASTY Bilateral   . TOTAL HIP REVISION Left 04/2003   Archie Endo 04/01/2011  . TOTAL KNEE ARTHROPLASTY Bilateral 07/2000   Ar[facility] Endo 04/01/2011  . TOTAL KNEE REVISION Left 08/2003   Ar[facility] Endo 04/01/2011  . WRIST SURGERY Right    "? break"   Social History   Socioeconomic History  . Marital status: Married    Spouse name: Not on file  . Number of children: Not on file  . Years of education: Not on file  . Highest education level: Not on file  Occupational History  . Not on file  Social Needs  . Financial resource strain: Not on file  . Food insecurity:    Worry: Not on file    Inability: Not on file  . Transportation needs:    Medical: Not on file    Non-medical: Not on file  Tobacco Use  . Smoking status: Never Smoker  . Smokeless tobacco: Never Used  Substance and Sexual Activity  . Alcohol use: No  . Drug use: No  . Sexual activity: Not on file  Lifestyle  . Physical activity:    Days per week: Not on file    Minutes per  session: Not on file  . Stress: Not on file  Relationships  . Social connections:    Talks on phone: Not on file    Gets together: Not on file    Attends religious service: Not on file    Active member of club or organization: Not on file    Attends meetings of clubs or organizations: Not on file    Relationship status: Not on file  Other Topics Concern  . Not on file  Social History Narrative  . Not on file   Family History  Problem Relation Age of Onset  . CAD Father        "heart attack" in early 31's.  . Atrial fibrillation Son    Allergies  Allergen Reactions  . Codeine Anaphylaxis   Prior to Admission medications   Medication Sig Start Date End Date Taking? Authorizing Provider  benazepril (LOTENSIN) 40 MG tablet Take 40 mg by mouth daily.  12/28/18  Yes [provider]  mirtazapine (REMERON) 15 MG tablet Take 15 mg by mouth at bedtime. 10/02/18  Yes [provider]  celecoxib (CELEBREX) 100 MG capsule Take 1 capsule (100 mg total) by mouth daily. Patient not taking: Reported on 01/02/2019 01/20/18 01/20/19  Hiram Gash, MD  docusate sodium (COLACE) 100 MG capsule Take 1 capsule (100 mg total) by mouth 2 (two) times daily. Patient not taking: Reported on 01/02/2019 01/22/18   Modena Jansky, MD  enoxaparin (LOVENOX) 40 MG/0.4ML injection Inject 0.4 mLs (40 mg total) into the skin daily. Patient not taking: Reported on 01/02/2019 01/20/18 01/20/19  Hiram Gash, MD  feeding supplement, ENSURE ENLIVE, (ENSURE ENLIVE) LIQD Take 237 mLs by mouth 3 (three) times daily between meals. 01/22/18   Hongalgi, Lenis Dickinson, MD  Multiple Vitamin (MULTIVITAMIN WITH MINERALS) TABS tablet Take 1 tablet by mouth daily. Patient not taking: Reported on 01/02/2019 01/22/18   Modena Jansky, MD  senna-docusate (SENOKOT-S) 8.6-50 MG tablet Take 1 tablet by mouth at bedtime as needed for mild constipation. Patient not taking: Reported on 01/02/2019 01/22/18   Modena Jansky, MD  traMADol (ULTRAM) 50 MG tablet Take 1 tablet (50 mg total) by mouth every 6 (six) hours as needed. Patient not taking: Reported on 01/02/2019 01/20/18 01/20/19  Hiram Gash, MD   Dg Chest 1 View  Result Date: 01/02/2019 CLINICAL DATA:  Shortness of breath, fall. EXAM: CHEST - 2 VIEW COMPARISON:  Radiograph of January 18, 2018. FINDINGS: Stable cardiomegaly. Large hiatal hernia is noted. No pneumothorax or pleural effusion is noted. No acute pulmonary disease is noted. Bony thorax is unremarkable. IMPRESSION: No active cardiopulmonary disease.  Large hiatal hernia. Electronically Signed   By: Marijo Conception, M.D.   On: 01/02/2019 15:47   Dg Lumbar Spine Complete  Result Date: 01/02/2019 CLINICAL DATA:  Low back pain after fall. EXAM: LUMBAR SPINE - COMPLETE 4+ VIEW COMPARISON:   Radiographs of October 06, 2005. FINDINGS: Status post surgical posterior fusion of L3-4 and L4-5 with bilateral intrapedicular screw placement. Stable grade 1 anterolisthesis of L4-5 is noted. Moderate degenerative disc disease is noted at L1-2 and L5-S1. No acute fracture is noted. Diffuse osteopenia is noted. IMPRESSION: Postsurgical and degenerative changes as described above. No acute abnormality seen in the lumbar spine. Electronically Signed   By: Marijo Conception, M.D.   On: 01/02/2019 15:49   Dg Sacrum/coccyx  Result Date: 01/02/2019 CLINICAL DATA:  Low back pain after fall. EXAM: SACRUM AND COCCYX -  2+ VIEW COMPARISON:  None. FINDINGS: Mildly displaced fracture is seen involving the left superior pubic ramus. Status post bilateral hip prostheses. The sacrum is unremarkable. Sacroiliac joints appear normal. IMPRESSION: Mildly displaced fracture involving the left superior pubic ramus. Sacrum and coccyx are unremarkable. Electronically Signed   By: Marijo Conception, M.D.   On: 01/02/2019 15:51   Ct Head Wo Contrast  Result Date: 01/02/2019 CLINICAL DATA:  Dizziness, fall. EXAM: CT HEAD WITHOUT CONTRAST TECHNIQUE: Contiguous axial images were obtained from the base of the skull through the vertex without intravenous contrast. COMPARISON:  CT scan of January 18, 2018. FINDINGS: Brain: Mild diffuse cortical atrophy is noted. Mild chronic ischemic white matter disease is noted. Old lacunar infarction is noted in right basal ganglia. No mass effect or midline shift is noted. Ventricular size is within normal limits. There is no evidence of mass lesion, hemorrhage or acute infarction. Vascular: No hyperdense vessel or unexpected calcification. Skull: Normal. Negative for fracture or focal lesion. Sinuses/Orbits: No acute finding. Other: None. IMPRESSION: Mild diffuse cortical atrophy. Mild chronic ischemic white matter disease. No acute intracranial abnormality seen. Electronically Signed   By: Marijo Conception, M.D.   On: 01/02/2019 16:33   Ct Angio Chest Pe W/cm &/or Wo Cm  Result Date: 01/02/2019 CLINICAL DATA:  Fall 2 days ago.  Now with back pain. EXAM: CT ANGIOGRAPHY CHEST WITH CONTRAST TECHNIQUE: Multidetector CT imaging of the chest was performed using the standard protocol during bolus administration of intravenous contrast. Multiplanar CT image reconstructions and MIPs were obtained to evaluate the vascular anatomy. CONTRAST:  5mL ISOVUE-370 IOPAMIDOL (ISOVUE-370) INJECTION 76% COMPARISON:  Current chest radiographs.  Chest CT, 03/03/2018. FINDINGS: Cardiovascular: There is satisfactory opacification of the pulmonary arteries to the segmental level. There is no evidence of a pulmonary embolism. Heart is mildly enlarged. Trace pericardial effusion. Mild left coronary artery calcifications. Great vessels normal in caliber. There is aortic atherosclerosis, but no dissection aortic arch branch vessels demonstrate mild atherosclerosis, without significant stenosis. Left vertebral artery has its origin from aortic arch. Mediastinum/Nodes: Large hernia includes the entire stomach as well as portions of the colon. No bowel incarceration or strangulation. Mild distention of the esophagus above the herniated stomach. No esophageal wall thickening or mass. Trachea is unremarkable. No neck base, axillary, mediastinal or hilar masses. No pathologically enlarged lymph nodes. Lungs/Pleura: Bilateral interstitial thickening. Mosaic attenuation is noted bilaterally. There is confluent opacity in the lower lobes adjacent to the large hernia, consistent with atelectasis. No pleural effusion.  No pneumothorax. Upper Abdomen: No acute finding. Musculoskeletal: No acute fracture. No osteoblastic or osteolytic lesions. Review of the MIP images confirms the above findings. IMPRESSION: 1. No evidence of a pulmonary embolism. 2. Compressive atelectasis of the medial lower lobes adjacent to a large hernia, centered at the  esophageal hiatus. Hernia contains the stomach and portions of the colon. 3. No convincing pneumonia. Lungs show mosaic attenuation and mild interstitial thickening, new since the prior CT. Possible mild interstitial edema. Mosaic attenuation is more likely due to small airways disease. 4. No acute fracture. 5. Coronary artery calcifications.  Aortic atherosclerosis. Aortic Atherosclerosis (ICD10-I70.0). Electronically Signed   By: Lajean Manes M.D.   On: 01/02/2019 16:34   Dg Hip Unilat W Or Wo Pelvis 2-3 Views Left  Result Date: 01/02/2019 CLINICAL DATA:  Left hip pain after fall. EXAM: DG HIP (WITH OR WITHOUT PELVIS) 2-3V LEFT COMPARISON:  None. FINDINGS: Moderately displaced fracture is seen involving the left superior  pubic ramus near the acetabulum. Status post bilateral hip arthroplasties. IMPRESSION: Moderately displaced fracture seen involving the left superior pubic ramus. Electronically Signed   By: Marijo Conception, M.D.   On: 01/02/2019 15:52    Positive ROS: All other systems have been reviewed and were otherwise negative with the exception of those mentioned in the HPI and as above.  Physical Exam: General: Oriented to person and place but not time no acute distress Cardiovascular: No pedal edema Respiratory: No cyanosis, no use of accessory musculature GI: No organomegaly, abdomen is soft and non-tender Skin: No lesions in the area of chief complaint Neurologic: Sensation intact distally Lymphatic: No axillary or cervical lymphadenopathy  MUSCULOSKELETAL:  At the left hip no pain with gentle logroll.  She is able to do a Stinchfield maneuver with some pain.  Otherwise she endorses sensation intact light touch throughout the foot and ankle.  Motor is intact.  2+ dorsalis pedis pulse.  Assessment: Left root of ramus fracture, closed.  Plan: -Our plan from an orthopedic surgery standpoint will be for nonoperative management.  She can weight-bear as tolerated on the left lower  extremity.  She should get up with physical therapy for dispositional planning. -I will see her in the office in 2 weeks with weightbearing x-rays of the left hip.    Nicholes Stairs, MD Cell [phone]    01/02/2019 9:24 PM

## 2019-01-02 NOTE — Progress Notes (Signed)
ANTICOAGULATION CONSULT NOTE - Initial Consult  Pharmacy Consult for heparin Indication: chest pain/ACS  Allergies  Allergen Reactions  . Codeine Anaphylaxis    Patient Measurements: Height: 5' 4.75" (164.5 cm) Weight: 135 lb (61.2 kg) IBW/kg (Calculated) : 56.43 Heparin Dosing Weight: 61kg  Vital Signs: Temp: 98.2 F (36.8 C) (02/17 1044) Temp Source: Oral (02/17 1044) BP: 193/142 (02/17 1900) Pulse Rate: 101 (02/17 1900)  Labs: Recent Labs    01/02/19 1400  HGB 13.8  HCT 43.6  PLT 249  CREATININE 1.42*  TROPONINI 0.21*    Estimated Creatinine Clearance: 28.6 mL/min (A) (by C-G formula based on SCr of 1.42 mg/dL (H)).   Medical History: Past Medical History:  Diagnosis Date  . Arthritis    "knees" (01/18/2018)  . Breast cancer, left breast ([place])   . Chronic bronchitis ([place])   . COPD (chronic obstructive pulmonary disease) ([place])   . GERD (gastroesophageal reflux disease)   . Headache    "just about qd" (01/18/2018)  . History of hiatal hernia   . History of stomach ulcers   . Hypertension   . Memory difficulty   . Migraine    "once/month maybe" (01/18/2018)  . Pneumonia    "maybe twice" (01/18/2018)  . Thyroid disease     Assessment: 51 YOF with EKG changes, elevated troponin, also with acute pelvic fracture, no anticoagulation PTA, CBC wnl. Goal of Therapy:  Heparin level 0.3-0.7 units/ml Monitor platelets by anticoagulation protocol: Yes   Plan:  Heparin gtt at 750 units/hr, no bolus d/t fracture F/u heparin level with AM labs ~ 8 hours  Bertis Ruddy, PharmD Clinical Pharmacist Please check AMION for all [facility] numbers 01/02/2019 7:37 PM

## 2019-01-02 NOTE — Consult Note (Addendum)
Cardiology Consult    Patient ID: [name] MRN: [id number], DOB/AGE: 1941/08/04   Admit date: 01/02/2019 Date of Consult: 01/02/2019  Primary Physician: Wenda Low, MD Primary Cardiologist: New Consult (Dr. Harrington Challenger). Requesting Provider: Delia Heady, PA-C (Emergency Department).  Patient Profile    [name] is a 78 y.o. female with a history of hypertension who is being seen today for the evaluation of CHF and elevated troponin at the request of Dr. Ocie Doyne, PA-C (Emergency Department).  History of Present Illness    [name] is a 78 year old female with the above history with no known cardiac history. Patient states she previously had a stress test about 12 years ago which was reportedly normal. She has never seen a Cardiologist since that time.   Patient presented to the [facility] ED for evaluation of hip pain sustaining a fall on 12/31/2018. Patient was standing at the sink and states she fell hitting the back of her head on the refrigerator. Patient is not sure what caused her to fall. She denies any prodromal symptoms including chest pain, shortness of breath, palpitations, lightheadedness, and dizziness. Patient usually ambulates with a walker and has noticed increased dyspnea on exertion as well as dizziness since the fall. She also has had left hip pain since the fall. Patient reportedly has about 1 fall per week normal due to weakness while ambulating. Patient denies any history of chest pain but her husband states she has been complaining of some chest pain both at rest and with exertion for the last 3-4 weeks. Husband states patient has said it feels like indigestion. Patient has no recollection of this. Patient also has had some orthopnea, PND, and feet swelling at night.   In the ED, EKG showed normal sinus rhythm with nonspecific ST/T changes. Initial troponin elevated at 0.21. BNP elevated at 518.5. Chest CTA showed no evidence of PE. WBC 11.6, Hgb  13.8, Plts 249. Na 137, K 4.0, Glucose 116, SCr 1.42 (baseline 0.70 - 0.80).  At the time of this evaluation, patient's only complaint is left hip pain and difficulty urinating.   Patient denies any history of tobacco use. She does have a family history of heart disease with her father having a heart attack in his early 3's and her son having atrial fibrillation.   Past Medical History   Past Medical History:  Diagnosis Date  . Arthritis    "knees" (01/18/2018)  . Breast cancer, left breast ([place])   . Chronic bronchitis ([place])   . COPD (chronic obstructive pulmonary disease) ([place])   . GERD (gastroesophageal reflux disease)   . Headache    "just about qd" (01/18/2018)  . History of hiatal hernia   . History of stomach ulcers   . Hypertension   . Memory difficulty   . Migraine    "once/month maybe" (01/18/2018)  . Pneumonia    "maybe twice" (01/18/2018)  . Thyroid disease     Past Surgical History:  Procedure Laterality Date  . ABDOMINAL HYSTERECTOMY     "partial"  . APPENDECTOMY    . BACK SURGERY    . BREAST LUMPECTOMY Left   . DILATION AND CURETTAGE OF UTERUS    . FEMUR FRACTURE SURGERY Left 22/3006   Archie Endo 04/01/2011  . I&D KNEE WITH POLY EXCHANGE Right 12/19/2002   Archie Endo 04/01/2011  . JOINT REPLACEMENT    . KNEE ARTHROSCOPY Left 09/2001   with popliteal cyst excision and synovectomy Archie Endo 04/01/2011  .  LAPAROSCOPIC CHOLECYSTECTOMY  11/2004   Archie Endo 04/01/2011  . LUMBAR FUSION  05/2001   Archie Endo 04/01/2011  . ORIF FEMUR FRACTURE Right 01/19/2018   Procedure: OPEN REDUCTION INTERNAL FIXATION (ORIF) DISTAL FEMUR FRACTURE;  Surgeon: Hiram Gash, MD;  Location: [facility];  Service: Orthopedics;  Laterality: Right;  . TONSILLECTOMY    . TOTAL HIP ARTHROPLASTY Bilateral   . TOTAL HIP REVISION Left 04/2003   Archie Endo 04/01/2011  . TOTAL KNEE ARTHROPLASTY Bilateral 07/2000   Archie Endo 04/01/2011  . TOTAL KNEE REVISION Left 08/2003   Archie Endo 04/01/2011  . WRIST SURGERY Right    "? break"       Allergies  Allergies  Allergen Reactions  . Codeine Anaphylaxis    Inpatient Medications    . iopamidol      . iopamidol        Family History    Family History  Problem Relation Age of Onset  . CAD Father        "heart attack" in early 42's.  . Atrial fibrillation Son    She indicated that the status of her father is unknown. She indicated that her son is alive.   Social History    Social History   Socioeconomic History  . Marital status: Married    Spouse name: Not on file  . Number of children: Not on file  . Years of education: Not on file  . Highest education level: Not on file  Occupational History  . Not on file  Social Needs  . Financial resource strain: Not on file  . Food insecurity:    Worry: Not on file    Inability: Not on file  . Transportation needs:    Medical: Not on file    Non-medical: Not on file  Tobacco Use  . Smoking status: Never Smoker  . Smokeless tobacco: Never Used  Substance and Sexual Activity  . Alcohol use: No  . Drug use: No  . Sexual activity: Not on file  Lifestyle  . Physical activity:    Days per week: Not on file    Minutes per session: Not on file  . Stress: Not on file  Relationships  . Social connections:    Talks on phone: Not on file    Gets together: Not on file    Attends religious service: Not on file    Active member of club or organization: Not on file    Attends meetings of clubs or organizations: Not on file    Relationship status: Not on file  . Intimate partner violence:    Fear of current or ex partner: Not on file    Emotionally abused: Not on file    Physically abused: Not on file    Forced sexual activity: Not on file  Other Topics Concern  . Not on file  Social History Narrative  . Not on file     Review of Systems    ROS  Physical Exam    Blood pressure (!) 184/105, pulse (!) 102, temperature 98.2 F (36.8 C), temperature source Oral, resp. rate (!) 23, SpO2 96 %.  General:  78 y.o. Caucasian female resting comfortably in no acute distress. Pleasant and cooperative. HEENT: Normal  Neck: Supple. No carotid bruits or JVD appreciated. Lungs: No increased work of breathing. Clear to auscultation bilaterally. No significant wheezes, rhonchi, or rales. Heart: Tachycardic with regular rhythm. Distinct S1 and S2. No murmurs, gallops, or rubs.  Abdomen: Soft, non-distended, and non-tender to  palpation. Bowel sounds present.   Extremities: No significant lower extremity edema. Radial pulses and distal pedal pulses 2+ and equal bilaterally. Skin: Warm and dry. Neuro: Alert and oriented x3. No focal deficits. Moves all extremities spontaneously. Psych: Normal affect.  Labs    Troponin [facility] Test) Recent Labs    01/02/19 1412  TROPIPOC 0.19*   Recent Labs    01/02/19 1400  TROPONINI 0.21*   Lab Results  Component Value Date   WBC 11.6 (H) 01/02/2019   HGB 13.8 01/02/2019   HCT 43.6 01/02/2019   MCV 98.0 01/02/2019   PLT 249 01/02/2019    Recent Labs  Lab 01/02/19 1400  NA 137  K 4.0  CL 100  CO2 23  BUN 14  CREATININE 1.42*  CALCIUM 9.0  GLUCOSE 116*   No results found for: CHOL, HDL, LDLCALC, TRIG No results found for: [facility]   Radiology Studies    Dg Chest 1 View  Result Date: 01/02/2019 CLINICAL DATA:  Shortness of breath, fall. EXAM: CHEST - 2 VIEW COMPARISON:  Radiograph of January 18, 2018. FINDINGS: Stable cardiomegaly. Large hiatal hernia is noted. No pneumothorax or pleural effusion is noted. No acute pulmonary disease is noted. Bony thorax is unremarkable. IMPRESSION: No active cardiopulmonary disease.  Large hiatal hernia. Electronically Signed   By: Marijo Conception, M.D.   On: 01/02/2019 15:47   Dg Lumbar Spine Complete  Result Date: 01/02/2019 CLINICAL DATA:  Low back pain after fall. EXAM: LUMBAR SPINE - COMPLETE 4+ VIEW COMPARISON:  Radiographs of October 06, 2005. FINDINGS: Status post surgical posterior fusion of L3-4 and  L4-5 with bilateral intrapedicular screw placement. Stable grade 1 anterolisthesis of L4-5 is noted. Moderate degenerative disc disease is noted at L1-2 and L5-S1. No acute fracture is noted. Diffuse osteopenia is noted. IMPRESSION: Postsurgical and degenerative changes as described above. No acute abnormality seen in the lumbar spine. Electronically Signed   By: Marijo Conception, M.D.   On: 01/02/2019 15:49   Dg Sacrum/coccyx  Result Date: 01/02/2019 CLINICAL DATA:  Low back pain after fall. EXAM: SACRUM AND COCCYX - 2+ VIEW COMPARISON:  None. FINDINGS: Mildly displaced fracture is seen involving the left superior pubic ramus. Status post bilateral hip prostheses. The sacrum is unremarkable. Sacroiliac joints appear normal. IMPRESSION: Mildly displaced fracture involving the left superior pubic ramus. Sacrum and coccyx are unremarkable. Electronically Signed   By: Marijo Conception, M.D.   On: 01/02/2019 15:51   Ct Head Wo Contrast  Result Date: 01/02/2019 CLINICAL DATA:  Dizziness, fall. EXAM: CT HEAD WITHOUT CONTRAST TECHNIQUE: Contiguous axial images were obtained from the base of the skull through the vertex without intravenous contrast. COMPARISON:  CT scan of January 18, 2018. FINDINGS: Brain: Mild diffuse cortical atrophy is noted. Mild chronic ischemic white matter disease is noted. Old lacunar infarction is noted in right basal ganglia. No mass effect or midline shift is noted. Ventricular size is within normal limits. There is no evidence of mass lesion, hemorrhage or acute infarction. Vascular: No hyperdense vessel or unexpected calcification. Skull: Normal. Negative for fracture or focal lesion. Sinuses/Orbits: No acute finding. Other: None. IMPRESSION: Mild diffuse cortical atrophy. Mild chronic ischemic white matter disease. No acute intracranial abnormality seen. Electronically Signed   By: Marijo Conception, M.D.   On: 01/02/2019 16:33   Ct Angio Chest Pe W/cm &/or Wo Cm  Result Date:  01/02/2019 CLINICAL DATA:  Fall 2 days ago.  Now with back pain.  EXAM: CT ANGIOGRAPHY CHEST WITH CONTRAST TECHNIQUE: Multidetector CT imaging of the chest was performed using the standard protocol during bolus administration of intravenous contrast. Multiplanar CT image reconstructions and MIPs were obtained to evaluate the vascular anatomy. CONTRAST:  67mL ISOVUE-370 IOPAMIDOL (ISOVUE-370) INJECTION 76% COMPARISON:  Current chest radiographs.  Chest CT, 03/03/2018. FINDINGS: Cardiovascular: There is satisfactory opacification of the pulmonary arteries to the segmental level. There is no evidence of a pulmonary embolism. Heart is mildly enlarged. Trace pericardial effusion. Mild left coronary artery calcifications. Great vessels normal in caliber. There is aortic atherosclerosis, but no dissection aortic arch branch vessels demonstrate mild atherosclerosis, without significant stenosis. Left vertebral artery has its origin from aortic arch. Mediastinum/Nodes: Large hernia includes the entire stomach as well as portions of the colon. No bowel incarceration or strangulation. Mild distention of the esophagus above the herniated stomach. No esophageal wall thickening or mass. Trachea is unremarkable. No neck base, axillary, mediastinal or hilar masses. No pathologically enlarged lymph nodes. Lungs/Pleura: Bilateral interstitial thickening. Mosaic attenuation is noted bilaterally. There is confluent opacity in the lower lobes adjacent to the large hernia, consistent with atelectasis. No pleural effusion.  No pneumothorax. Upper Abdomen: No acute finding. Musculoskeletal: No acute fracture. No osteoblastic or osteolytic lesions. Review of the MIP images confirms the above findings. IMPRESSION: 1. No evidence of a pulmonary embolism. 2. Compressive atelectasis of the medial lower lobes adjacent to a large hernia, centered at the esophageal hiatus. Hernia contains the stomach and portions of the colon. 3. No convincing  pneumonia. Lungs show mosaic attenuation and mild interstitial thickening, new since the prior CT. Possible mild interstitial edema. Mosaic attenuation is more likely due to small airways disease. 4. No acute fracture. 5. Coronary artery calcifications.  Aortic atherosclerosis. Aortic Atherosclerosis (ICD10-I70.0). Electronically Signed   By: Lajean Manes M.D.   On: 01/02/2019 16:34   Dg Hip Unilat W Or Wo Pelvis 2-3 Views Left  Result Date: 01/02/2019 CLINICAL DATA:  Left hip pain after fall. EXAM: DG HIP (WITH OR WITHOUT PELVIS) 2-3V LEFT COMPARISON:  None. FINDINGS: Moderately displaced fracture is seen involving the left superior pubic ramus near the acetabulum. Status post bilateral hip arthroplasties. IMPRESSION: Moderately displaced fracture seen involving the left superior pubic ramus. Electronically Signed   By: Marijo Conception, M.D.   On: 01/02/2019 15:52    EKG     EKG: EKG was personally reviewed and demonstrates: Normal sinus rhythm with LVH and  nonspecific ST/T changes.   Telemetry: Telemetry was personally reviewed and demonstrates: Sinus rhythm with heart rates in the 80's to 110's with PVCs.  Cardiac Imaging    None  Assessment & Plan    Chest Pain with Elevated Troponin  - Patient presented to the ED for evaluation of hip pain after suffering a fall a couple of days ago. Found to have elevated troponin. Patient denies any chest pain but husband states she has complained of chest pain both a rest and with exertion over the last 3-4 weeks. Patient has no recollection of this.  - EKG showed non-specific ST/T changes.  - Troponin I elevated at 0.21 in the ED. Will continue to trend. - Chest CTA showed no evidence of PE but did note mild left CAD calcifications and aortic atherosclerosis.  - Will check Echo. - Will check lipid panel and hemoglobin A1c. - Will start aspirin and low dose beta-blocker (Coreg 3.125mg  twice daily). - Further ischemic evaluation depending on Echo  results and whether Ortho is  planning for surgery for pelvic fracture.    Acute CHF - Patient reports increased dyspnea on exertion since fall as well as orthopnea, PND, and pedal edema. - Echo pending.  - BNP elevated at 518.5. - Patient does not appear significantly volume overloaded on exam. - Patient got one dose of IV Lasix 40mg  in the ED. Will give another dose of IV 40mg  tonight and reassess volume status and renal function in the morning. - Continue to monitor daily weights, strict I/O's, and renal function.  Hypertension  - BP elevated at 184/105.  - Patient has a history of hypertension but does not appear to be on any medications. - Will start low dose beta-blocker as above. - Will continue to monitor.  AKI - Serum creatinine 1.42 on admission. Baseline appears to be 0.70 to 0.80. - Avoid nephrotoxic agents. - Will recheck BMET tomorrow morning.    Signed, Darreld Mclean, PA-C 01/02/2019, 6:35 PM  Pt seen and examined   I agree with findings as noted above by C Goodrich Pt is a 78 yo with no prior cardiac hx   Presents to ED after fall, pelvic fx Complains of SOB    Husband says that patient hs been complaining of CP for the past 3 to 4 wks   Activity is limited     On exam, pt is in some pain.  Just moved in bed   Notes some SOB  NeckJVP at 8 Lungs   Relatively clear Cardiac  RRR   No S3   No signif murmurs  Ext are without edema  Trop minimally elevated CXR with vasc congestions BNP elevated    I would reocmm diurese with IV lasix    Follow troponins    Echo tomorrow to eval LVEF  Add coreg for better BP control, HR control     Dorris Carnes MD For questions or updates, please contact   Please consult [email or web address] for contact info under Cardiology/STEMI.

## 2019-01-02 NOTE — H&P (Signed)
History and Physical:    [name]   EYC:[id number] DOB: 1940/12/23 DOA: 01/02/2019  Referring MD/provider: PA Ruthann Cancer PCP: Wenda Low, MD   Patient coming from: Home  Chief Complaint: mechanical fall 2 days ago and progressive shortness of breath for the past 3 weeks  History of Present Illness:   [name] is an 78 y.o. female with past medical history significant for hypertension, dementia and frequent falls who comes in today for a fall 2 days ago, hip pain. History is per patient's husband and son. Patient herself does not remember much of what happened in the past couple of weeks.   Patient was apparently doing well walking intermittently with her walker, intermittently without her walker with an exercise tolerance of about 30 feet until 3-4 weeks ago when she was noted to have developed some dyspnea on exertion. At that time patient's husband states that she was complaining intermittently of indigestion apparently both at rest and with exertion. For the past couple of weeks patient's exercise tolerance has decreased considerably and is now at about 10 feet with a walker. She apparently is much weaker than she had been a month ago.  2 days ago patient had a mechanical fall and hurt her hip. Patient is now brought in come planing of left hip pain and intermittent dyspnea on exertion.  ED Course:  The patient was noted to have a moderately displaced fracture of the left superior pubic ramus. Patient's dyspnea on exertion was worked up with a CT angiogram which was negative for pulmonary embolus but was noted to have some interstitial edema without overt fluid overload. BNP was minimally elevated at around 500 however her troponin was elevated at 0.21 patient was treated with aspirin. Cardiology and orthopedics were called.   ROS:   ROS   As per HPI, limited due to memory difficulties.    Past Medical History:   Past Medical History:  Diagnosis Date  .  Arthritis    "knees" (01/18/2018)  . Breast cancer, left breast ([place])   . Chronic bronchitis ([place])   . COPD (chronic obstructive pulmonary disease) ([place])   . GERD (gastroesophageal reflux disease)   . Headache    "just about qd" (01/18/2018)  . History of hiatal hernia   . History of stomach ulcers   . Hypertension   . Memory difficulty   . Migraine    "once/month maybe" (01/18/2018)  . Pneumonia    "maybe twice" (01/18/2018)  . Thyroid disease     Past Surgical History:   Past Surgical History:  Procedure Laterality Date  . ABDOMINAL HYSTERECTOMY     "partial"  . APPENDECTOMY    . BACK SURGERY    . BREAST LUMPECTOMY Left   . DILATION AND CURETTAGE OF UTERUS    . FEMUR FRACTURE SURGERY Left 22/3006   Archie Endo 04/01/2011  . I&D KNEE WITH POLY EXCHANGE Right 12/19/2002   Archie Endo 04/01/2011  . JOINT REPLACEMENT    . KNEE ARTHROSCOPY Left 09/2001   with popliteal cyst excision and synovectomy Archie Endo 04/01/2011  . LAPAROSCOPIC CHOLECYSTECTOMY  11/2004   Archie Endo 04/01/2011  . LUMBAR FUSION  05/2001   Archie Endo 04/01/2011  . ORIF FEMUR FRACTURE Right 01/19/2018   Procedure: OPEN REDUCTION INTERNAL FIXATION (ORIF) DISTAL FEMUR FRACTURE;  Surgeon: Hiram Gash, MD;  Location: [facility];  Service: Orthopedics;  Laterality: Right;  . TONSILLECTOMY    . TOTAL HIP ARTHROPLASTY Bilateral   . TOTAL HIP REVISION Left 04/2003   /  notes 04/01/2011  . TOTAL KNEE ARTHROPLASTY Bilateral 07/2000   Archie Endo 04/01/2011  . TOTAL KNEE REVISION Left 08/2003   Archie Endo 04/01/2011  . WRIST SURGERY Right    "? break"    Social History:   Social History   Socioeconomic History  . Marital status: Married    Spouse name: Not on file  . Number of children: Not on file  . Years of education: Not on file  . Highest education level: Not on file  Occupational History  . Not on file  Social Needs  . Financial resource strain: Not on file  . Food insecurity:    Worry: Not on file    Inability: Not on file  .  Transportation needs:    Medical: Not on file    Non-medical: Not on file  Tobacco Use  . Smoking status: Never Smoker  . Smokeless tobacco: Never Used  Substance and Sexual Activity  . Alcohol use: No  . Drug use: No  . Sexual activity: Not on file  Lifestyle  . Physical activity:    Days per week: Not on file    Minutes per session: Not on file  . Stress: Not on file  Relationships  . Social connections:    Talks on phone: Not on file    Gets together: Not on file    Attends religious service: Not on file    Active member of club or organization: Not on file    Attends meetings of clubs or organizations: Not on file    Relationship status: Not on file  . Intimate partner violence:    Fear of current or ex partner: Not on file    Emotionally abused: Not on file    Physically abused: Not on file    Forced sexual activity: Not on file  Other Topics Concern  . Not on file  Social History Narrative  . Not on file    Allergies   Codeine  Family history:   Family History  Problem Relation Age of Onset  . CAD Father        "heart attack" in early 34's.  . Atrial fibrillation Son     Current Medications:   Prior to Admission medications   Medication Sig Start Date End Date Taking? Authorizing Provider  benazepril (LOTENSIN) 40 MG tablet Take 40 mg by mouth daily. 12/28/18  Yes [provider]  mirtazapine (REMERON) 15 MG tablet Take 15 mg by mouth at bedtime. 10/02/18  Yes [provider]  celecoxib (CELEBREX) 100 MG capsule Take 1 capsule (100 mg total) by mouth daily. Patient not taking: Reported on 01/02/2019 01/20/18 01/20/19  Hiram Gash, MD  docusate sodium (COLACE) 100 MG capsule Take 1 capsule (100 mg total) by mouth 2 (two) times daily. Patient not taking: Reported on 01/02/2019 01/22/18   Modena Jansky, MD  enoxaparin (LOVENOX) 40 MG/0.4ML injection Inject 0.4 mLs (40 mg total) into the skin daily. Patient not taking: Reported on 01/02/2019  01/20/18 01/20/19  Hiram Gash, MD  feeding supplement, ENSURE ENLIVE, (ENSURE ENLIVE) LIQD Take 237 mLs by mouth 3 (three) times daily between meals. 01/22/18   Hongalgi, Lenis Dickinson, MD  Multiple Vitamin (MULTIVITAMIN WITH MINERALS) TABS tablet Take 1 tablet by mouth daily. Patient not taking: Reported on 01/02/2019 01/22/18   Modena Jansky, MD  senna-docusate (SENOKOT-S) 8.6-50 MG tablet Take 1 tablet by mouth at bedtime as needed for mild constipation. Patient not taking: Reported on 01/02/2019 01/22/18  Hongalgi, Lenis Dickinson, MD  traMADol (ULTRAM) 50 MG tablet Take 1 tablet (50 mg total) by mouth every 6 (six) hours as needed. Patient not taking: Reported on 01/02/2019 01/20/18 01/20/19  Hiram Gash, MD    Physical Exam:   Vitals:   01/02/19 1815 01/02/19 1830 01/02/19 1845 01/02/19 1900  BP: (!) 181/105 (!) 178/93 (!) 187/85 (!) 193/142  Pulse: (!) 103 93 93 (!) 101  Resp: (!) 23 15 17  (!) 22  Temp:      TempSrc:      SpO2: 95% 98% 94% 97%     Physical Exam: Blood pressure (!) 193/142, pulse (!) 101, temperature 98.2 F (36.8 C), temperature source Oral, resp. rate (!) 22, SpO2 97 %. Gen: comfortable appearing elderly female lying in bed with attentive family at bedside in no acute distress Eyes: Sclerae anicteric. Conjunctiva mildly injected. Neck: Supple, no jugular venous distention. Chest: Moderately good air entry bilaterally with no adventitious sounds.  CV: Distant, regular, no audible murmurs. Abdomen: NABS, soft, nondistended, nontender. No tenderness to light or deep palpation. No rebound, no guarding. Extremities: holding left leg still. Skin: Warm and dry. No rashes, lesions or wounds.   Data Review:    Labs: Basic Metabolic Panel: Recent Labs  Lab 01/02/19 1400  NA 137  K 4.0  CL 100  CO2 23  GLUCOSE 116*  BUN 14  CREATININE 1.42*  CALCIUM 9.0   Liver Function Tests: No results for input(s): AST, ALT, ALKPHOS, BILITOT, PROT, ALBUMIN in the last 168 hours. No  results for input(s): LIPASE, AMYLASE in the last 168 hours. No results for input(s): AMMONIA in the last 168 hours. CBC: Recent Labs  Lab 01/02/19 1400  WBC 11.6*  NEUTROABS 9.7*  HGB 13.8  HCT 43.6  MCV 98.0  PLT 249   Cardiac Enzymes: Recent Labs  Lab 01/02/19 1400  TROPONINI 0.21*    BNP (last 3 results) No results for input(s): PROBNP in the last 8760 hours. CBG: No results for input(s): GLUCAP in the last 168 hours.  Urinalysis    Component Value Date/Time   COLORURINE YELLOW 01/02/2019 1815   APPEARANCEUR CLEAR 01/02/2019 1815   LABSPEC >1.046 (H) 01/02/2019 1815   PHURINE 5.0 01/02/2019 1815   GLUCOSEU NEGATIVE 01/02/2019 1815   HGBUR SMALL (A) 01/02/2019 1815   BILIRUBINUR NEGATIVE 01/02/2019 1815   KETONESUR 20 (A) 01/02/2019 1815   PROTEINUR NEGATIVE 01/02/2019 1815   NITRITE NEGATIVE 01/02/2019 1815   LEUKOCYTESUR NEGATIVE 01/02/2019 1815      Radiographic Studies: Dg Chest 1 View  Result Date: 01/02/2019 CLINICAL DATA:  Shortness of breath, fall. EXAM: CHEST - 2 VIEW COMPARISON:  Radiograph of January 18, 2018. FINDINGS: Stable cardiomegaly. Large hiatal hernia is noted. No pneumothorax or pleural effusion is noted. No acute pulmonary disease is noted. Bony thorax is unremarkable. IMPRESSION: No active cardiopulmonary disease.  Large hiatal hernia. Electronically Signed   By: Marijo Conception, M.D.   On: 01/02/2019 15:47   Dg Lumbar Spine Complete  Result Date: 01/02/2019 CLINICAL DATA:  Low back pain after fall. EXAM: LUMBAR SPINE - COMPLETE 4+ VIEW COMPARISON:  Radiographs of October 06, 2005. FINDINGS: Status post surgical posterior fusion of L3-4 and L4-5 with bilateral intrapedicular screw placement. Stable grade 1 anterolisthesis of L4-5 is noted. Moderate degenerative disc disease is noted at L1-2 and L5-S1. No acute fracture is noted. Diffuse osteopenia is noted. IMPRESSION: Postsurgical and degenerative changes as described above. No acute  abnormality seen in  the lumbar spine. Electronically Signed   By: Marijo Conception, M.D.   On: 01/02/2019 15:49   Dg Sacrum/coccyx  Result Date: 01/02/2019 CLINICAL DATA:  Low back pain after fall. EXAM: SACRUM AND COCCYX - 2+ VIEW COMPARISON:  None. FINDINGS: Mildly displaced fracture is seen involving the left superior pubic ramus. Status post bilateral hip prostheses. The sacrum is unremarkable. Sacroiliac joints appear normal. IMPRESSION: Mildly displaced fracture involving the left superior pubic ramus. Sacrum and coccyx are unremarkable. Electronically Signed   By: Marijo Conception, M.D.   On: 01/02/2019 15:51   Ct Head Wo Contrast  Result Date: 01/02/2019 CLINICAL DATA:  Dizziness, fall. EXAM: CT HEAD WITHOUT CONTRAST TECHNIQUE: Contiguous axial images were obtained from the base of the skull through the vertex without intravenous contrast. COMPARISON:  CT scan of January 18, 2018. FINDINGS: Brain: Mild diffuse cortical atrophy is noted. Mild chronic ischemic white matter disease is noted. Old lacunar infarction is noted in right basal ganglia. No mass effect or midline shift is noted. Ventricular size is within normal limits. There is no evidence of mass lesion, hemorrhage or acute infarction. Vascular: No hyperdense vessel or unexpected calcification. Skull: Normal. Negative for fracture or focal lesion. Sinuses/Orbits: No acute finding. Other: None. IMPRESSION: Mild diffuse cortical atrophy. Mild chronic ischemic white matter disease. No acute intracranial abnormality seen. Electronically Signed   By: Marijo Conception, M.D.   On: 01/02/2019 16:33   Ct Angio Chest Pe W/cm &/or Wo Cm  Result Date: 01/02/2019 CLINICAL DATA:  Fall 2 days ago.  Now with back pain. EXAM: CT ANGIOGRAPHY CHEST WITH CONTRAST TECHNIQUE: Multidetector CT imaging of the chest was performed using the standard protocol during bolus administration of intravenous contrast. Multiplanar CT image reconstructions and MIPs were obtained  to evaluate the vascular anatomy. CONTRAST:  20mL ISOVUE-370 IOPAMIDOL (ISOVUE-370) INJECTION 76% COMPARISON:  Current chest radiographs.  Chest CT, 03/03/2018. FINDINGS: Cardiovascular: There is satisfactory opacification of the pulmonary arteries to the segmental level. There is no evidence of a pulmonary embolism. Heart is mildly enlarged. Trace pericardial effusion. Mild left coronary artery calcifications. Great vessels normal in caliber. There is aortic atherosclerosis, but no dissection aortic arch branch vessels demonstrate mild atherosclerosis, without significant stenosis. Left vertebral artery has its origin from aortic arch. Mediastinum/Nodes: Large hernia includes the entire stomach as well as portions of the colon. No bowel incarceration or strangulation. Mild distention of the esophagus above the herniated stomach. No esophageal wall thickening or mass. Trachea is unremarkable. No neck base, axillary, mediastinal or hilar masses. No pathologically enlarged lymph nodes. Lungs/Pleura: Bilateral interstitial thickening. Mosaic attenuation is noted bilaterally. There is confluent opacity in the lower lobes adjacent to the large hernia, consistent with atelectasis. No pleural effusion.  No pneumothorax. Upper Abdomen: No acute finding. Musculoskeletal: No acute fracture. No osteoblastic or osteolytic lesions. Review of the MIP images confirms the above findings. IMPRESSION: 1. No evidence of a pulmonary embolism. 2. Compressive atelectasis of the medial lower lobes adjacent to a large hernia, centered at the esophageal hiatus. Hernia contains the stomach and portions of the colon. 3. No convincing pneumonia. Lungs show mosaic attenuation and mild interstitial thickening, new since the prior CT. Possible mild interstitial edema. Mosaic attenuation is more likely due to small airways disease. 4. No acute fracture. 5. Coronary artery calcifications.  Aortic atherosclerosis. Aortic Atherosclerosis  (ICD10-I70.0). Electronically Signed   By: Lajean Manes M.D.   On: 01/02/2019 16:34   Dg Hip Unilat W Or  Wo Pelvis 2-3 Views Left  Result Date: 01/02/2019 CLINICAL DATA:  Left hip pain after fall. EXAM: DG HIP (WITH OR WITHOUT PELVIS) 2-3V LEFT COMPARISON:  None. FINDINGS: Moderately displaced fracture is seen involving the left superior pubic ramus near the acetabulum. Status post bilateral hip arthroplasties. IMPRESSION: Moderately displaced fracture seen involving the left superior pubic ramus. Electronically Signed   By: Marijo Conception, M.D.   On: 01/02/2019 15:52    EKG: Independently reviewed. NSR at 90. Normal intervals. LAD. 0.5-75mm ST depressions I, L, V5-V6. Isolated Q in V1.  Assessment/Plan:   Active Problems:   NSTEMI (non-ST elevated myocardial infarction) ([place])  HIP FRACTURE Awaiting evaluation by orthopedics Pain is reasonably well controlled. Prn dilaudid very low dose 0.5 mg q 3 prn ordered  Patient is on iv heparin which will cover DVT prophylaxis   ELEVATED TROPONIN Given history of intermittent chest discomfort and progressive DOE over the past 3 weeks, it is unclear to me if patient possibly had an MI in the past couple of weeks and we're seeing the troponin on its way down or  whether she's had a more acute myocardial infarction.  I do not see any Q waves although she does have some mild ST depressions laterally so I am concerned that she is having having an infarction more acutely rather than subacutely. Has received aspirin 325 mg by mouth 1 today Will start IV heparin. Will start Lopressor 25 mg by mouth twice a day given marked elevation of blood pressure. Continue benazepril Trend troponin q6x3, echocardiogram in the morning. Cardiology following.   ELEVATED BNP Patient does not appear to me overtly fluid overloaded. She is already on an ACE inhibitor I've initiated a beta blocker She received 1 dose of Lasix in the ED and cardiology has ordered  another dose for tonight. Echocardiogram has been ordered and is pending.  RENAL DYSFUNCTION Patient with acute renal failure with creatinine increased from 0.9 in March to 1.4 today. I'm reluctant to give IV fluids since patient received Lasix in ED and another dose was ordered by cardiology for possible pulmonary edema.  However I suspect she may have some level of intravascular volume depletion. At this point will treat conservatively, avoid renal toxic medications and recheck in the morning. Would consider gentle IV hydration if creatinine worsens.  HTN Blood pressure markedly elevated at present. Will start Lopressor 25 mg by mouth twice a day and follow Continue benazepril  DEMENTIA She does not appear to be on any dementia medications  PSYCH Continue Remeron    Other information:   DVT prophylaxis: on heparin for NSTEMI Code Status: Full code. Family Communication: son and husband were at bedside throughout Disposition Plan: to be decided Consults called: cardiology, orthopedics Admission status: inpatient   The medical decision making is of moderate complexity, therefore this is a level 2 visit.  Dewaine Oats Tublu Jadeyn Hargett [facility]  If 7PM-7AM, please contact night-coverage [email or web address] Password [facility] 01/02/2019, 7:03 PM

## 2019-01-02 NOTE — ED Notes (Signed)
Due to PA REQUEST placed pt on purewick

## 2019-01-02 NOTE — ED Notes (Signed)
Patient transported to X-ray 

## 2019-01-02 NOTE — ED Triage Notes (Addendum)
Pt reports she had a fall Saturday. Denies dizziness prior to fall. Pt is very poor historian. Now reports lumbar back pain.

## 2019-01-02 NOTE — ED Notes (Addendum)
Pt states she needs to pee but is unable to do so. Co pain from same. Will inform provider/ Oncoming nurse

## 2019-01-02 NOTE — ED Notes (Signed)
Provider aware of troponin.  

## 2019-01-02 NOTE — ED Provider Notes (Addendum)
78 year old female with hx of COPD, HTN, mild dementia, B12 deficiency had a fall 2 days ago. She hasn't been able to bear weight since then. She is unsure of why she fell but was found by her husband and did not have a prolonged down time. She's been more short of breath over the past 2 days and requiring O2 here. No cardiac hx. She is hypertensive. BNP and Troponin are elevated. She has an AKI as well. CTA of chest ordered to r/o PE. Xray shows left superior pubic ramus fracture.  CT does not show PE but does show interstitial edema. Discussed with cardiology who will consult. PT does not have chest pain and think this is less likely ACS. Will give 40mg  IV Lasix and ASA. Will consult hospitalist.  Discussed with Cardiology who will consult. who Spoke with Dr. Jamse Arn who will admit.   7:30 PM Discussed with Victorino December with ortho who will come to see. He states pt can be weight bearing as tolerated.     Recardo Evangelist, PA-C 01/02/19 1930    Drenda Freeze, MD 01/02/19 [phone]

## 2019-01-02 NOTE — ED Notes (Signed)
Pt states she needs to pee but is unable reequest cath. Provider aware,

## 2019-01-02 NOTE — ED Notes (Signed)
SPO2 improved to 93% on 4L of oxygen.

## 2019-01-02 NOTE — ED Provider Notes (Signed)
[facility] EMERGENCY DEPARTMENT Provider Note   CSN: [id number] Arrival date & time: 01/02/19  1024     History   Chief Complaint Chief Complaint  Patient presents with  . Fall    HPI [name] is a 78 y.o. female with a past medical history of hypertension, COPD, status post lumbar fusion in 2012, who presents to ED for fall that occurred on 12/24/2018.  Patient is unsure why she fell.  Husband at bedside states that he heard patient falling from the other room.  When he went to see her, she was alert and needed his assistance to get up.  She usually ambulates with a walker.  She has had pain with ambulation since the fall.  Pain is also worse with palpation and any movement and improves when she is laying down or sitting still.  Pain is located near her tailbone.  She has had significant dyspnea on exertion since the fall.  She has had dizziness for the past several months.  Increase in dizziness since the fall.  She denies any head injury or loss of consciousness associated with the fall.  She has never had to wear supplemental oxygen in the past.  She received radiation for her breast cancer about 20 years ago.  HPI  Past Medical History:  Diagnosis Date  . Arthritis    "knees" (01/18/2018)  . Breast cancer, left breast ([place])   . Chronic bronchitis ([place])   . COPD (chronic obstructive pulmonary disease) ([place])   . GERD (gastroesophageal reflux disease)   . Headache    "just about qd" (01/18/2018)  . History of hiatal hernia   . History of stomach ulcers   . Hypertension   . Memory difficulty   . Migraine    "once/month maybe" (01/18/2018)  . Pneumonia    "maybe twice" (01/18/2018)  . Thyroid disease     Patient Active Problem List   Diagnosis Date Noted  . Femur fracture, right ([place]) 01/18/2018  . B12 deficiency 01/18/2018  . Hypertension   . Thyroid disease   . Dementia ([place])   . Memory difficulty     Past Surgical History:  Procedure Laterality  Date  . ABDOMINAL HYSTERECTOMY     "partial"  . APPENDECTOMY    . BACK SURGERY    . BREAST LUMPECTOMY Left   . DILATION AND CURETTAGE OF UTERUS    . FEMUR FRACTURE SURGERY Left 22/3006   Archie Endo 04/01/2011  . I&D KNEE WITH POLY EXCHANGE Right 12/19/2002   Archie Endo 04/01/2011  . JOINT REPLACEMENT    . KNEE ARTHROSCOPY Left 09/2001   with popliteal cyst excision and synovectomy Archie Endo 04/01/2011  . LAPAROSCOPIC CHOLECYSTECTOMY  11/2004   Archie Endo 04/01/2011  . LUMBAR FUSION  05/2001   Archie Endo 04/01/2011  . ORIF FEMUR FRACTURE Right 01/19/2018   Procedure: OPEN REDUCTION INTERNAL FIXATION (ORIF) DISTAL FEMUR FRACTURE;  Surgeon: Hiram Gash, MD;  Location: [facility];  Service: Orthopedics;  Laterality: Right;  . TONSILLECTOMY    . TOTAL HIP ARTHROPLASTY Bilateral   . TOTAL HIP REVISION Left 04/2003   Archie Endo 04/01/2011  . TOTAL KNEE ARTHROPLASTY Bilateral 07/2000   Archie Endo 04/01/2011  . TOTAL KNEE REVISION Left 08/2003   Archie Endo 04/01/2011  . WRIST SURGERY Right    "? break"     OB History   No obstetric history on file.      Home Medications    Prior to Admission medications   Medication Sig Start  Date End Date Taking? Authorizing Provider  albuterol (PROVENTIL HFA;VENTOLIN HFA) 108 (90 BASE) MCG/ACT inhaler Inhale 2 puffs into the lungs every 6 (six) hours as needed for wheezing or shortness of breath.    [provider]  benazepril (LOTENSIN) 20 MG tablet Take 20 mg by mouth daily. 01/03/18   [provider]  celecoxib (CELEBREX) 100 MG capsule Take 1 capsule (100 mg total) by mouth daily. 01/20/18 01/20/19  Hiram Gash, MD  Cyanocobalamin (B-12 COMPLIANCE INJECTION) 1000 MCG/ML KIT Inject 1 mL as directed once a week.    [provider]  docusate sodium (COLACE) 100 MG capsule Take 1 capsule (100 mg total) by mouth 2 (two) times daily. 01/22/18   Hongalgi, Lenis Dickinson, MD  enoxaparin (LOVENOX) 40 MG/0.4ML injection Inject 0.4 mLs (40 mg total) into the skin daily. 01/20/18  01/20/19  Hiram Gash, MD  feeding supplement, ENSURE ENLIVE, (ENSURE ENLIVE) LIQD Take 237 mLs by mouth 3 (three) times daily between meals. 01/22/18   Hongalgi, Lenis Dickinson, MD  Multiple Vitamin (MULTIVITAMIN WITH MINERALS) TABS tablet Take 1 tablet by mouth daily. 01/22/18   Hongalgi, Lenis Dickinson, MD  senna-docusate (SENOKOT-S) 8.6-50 MG tablet Take 1 tablet by mouth at bedtime as needed for mild constipation. 01/22/18   Hongalgi, Lenis Dickinson, MD  traMADol (ULTRAM) 50 MG tablet Take 1 tablet (50 mg total) by mouth every 6 (six) hours as needed. 01/20/18 01/20/19  Hiram Gash, MD    Family History History reviewed. No pertinent family history.  Social History Social History   Tobacco Use  . Smoking status: Never Smoker  . Smokeless tobacco: Never Used  Substance Use Topics  . Alcohol use: No  . Drug use: No     Allergies   Codeine   Review of Systems Review of Systems  Constitutional: Negative for appetite change, chills and fever.  HENT: Negative for ear pain, rhinorrhea, sneezing and sore throat.   Eyes: Negative for photophobia and visual disturbance.  Respiratory: Positive for shortness of breath. Negative for cough, chest tightness and wheezing.   Cardiovascular: Negative for chest pain and palpitations.  Gastrointestinal: Negative for abdominal pain, blood in stool, constipation, diarrhea, nausea and vomiting.  Genitourinary: Negative for dysuria, hematuria and urgency.  Musculoskeletal: Positive for back pain. Negative for myalgias.  Skin: Negative for rash.  Neurological: Positive for dizziness and light-headedness. Negative for weakness.     Physical Exam Updated Vital Signs BP (!) 164/96   Pulse 90   Temp 98.2 F (36.8 C) (Oral)   Resp 16   SpO2 96%   Physical Exam Vitals signs and nursing note reviewed.  Constitutional:      General: She is not in acute distress.    Appearance: She is well-developed.  HENT:     Head: Normocephalic and atraumatic.     Nose: Nose  normal.  Eyes:     General: No scleral icterus.       Left eye: No discharge.     Conjunctiva/sclera: Conjunctivae normal.  Neck:     Musculoskeletal: Normal range of motion and neck supple.  Cardiovascular:     Rate and Rhythm: Normal rate and regular rhythm.     Heart sounds: Normal heart sounds. No murmur. No friction rub. No gallop.   Pulmonary:     Effort: Pulmonary effort is normal. No respiratory distress.     Breath sounds: Normal breath sounds.  Abdominal:     General: Bowel sounds are normal. There is  no distension.     Palpations: Abdomen is soft.     Tenderness: There is no abdominal tenderness. There is no guarding.  Musculoskeletal: Normal range of motion.       Back:     Comments: Tenderness to palpation near the sacrum/lumbar spine at midline. No step-off palpated. No visible bruising, edema or temperature change noted. No objective signs of numbness present. No saddle anesthesia. 2+ DP pulses bilaterally. Sensation intact to light touch. Strength 5/5 in bilateral lower extremities.  Skin:    General: Skin is warm and dry.     Findings: No rash.  Neurological:     Mental Status: She is alert.     Motor: No abnormal muscle tone.     Coordination: Coordination normal.      ED Treatments / Results  Labs (all labs ordered are listed, but only abnormal results are displayed) Labs Reviewed  BASIC METABOLIC PANEL - Abnormal; Notable for the following components:      Result Value   Glucose, Bld 116 (*)    Creatinine, Ser 1.42 (*)    GFR calc non Af Amer 35 (*)    GFR calc Af Amer 41 (*)    All other components within normal limits  CBC WITH DIFFERENTIAL/PLATELET - Abnormal; Notable for the following components:   WBC 11.6 (*)    Neutro Abs 9.7 (*)    All other components within normal limits  BRAIN NATRIURETIC PEPTIDE - Abnormal; Notable for the following components:   B Natriuretic Peptide 518.5 (*)    All other components within normal limits  TROPONIN I -  Abnormal; Notable for the following components:   Troponin I 0.21 (*)    All other components within normal limits  I-STAT TROPONIN, ED - Abnormal; Notable for the following components:   Troponin i, poc 0.19 (*)    All other components within normal limits  URINALYSIS, ROUTINE W REFLEX MICROSCOPIC    EKG EKG Interpretation  Date/Time:  Monday January 02 2019 14:08:09 EST Ventricular Rate:  91 PR Interval:    QRS Duration: 94 QT Interval:  400 QTC Calculation: 493 R Axis:     Text Interpretation:  Sinus rhythm Probable LVH with secondary repol abnrm Borderline prolonged QT interval Subtle ST downsloping in lateral precordial leads No overt ST elevations Confirmed by Duffy Bruce [phone]) on 01/02/2019 2:19:18 PM   Radiology No results found.  Procedures Procedures (including critical care time)  CRITICAL CARE Performed by: Delia Heady   Total critical care time: 35 minutes  Critical care time was exclusive of separately billable procedures and treating other patients.  Critical care was necessary to treat or prevent imminent or life-threatening deterioration.  Critical care was time spent personally by me on the following activities: development of treatment plan with patient and/or surrogate as well as nursing, discussions with consultants, evaluation of patient's response to treatment, examination of patient, obtaining history from patient or surrogate, ordering and performing treatments and interventions, ordering and review of laboratory studies, ordering and review of radiographic studies, pulse oximetry and re-evaluation of patient's condition.   Medications Ordered in ED Medications  iopamidol (ISOVUE-370) 76 % injection (has no administration in time range)  iopamidol (ISOVUE-370) 76 % injection (has no administration in time range)  fentaNYL (SUBLIMAZE) injection 25 mcg (25 mcg Intravenous Given 01/02/19 1435)  ipratropium-albuterol (DUONEB) 0.5-2.5 (3) MG/3ML  nebulizer solution 3 mL (3 mLs Nebulization Given 01/02/19 1438)  iopamidol (ISOVUE-370) 76 % injection 50 mL (50 mLs  Intravenous Contrast Given 01/02/19 1555)     Initial Impression / Assessment and Plan / ED Course  I have reviewed the triage vital signs and the nursing notes.  Pertinent labs & imaging results that were available during my care of the patient were reviewed by me and considered in my medical decision making (see chart for details).     78 year old female presents to ED for fall.  Patient told me that she fell last Saturday.  She told Dr. Ellender Hose that she fell 2 days ago.  She told him that she had a head injury at the time.  She has been short of breath, dizziness since then.  She is complaining of lower back and tailbone pain.  History of spinal fusion in the past.  Denies chest pain.  Diminished breath sounds globally noted.  No wheezing noted.  No lower extremity edema, erythema or calf tenderness bilaterally.  No deficits to neurological exam noted.  Patient given DuoNeb, fentanyl.  Troponin elevated here and EKG shows subtle ST downsloping which is new from previous but no ST elevation.  She continues to deny chest pain.  Will obtain imaging of back, chest, CTA to rule out PE, BNP for new onset CHF and reassess after medications.  I anticipate admission for elevated troponin at the very least and new oxygen requirement.  She remains comfortable at 2 L.     Portions of this note were generated with [employer]. Dictation errors may occur despite best attempts at proofreading.   Final Clinical Impressions(s) / ED Diagnoses   Final diagnoses:  Fall in home, initial encounter  Injury of head, initial encounter  Elevated troponin    ED Discharge Orders    None       Delia Heady, PA-C 01/02/19 1602    Duffy Bruce, MD 01/04/19 [phone]

## 2019-01-03 ENCOUNTER — Other Ambulatory Visit: Payer: Self-pay

## 2019-01-03 ENCOUNTER — Inpatient Hospital Stay (HOSPITAL_COMMUNITY): Payer: Medicare Other

## 2019-01-03 DIAGNOSIS — R079 Chest pain, unspecified: Secondary | ICD-10-CM

## 2019-01-03 DIAGNOSIS — R0600 Dyspnea, unspecified: Secondary | ICD-10-CM

## 2019-01-03 LAB — BASIC METABOLIC PANEL
ANION GAP: 11 (ref 5–15)
BUN: 16 mg/dL (ref 8–23)
CHLORIDE: 99 mmol/L (ref 98–111)
CO2: 27 mmol/L (ref 22–32)
Calcium: 8.6 mg/dL — ABNORMAL LOW (ref 8.9–10.3)
Creatinine, Ser: 0.97 mg/dL (ref 0.44–1.00)
GFR calc Af Amer: >60 mL/min (ref >60)
GFR calc non Af Amer: 56 mL/min — ABNORMAL LOW (ref >60)
Glucose, Bld: 124 mg/dL — ABNORMAL HIGH (ref 70–99)
Potassium: 2.9 mmol/L — ABNORMAL LOW (ref 3.5–5.1)
Sodium: 137 mmol/L (ref 135–145)

## 2019-01-03 LAB — HEPARIN LEVEL (UNFRACTIONATED)
Heparin Unfractionated: 0.19 IU/mL — ABNORMAL LOW (ref 0.30–0.70)
Heparin Unfractionated: 0.37 IU/mL (ref 0.30–0.70)

## 2019-01-03 LAB — ECHOCARDIOGRAM COMPLETE
HEIGHTINCHES: 64.75 in
Weight: 2134.05 oz

## 2019-01-03 LAB — CBC
HEMATOCRIT: 38.8 % (ref 36.0–46.0)
Hemoglobin: 12.6 g/dL (ref 12.0–15.0)
MCH: 31.3 pg (ref 26.0–34.0)
MCHC: 32.5 g/dL (ref 30.0–36.0)
MCV: 96.5 fL (ref 80.0–100.0)
Platelets: 197 10*3/uL (ref 150–400)
RBC: 4.02 MIL/uL (ref 3.87–5.11)
RDW: 13.2 % (ref 11.5–15.5)
WBC: 8.4 10*3/uL (ref 4.0–10.5)
nRBC: 0 % (ref 0.0–0.2)

## 2019-01-03 LAB — TROPONIN I: TROPONIN I: 0.11 ng/mL — AB (ref <0.03)

## 2019-01-03 LAB — BRAIN NATRIURETIC PEPTIDE: B Natriuretic Peptide: 209.8 pg/mL — ABNORMAL HIGH (ref 0.0–100.0)

## 2019-01-03 LAB — HEMOGLOBIN A1C
Hgb A1c MFr Bld: 5.3 % (ref 4.8–5.6)
Mean Plasma Glucose: 105 mg/dL

## 2019-01-03 MED ORDER — ALBUTEROL SULFATE (2.5 MG/3ML) 0.083% IN NEBU
2.5000 mg | INHALATION_SOLUTION | RESPIRATORY_TRACT | Status: DC | PRN
  Administered 2019-01-03: 2.5 mg via RESPIRATORY_TRACT
  Filled 2019-01-03: qty 3

## 2019-01-03 MED ORDER — POTASSIUM CHLORIDE CRYS ER 20 MEQ PO TBCR
40.0000 meq | EXTENDED_RELEASE_TABLET | Freq: Once | ORAL | Status: AC
  Administered 2019-01-03: 40 meq via ORAL
  Filled 2019-01-03: qty 2

## 2019-01-03 MED ORDER — ENOXAPARIN SODIUM 40 MG/0.4ML ~~LOC~~ SOLN
40.0000 mg | SUBCUTANEOUS | Status: DC
  Administered 2019-01-03 – 2019-01-05 (×3): 40 mg via SUBCUTANEOUS
  Filled 2019-01-03 (×3): qty 0.4

## 2019-01-03 MED ORDER — FENTANYL CITRATE (PF) 100 MCG/2ML IJ SOLN
12.5000 ug | INTRAMUSCULAR | Status: DC | PRN
  Administered 2019-01-03 – 2019-01-04 (×3): 12.5 ug via INTRAVENOUS
  Filled 2019-01-03 (×3): qty 2

## 2019-01-03 MED ORDER — KETOROLAC TROMETHAMINE 10 MG PO TABS
10.0000 mg | ORAL_TABLET | Freq: Three times a day (TID) | ORAL | Status: DC | PRN
  Administered 2019-01-03 – 2019-01-05 (×4): 10 mg via ORAL
  Filled 2019-01-03 (×5): qty 1

## 2019-01-03 NOTE — Progress Notes (Signed)
ANTICOAGULATION CONSULT NOTE - Follow-up Consult  Pharmacy Consult for heparin Indication: chest pain/ACS  Allergies  Allergen Reactions  . Codeine Anaphylaxis    Patient Measurements: Height: 5' 4.75" (164.5 cm) Weight: 133 lb 6.1 oz (60.5 kg) IBW/kg (Calculated) : 56.43 Heparin Dosing Weight: 61kg  Vital Signs: Temp: 98.5 F (36.9 C) (02/18 0827) Temp Source: Oral (02/18 0827) BP: 136/88 (02/18 0827) Pulse Rate: 81 (02/18 0827)  Labs: Recent Labs    01/02/19 1400 01/02/19 1924 01/03/19 0256 01/03/19 0859 01/03/19 1237  HGB 13.8  --  12.6  --   --   HCT 43.6  --  38.8  --   --   PLT 249  --  197  --   --   HEPARINUNFRC  --   --  0.19*  --  0.37  CREATININE 1.42*  --   --  0.97  --   TROPONINI 0.21* 0.13* 0.11*  --   --     Estimated Creatinine Clearance: 41.9 mL/min (by C-G formula based on SCr of 0.97 mg/dL).  Assessment: 7 YOF with EKG changes, elevated troponin, also with acute pelvic fracture, no anticoagulation PTA, CBC wnl. To start heparin per pharmacy.  Heparin level now therapeutic at 0.37. Per ortho, hip fracture will be managed nonoperatively.  Goal of Therapy:  Heparin level 0.3-0.7 units/ml Monitor platelets by anticoagulation protocol: Yes   Plan:  Continue heparin gtt at 950 units/hr Check 8 hour confirmatory heparin level Monitor daily heparin level, CBC, s/sx of bleeding  Jackson Latino, PharmD PGY1 Pharmacy Resident Phone [phone] 01/03/2019     1:40 PM

## 2019-01-03 NOTE — Progress Notes (Signed)
  Echocardiogram 2D Echocardiogram has been performed.  [name] 01/03/2019, 10:24 AM

## 2019-01-03 NOTE — Progress Notes (Signed)
ANTICOAGULATION CONSULT NOTE - Follow-up Consult  Pharmacy Consult for heparin Indication: chest pain/ACS  Allergies  Allergen Reactions  . Codeine Anaphylaxis    Patient Measurements: Height: 5' 4.75" (164.5 cm) Weight: 133 lb 12.8 oz (60.7 kg) IBW/kg (Calculated) : 56.43 Heparin Dosing Weight: 61kg  Vital Signs: Temp: 98.6 F (37 C) (02/18 0000) Temp Source: Oral (02/18 0000) BP: 103/73 (02/18 0000) Pulse Rate: 81 (02/18 0000)  Labs: Recent Labs    01/02/19 1400 01/02/19 1924 01/03/19 0256  HGB 13.8  --  12.6  HCT 43.6  --  38.8  PLT 249  --  197  HEPARINUNFRC  --   --  0.19*  CREATININE 1.42*  --   --   TROPONINI 0.21* 0.13*  --     Estimated Creatinine Clearance: 28.6 mL/min (A) (by C-G formula based on SCr of 1.42 mg/dL (H)).  Assessment: 68 YOF with EKG changes, elevated troponin, also with acute pelvic fracture, no anticoagulation PTA, CBC wnl. To start heparin per pharmacy.  Heparin level subtherapeutic (0.19) on gtt at 750 units/hr. CBC ok. No issues with line or bleeding reported per RN.  Goal of Therapy:  Heparin level 0.3-0.7 units/ml Monitor platelets by anticoagulation protocol: Yes   Plan:  Increase  heparin gtt to 950 units/hr Will f/u 8hr heparin level  Sherlon Handing, PharmD, BCPS Clinical pharmacist  **Pharmacist phone directory can now be found on [email or web address] ([email or web address]).  Listed under [place]. 01/03/2019 3:46 AM

## 2019-01-03 NOTE — Progress Notes (Signed)
Progress Note  Patient Name: [name] Date of Encounter: 01/03/2019  Primary Cardiologist: Reola Calkins   Subjective   Patient is a little SOBwith sl chest pressure   Inpatient Medications    Scheduled Meds: . aspirin EC  81 mg Oral Daily  . benazepril  40 mg Oral Daily  . feeding supplement (ENSURE ENLIVE)  237 mL Oral TID BM  . metoprolol tartrate  25 mg Oral BID  . mirtazapine  15 mg Oral QHS   Continuous Infusions: . heparin 950 Units/hr (01/03/19 0349)   PRN Meds: acetaminophen, HYDROmorphone (DILAUDID) injection, nitroGLYCERIN, ondansetron (ZOFRAN) IV   Vital Signs    Vitals:   01/02/19 2216 01/03/19 0000 01/03/19 0349 01/03/19 0603  BP:  103/73 115/66   Pulse:  81 73   Resp:      Temp:  98.6 F (37 C)  98.3 F (36.8 C)  TempSrc:  Oral    SpO2:  95% 97%   Weight: 60.7 kg   60.5 kg  Height: 5' 4.75" (1.645 m)       Intake/Output Summary (Last 24 hours) at 01/03/2019 0831 Last data filed at 01/03/2019 0500 Gross per 24 hour  Intake 600 ml  Output 1200 ml  Net -600 ml   Last 3 Weights 01/03/2019 01/02/2019 01/02/2019  Weight (lbs) 133 lb 6.1 oz 133 lb 12.8 oz 135 lb  Weight (kg) 60.5 kg 60.691 kg 61.236 kg      Telemetry    SR - Personally Reviewed  ECG     Physical Exam   CWC:BJSE 78 yo in  No acute distress.   Neck: No JVD Cardiac: RRR, no murmurs, rubs, or gallops.  Respiratory: Clear to auscultation bilaterally. GI: Soft, nontender, non-distended  MS: No edema; No deformity. Neuro:  Nonfocal  Psych: Normal affect   Labs    Chemistry Recent Labs  Lab 01/02/19 1400  NA 137  K 4.0  CL 100  CO2 23  GLUCOSE 116*  BUN 14  CREATININE 1.42*  CALCIUM 9.0  GFRNONAA 35*  GFRAA 41*  ANIONGAP 14     Hematology Recent Labs  Lab 01/02/19 1400 01/03/19 0256  WBC 11.6* 8.4  RBC 4.45 4.02  HGB 13.8 12.6  HCT 43.6 38.8  MCV 98.0 96.5  MCH 31.0 31.3  MCHC 31.7 32.5  RDW 13.4 13.2  PLT 249 197    Cardiac Enzymes Recent Labs    Lab 01/02/19 1400 01/02/19 1924 01/03/19 0256  TROPONINI 0.21* 0.13* 0.11*    Recent Labs  Lab 01/02/19 1412  TROPIPOC 0.19*     BNP Recent Labs  Lab 01/02/19 1400  BNP 518.5*     DDimer No results for input(s): DDIMER in the last 168 hours.   Radiology    Dg Chest 1 View  Result Date: 01/02/2019 CLINICAL DATA:  Shortness of breath, fall. EXAM: CHEST - 2 VIEW COMPARISON:  Radiograph of January 18, 2018. FINDINGS: Stable cardiomegaly. Large hiatal hernia is noted. No pneumothorax or pleural effusion is noted. No acute pulmonary disease is noted. Bony thorax is unremarkable. IMPRESSION: No active cardiopulmonary disease.  Large hiatal hernia. Electronically Signed   By: Marijo Conception, M.D.   On: 01/02/2019 15:47   Dg Lumbar Spine Complete  Result Date: 01/02/2019 CLINICAL DATA:  Low back pain after fall. EXAM: LUMBAR SPINE - COMPLETE 4+ VIEW COMPARISON:  Radiographs of October 06, 2005. FINDINGS: Status post surgical posterior fusion of L3-4 and L4-5 with bilateral intrapedicular screw placement. Stable  grade 1 anterolisthesis of L4-5 is noted. Moderate degenerative disc disease is noted at L1-2 and L5-S1. No acute fracture is noted. Diffuse osteopenia is noted. IMPRESSION: Postsurgical and degenerative changes as described above. No acute abnormality seen in the lumbar spine. Electronically Signed   By: Marijo Conception, M.D.   On: 01/02/2019 15:49   Dg Sacrum/coccyx  Result Date: 01/02/2019 CLINICAL DATA:  Low back pain after fall. EXAM: SACRUM AND COCCYX - 2+ VIEW COMPARISON:  None. FINDINGS: Mildly displaced fracture is seen involving the left superior pubic ramus. Status post bilateral hip prostheses. The sacrum is unremarkable. Sacroiliac joints appear normal. IMPRESSION: Mildly displaced fracture involving the left superior pubic ramus. Sacrum and coccyx are unremarkable. Electronically Signed   By: Marijo Conception, M.D.   On: 01/02/2019 15:51   Ct Head Wo Contrast  Result  Date: 01/02/2019 CLINICAL DATA:  Dizziness, fall. EXAM: CT HEAD WITHOUT CONTRAST TECHNIQUE: Contiguous axial images were obtained from the base of the skull through the vertex without intravenous contrast. COMPARISON:  CT scan of January 18, 2018. FINDINGS: Brain: Mild diffuse cortical atrophy is noted. Mild chronic ischemic white matter disease is noted. Old lacunar infarction is noted in right basal ganglia. No mass effect or midline shift is noted. Ventricular size is within normal limits. There is no evidence of mass lesion, hemorrhage or acute infarction. Vascular: No hyperdense vessel or unexpected calcification. Skull: Normal. Negative for fracture or focal lesion. Sinuses/Orbits: No acute finding. Other: None. IMPRESSION: Mild diffuse cortical atrophy. Mild chronic ischemic white matter disease. No acute intracranial abnormality seen. Electronically Signed   By: Marijo Conception, M.D.   On: 01/02/2019 16:33   Ct Angio Chest Pe W/cm &/or Wo Cm  Result Date: 01/02/2019 CLINICAL DATA:  Fall 2 days ago.  Now with back pain. EXAM: CT ANGIOGRAPHY CHEST WITH CONTRAST TECHNIQUE: Multidetector CT imaging of the chest was performed using the standard protocol during bolus administration of intravenous contrast. Multiplanar CT image reconstructions and MIPs were obtained to evaluate the vascular anatomy. CONTRAST:  47mL ISOVUE-370 IOPAMIDOL (ISOVUE-370) INJECTION 76% COMPARISON:  Current chest radiographs.  Chest CT, 03/03/2018. FINDINGS: Cardiovascular: There is satisfactory opacification of the pulmonary arteries to the segmental level. There is no evidence of a pulmonary embolism. Heart is mildly enlarged. Trace pericardial effusion. Mild left coronary artery calcifications. Great vessels normal in caliber. There is aortic atherosclerosis, but no dissection aortic arch branch vessels demonstrate mild atherosclerosis, without significant stenosis. Left vertebral artery has its origin from aortic arch.  Mediastinum/Nodes: Large hernia includes the entire stomach as well as portions of the colon. No bowel incarceration or strangulation. Mild distention of the esophagus above the herniated stomach. No esophageal wall thickening or mass. Trachea is unremarkable. No neck base, axillary, mediastinal or hilar masses. No pathologically enlarged lymph nodes. Lungs/Pleura: Bilateral interstitial thickening. Mosaic attenuation is noted bilaterally. There is confluent opacity in the lower lobes adjacent to the large hernia, consistent with atelectasis. No pleural effusion.  No pneumothorax. Upper Abdomen: No acute finding. Musculoskeletal: No acute fracture. No osteoblastic or osteolytic lesions. Review of the MIP images confirms the above findings. IMPRESSION: 1. No evidence of a pulmonary embolism. 2. Compressive atelectasis of the medial lower lobes adjacent to a large hernia, centered at the esophageal hiatus. Hernia contains the stomach and portions of the colon. 3. No convincing pneumonia. Lungs show mosaic attenuation and mild interstitial thickening, new since the prior CT. Possible mild interstitial edema. Mosaic attenuation is more likely due  to small airways disease. 4. No acute fracture. 5. Coronary artery calcifications.  Aortic atherosclerosis. Aortic Atherosclerosis (ICD10-I70.0). Electronically Signed   By: Lajean Manes M.D.   On: 01/02/2019 16:34   Dg Hip Unilat W Or Wo Pelvis 2-3 Views Left  Result Date: 01/02/2019 CLINICAL DATA:  Left hip pain after fall. EXAM: DG HIP (WITH OR WITHOUT PELVIS) 2-3V LEFT COMPARISON:  None. FINDINGS: Moderately displaced fracture is seen involving the left superior pubic ramus near the acetabulum. Status post bilateral hip arthroplasties. IMPRESSION: Moderately displaced fracture seen involving the left superior pubic ramus. Electronically Signed   By: Marijo Conception, M.D.   On: 01/02/2019 15:52    Cardiac Studies   Echo ordered   Patient Profile     78 y.o.  female no prior cardiac history presents for fx of pelvis   Consulted for CHF     Assessment & Plan    1  Troponin   Trivial elevation   Echo pending today   2   Dyspnea   Lasix given with minimal response  UA from yesterday showed specific gravity ofg  Greater than 1.046   WIll net give furether lastix   AUtohydrate today Echo to evaluate LV function   Electrolytes pending   For questions or updates, please contact [facility] Please consult [email or web address] for contact info under        Signed, Dorris Carnes, MD  01/03/2019, 8:31 AM

## 2019-01-03 NOTE — Progress Notes (Signed)
[place] TEAM [place] A Ates  XBM:[id number] DOB: Apr 02, 1941 DOA: 01/02/2019 PCP: Wenda Low, MD    Brief Narrative:  78yo w/ a hx of hypertension, dementia, and frequent falls who presented w/ hip pain following a fall 2 days prior. ROS also noted a progressive intolerance to exertion, w/ building dyspnea.   In the ED she was noted to have a moderately displaced fracture of the left superior pubic ramus. CT angio was negative for pulmonary embolus but noted some interstitial edema.   Significant Events: 2/17 admit  2/18 TTE EF 55-60% with no WMA and no significant valvular abnormalities  Subjective: The patient is resting comfortably in bed.  She tells me she feels "normal."  She denies chest pain shortness of breath fevers chills nausea or vomiting.  She is anxious to be discharged home as soon as she can date.  Assessment & Plan:  Left superior pubic ramus fracture -frequent falls Orthopedics has seen - begin PT/OT - for outpt f/u   Very minutely elevated troponin TTE without focal wall motion abnormalities - Cardiology was consulted by admitting physician - no further evaluation is currently planned  Hypokalemia Due to combination of poor intake and diuretic therapy -supplement and follow -check magnesium in a.m.  Acute renal injury Baseline creatinine normal at 0.9 - has resolved   HTN BP climbing - follow trend w/o change for now  Dementia  DVT prophylaxis: lovenox  Code Status: FULL CODE Family Communication: Spoke with son and husband at bedside Disposition Plan: Safe for transfer to telemetry bed -PT/OT evaluations in a.m. -may be a candidate for discharge in 24-48 hours  Consultants:  Orthopedics Cardiology  Antimicrobials:  None  Objective: Blood pressure (!) 154/87, pulse 82, temperature 98.9 F (37.2 C), temperature source Oral, resp. rate (!) 23, height 5' 4.75" (1.645 m), weight 60.5 kg, SpO2 93 %.  Intake/Output  Summary (Last 24 hours) at 01/03/2019 1744 Last data filed at 01/03/2019 1705 Gross per 24 hour  Intake 1280 ml  Output 1925 ml  Net -645 ml   Filed Weights   01/02/19 1936 01/02/19 2216 01/03/19 0603  Weight: 61.2 kg 60.7 kg 60.5 kg    Examination: General: No acute respiratory distress Lungs: Clear to auscultation bilaterally without wheezes or crackles Cardiovascular: Regular rate and rhythm without murmur gallop or rub normal S1 and S2 Abdomen: Nontender, nondistended, soft, bowel sounds positive, no rebound, no ascites, no appreciable mass Extremities: No significant cyanosis, clubbing, or edema bilateral lower extremities  CBC: Recent Labs  Lab 01/02/19 1400 01/03/19 0256  WBC 11.6* 8.4  NEUTROABS 9.7*  --   HGB 13.8 12.6  HCT 43.6 38.8  MCV 98.0 96.5  PLT 249 027   Basic Metabolic Panel: Recent Labs  Lab 01/02/19 1400 01/03/19 0859  NA 137 137  K 4.0 2.9*  CL 100 99  CO2 23 27  GLUCOSE 116* 124*  BUN 14 16  CREATININE 1.42* 0.97  CALCIUM 9.0 8.6*   GFR: Estimated Creatinine Clearance: 41.9 mL/min (by C-G formula based on SCr of 0.97 mg/dL).  Liver Function Tests: No results for input(s): AST, ALT, ALKPHOS, BILITOT, PROT, ALBUMIN in the last 168 hours. No results for input(s): LIPASE, AMYLASE in the last 168 hours. No results for input(s): AMMONIA in the last 168 hours.   Cardiac Enzymes: Recent Labs  Lab 01/02/19 1400 01/02/19 1924 01/03/19 0256  TROPONINI 0.21* 0.13* 0.11*    HbA1C: Hgb A1c MFr Bld  Date/Time Value  Ref Range Status  01/02/2019 07:24 PM 5.3 4.8 - 5.6 % Final    Comment:    (NOTE)         Prediabetes: 5.7 - 6.4         Diabetes: >6.4         Glycemic control for adults with diabetes: <7.0     Scheduled Meds: . aspirin EC  81 mg Oral Daily  . benazepril  40 mg Oral Daily  . feeding supplement (ENSURE ENLIVE)  237 mL Oral TID BM  . metoprolol tartrate  25 mg Oral BID  . mirtazapine  15 mg Oral QHS     LOS: 1 day    Cherene Altes, MD [facility] Office  [phone] Pager - Text Page per Shea Evans  If 7PM-7AM, please contact night-coverage per Amion 01/03/2019, 5:44 PM

## 2019-01-04 DIAGNOSIS — I509 Heart failure, unspecified: Secondary | ICD-10-CM

## 2019-01-04 LAB — BASIC METABOLIC PANEL
Anion gap: 7 (ref 5–15)
BUN: 15 mg/dL (ref 8–23)
CO2: 32 mmol/L (ref 22–32)
CREATININE: 0.64 mg/dL (ref 0.44–1.00)
Calcium: 9 mg/dL (ref 8.9–10.3)
Chloride: 103 mmol/L (ref 98–111)
GFR calc Af Amer: >60 mL/min (ref >60)
GFR calc non Af Amer: >60 mL/min (ref >60)
Glucose, Bld: 116 mg/dL — ABNORMAL HIGH (ref 70–99)
Potassium: 3.9 mmol/L (ref 3.5–5.1)
Sodium: 142 mmol/L (ref 135–145)

## 2019-01-04 LAB — MAGNESIUM: Magnesium: 2.1 mg/dL (ref 1.7–2.4)

## 2019-01-04 LAB — CBC
HCT: 41.2 % (ref 36.0–46.0)
HEMOGLOBIN: 12.7 g/dL (ref 12.0–15.0)
MCH: 30.8 pg (ref 26.0–34.0)
MCHC: 30.8 g/dL (ref 30.0–36.0)
MCV: 100 fL (ref 80.0–100.0)
Platelets: 212 10*3/uL (ref 150–400)
RBC: 4.12 MIL/uL (ref 3.87–5.11)
RDW: 13.2 % (ref 11.5–15.5)
WBC: 6.6 10*3/uL (ref 4.0–10.5)
nRBC: 0 % (ref 0.0–0.2)

## 2019-01-04 MED ORDER — METOPROLOL TARTRATE 50 MG PO TABS
50.0000 mg | ORAL_TABLET | Freq: Two times a day (BID) | ORAL | Status: DC
  Administered 2019-01-04 – 2019-01-06 (×4): 50 mg via ORAL
  Filled 2019-01-04 (×4): qty 1

## 2019-01-04 MED ORDER — INFLUENZA VAC SPLIT QUAD 0.5 ML IM SUSY
0.5000 mL | PREFILLED_SYRINGE | INTRAMUSCULAR | Status: AC
  Administered 2019-01-05: 0.5 mL via INTRAMUSCULAR
  Filled 2019-01-04: qty 0.5

## 2019-01-04 MED ORDER — METOPROLOL TARTRATE 25 MG PO TABS
25.0000 mg | ORAL_TABLET | Freq: Once | ORAL | Status: AC
  Administered 2019-01-04: 25 mg via ORAL
  Filled 2019-01-04: qty 1

## 2019-01-04 MED ORDER — PNEUMOCOCCAL VAC POLYVALENT 25 MCG/0.5ML IJ INJ
0.5000 mL | INJECTION | INTRAMUSCULAR | Status: AC
  Administered 2019-01-05: 0.5 mL via INTRAMUSCULAR
  Filled 2019-01-04: qty 0.5

## 2019-01-04 MED ORDER — HYDRALAZINE HCL 20 MG/ML IJ SOLN
10.0000 mg | Freq: Once | INTRAMUSCULAR | Status: AC
  Administered 2019-01-04: 10 mg via INTRAVENOUS
  Filled 2019-01-04: qty 1

## 2019-01-04 NOTE — Evaluation (Signed)
Occupational Therapy Evaluation Patient Details Name: [name] MRN: [id number] DOB: Jul 29, 1941 Today's Date: 01/04/2019    History of Present Illness Pt is a 78 y.o. female admitted 01/02/19 with hip pain following a fall 2 days prior. Found to have displaced L superior pubic ramus fx; plan for nonoperative management (WBAT). S/p TEE 2/18. PMH includes dementia, HTN, frequent falls, R femur ORIF (01/2018), THA, TKA.   Clinical Impression   Pt PTA: Pt with dementia at baseline, living with spouse, reports independence with ADL and IADLs provided for pt. Pt currently limited by pain in low back and L hip. Pt modA for mobility and transfers. Pt requires sequencing cues to attend to task and perform ambulation from bed to chair ~3' with RW with modA for support. Pt set-upA for UB ADL- requires assist for opening containers. Pt mod to maxA for LB ADL. Pt requires continued OT skilled services for ADL, mobility and safety in SNF setting. OT to follow acutely focusing on ADL and safe mobility.    Follow Up Recommendations  SNF;Supervision/Assistance - 24 hour    Equipment Recommendations  (to be determined)    Recommendations for Other Services       Precautions / Restrictions Precautions Precautions: Fall Restrictions Weight Bearing Restrictions: Yes LLE Weight Bearing: Weight bearing as tolerated      Mobility Bed Mobility Overal bed mobility: Needs Assistance Bed Mobility: Supine to Sit     Supine to sit: HOB elevated;Mod assist     General bed mobility comments: ModA for HHA to assist trunk elevation and scoot L hip to EOB; pt c/o worsening pain upon sitting  Transfers Overall transfer level: Needs assistance Equipment used: Rolling walker (2 wheeled) Transfers: Sit to/from Stand Sit to Stand: Min assist;Mod assist         General transfer comment: MinA to assist trunk elevation for initial standing, cues for hand placement, pt too painful upon standing with  uncontrolled descent to EOB. ModA for second trial of standing, max cues/encouragement for upright posture and to maintain standing    Balance Overall balance assessment: Needs assistance   Sitting balance-Leahy Scale: Fair       Standing balance-Leahy Scale: Poor Standing balance comment: Reliant on UE support                           ADL either performed or assessed with clinical judgement   ADL Overall ADL's : Needs assistance/impaired Eating/Feeding: Supervision/ safety   Grooming: Set up;Sitting   Upper Body Bathing: Set up;Sitting   Lower Body Bathing: Moderate assistance   Upper Body Dressing : Set up   Lower Body Dressing: Moderate assistance   Toilet Transfer: Minimal assistance;BSC;RW;Ambulation   Toileting- Clothing Manipulation and Hygiene: Moderate assistance;Sit to/from stand       Functional mobility during ADLs: Minimal assistance;Rolling walker;Cueing for sequencing General ADL Comments: set-upA for UB and modA for LB at this time with pain     Vision   Vision Assessment?: No apparent visual deficits     Perception     Praxis      Pertinent Vitals/Pain Pain Assessment: Faces Faces Pain Scale: Hurts even more Pain Location: L hip into lower back Pain Descriptors / Indicators: Guarding;Grimacing;Sore Pain Intervention(s): Limited activity within patient's tolerance;Monitored during session     Hand Dominance     Extremity/Trunk Assessment Upper Extremity Assessment Upper Extremity Assessment: Overall WFL for tasks assessed   Lower Extremity Assessment Lower Extremity  Assessment: Generalized weakness;LLE deficits/detail LLE Deficits / Details: Knee flex/ext at least 3/5, hip flex <3/5 LLE Coordination: decreased gross motor   Cervical / Trunk Assessment Cervical / Trunk Assessment: Kyphotic   Communication Communication Communication: No difficulties   Cognition Arousal/Alertness: Awake/alert Behavior During Therapy:  WFL for tasks assessed/performed Overall Cognitive Status: History of cognitive impairments - at baseline Area of Impairment: Orientation;Attention;Memory;Following commands;Safety/judgement;Awareness;Problem solving                 Orientation Level: Disoriented to;Time;Situation Current Attention Level: Selective Memory: Decreased short-term memory Following Commands: Follows one step commands inconsistently Safety/Judgement: Decreased awareness of safety;Decreased awareness of deficits Awareness: Emergent Problem Solving: Slow processing;Difficulty sequencing;Requires verbal cues General Comments: H/o dementia. Pt initially reporting, "I did not fall, they must have me mixed up with someone else." Able to be redirected to fall/situation once she felt pain in hip upon sitting. Very repetitive, internally distracted by pain   General Comments  Educ on incentive spirometry; pt able to perform correct technique. SpO2 >90% on RA    Exercises Other Exercises Other Exercises: Educ on bilateral ankle pumps, heel slides, SLR (pt with difficulty performing full range of LLE SLR); pt able to demonstrate these   Shoulder Instructions      Home Living Family/patient expects to be discharged to:: Private residence Living Arrangements: Spouse/significant other Available Help at Discharge: Family;Available 24 hours/day Type of Home: House Home Access: Stairs to enter [employer] of Steps: 2 Entrance Stairs-Rails: Can reach both Home Layout: One level     Bathroom Shower/Tub: Occupational psychologist: Standard     Home Equipment: Grab bars - toilet;Wheelchair - [employer] - 2 wheels;Bedside commode   Additional Comments: Had been using BSC due to hip pain. Per RN, husband can ambulate with SPC, but also uses wheelchair      Prior Functioning/Environment Level of Independence: Needs assistance  Gait / Transfers Assistance Needed: Pt reports ambulatory with  RW ADL's / Homemaking Assistance Needed: Pt reports indep with ADLs; husband assists with cooking and cleaning Communication / Swallowing Assistance Needed: Independent with ADL and IADLs provided for her Comments: H/o dementia; unsure if completely reliable historian        OT Problem List: Decreased strength;Decreased activity tolerance;Decreased range of motion;Impaired balance (sitting and/or standing);Decreased coordination;Decreased safety awareness;Pain;Increased edema      OT Treatment/Interventions: Self-care/ADL training;Therapeutic exercise;Energy conservation;Therapeutic activities;Neuromuscular education;Balance training;Patient/family education    OT Goals(Current goals can be found in the care plan section) Acute Rehab OT Goals Patient Stated Goal: Agreeable to rehab at SNF before returning home OT Goal Formulation: With patient Time For Goal Achievement: 01/18/19 Potential to Achieve Goals: Fair  OT Frequency: Min 2X/week   Barriers to D/C:            Co-evaluation              AM-PAC OT "6 Clicks" Daily Activity     Outcome Measure Help from another person eating meals?: A Little Help from another person taking care of personal grooming?: None Help from another person toileting, which includes using toliet, bedpan, or urinal?: A Lot Help from another person bathing (including washing, rinsing, drying)?: A Lot Help from another person to put on and taking off regular upper body clothing?: A Little Help from another person to put on and taking off regular lower body clothing?: A Lot 6 Click Score: 16   End of Session Equipment Utilized During Treatment: Gait belt;Rolling walker Nurse Communication: Mobility  status  Activity Tolerance: Patient limited by pain;Patient tolerated treatment well Patient left: in chair;with chair alarm set;with nursing/sitter in room;with call bell/phone within reach  OT Visit Diagnosis: Unsteadiness on feet (R26.81);Muscle  weakness (generalized) (M62.81)                Time: 5009-3818 OT Time Calculation (min): 24 min Charges:  OT General Charges $OT Visit: 1 Visit OT Evaluation $OT Eval Moderate Complexity: 1 Mod  Darryl Nestle) Marsa Aris OTR/L Acute Rehabilitation Services Pager: [phone] Office: [phone]   Fredda Hammed 01/04/2019, 10:38 AM

## 2019-01-04 NOTE — Progress Notes (Signed)
[place] TEAM [place] A [place]  PZW:[id number] DOB: May 08, 1941 DOA: 01/02/2019 PCP: Wenda Low, MD    Brief Narrative:  40PN w/ a hx of hypertension, dementia, and frequent falls who presented w/ hip pain following a fall 2 days prior to admission.  In the ED, patient was noted to have a moderately displaced fracture of the left superior pubic ramus. CT angio was negative for pulmonary embolus but noted possible interstitial edema.   Significant Events: 2/17 admit  2/18 TTE EF 55-60% with no WMA and no significant valvular abnormalities  Subjective: The patient is resting comfortably in bed.   Pain is well controlled.   Assessment & Plan: Left superior pubic ramus fracture: -Conservative management.   -Orthopedic input is appreciated.   -Optimize pain control.   -Possibly discharge to skilled nursing facility.   Very minutely elevated troponin: TTE without focal wall motion abnormalities - Cardiology was consulted by admitting physician - no further evaluation is currently planned  Hypokalemia Due to combination of poor intake and diuretic therapy -supplement and follow -check magnesium in a.m. 12/25/2018: Resolved.  Potassium is 3.9 today  Acute renal injury: Baseline creatinine normal at 0.9 - has resolved   Hypertension: BP climbing - follow trend w/o change for now 01/04/2019: Significantly elevated blood pressure was noted earlier this morning.  However, blood pressure is better controlled.    Dementia  DVT prophylaxis: lovenox  Code Status: FULL CODE Family Communication: Husband and son at bedside. Disposition Plan: Pursue discharge to skilled nursing facility.  Consultants:  Orthopedics Cardiology  Antimicrobials:  None  Objective: Blood pressure 137/79, pulse 69, temperature 98.9 F (37.2 C), temperature source Oral, resp. rate (!) 23, height 5' 4.75" (1.645 m), weight 59.6 kg, SpO2 95 %.  Intake/Output Summary (Last 24 hours)  at 01/04/2019 1846 Last data filed at 01/04/2019 1822 Gross per 24 hour  Intake 1080 ml  Output -  Net 1080 ml   Filed Weights   01/02/19 2216 01/03/19 0603 01/04/19 0442  Weight: 60.7 kg 60.5 kg 59.6 kg    Examination: General: No acute respiratory distress Lungs: Clear to auscultation bilaterally without wheezes or crackles Cardiovascular: Regular rate and rhythm without murmur gallop or rub normal S1 and S2 Abdomen: Nontender, nondistended, soft, bowel sounds positive, no rebound, no ascites, no appreciable mass Extremities: No leg edema.  CBC: Recent Labs  Lab 01/02/19 1400 01/03/19 0256 01/04/19 0327  WBC 11.6* 8.4 6.6  NEUTROABS 9.7*  --   --   HGB 13.8 12.6 12.7  HCT 43.6 38.8 41.2  MCV 98.0 96.5 100.0  PLT 249 197 361   Basic Metabolic Panel: Recent Labs  Lab 01/02/19 1400 01/03/19 0859 01/04/19 0327  NA 137 137 142  K 4.0 2.9* 3.9  CL 100 99 103  CO2 23 27 32  GLUCOSE 116* 124* 116*  BUN 14 16 15   CREATININE 1.42* 0.97 0.64  CALCIUM 9.0 8.6* 9.0  MG  --   --  2.1   GFR: Estimated Creatinine Clearance: 50.8 mL/min (by C-G formula based on SCr of 0.64 mg/dL).  Liver Function Tests: No results for input(s): AST, ALT, ALKPHOS, BILITOT, PROT, ALBUMIN in the last 168 hours. No results for input(s): LIPASE, AMYLASE in the last 168 hours. No results for input(s): AMMONIA in the last 168 hours.   Cardiac Enzymes: Recent Labs  Lab 01/02/19 1400 01/02/19 1924 01/03/19 0256  TROPONINI 0.21* 0.13* 0.11*    HbA1C: Hgb A1c MFr Bld  Date/Time Value Ref Range Status  01/02/2019 07:24 PM 5.3 4.8 - 5.6 % Final    Comment:    (NOTE)         Prediabetes: 5.7 - 6.4         Diabetes: >6.4         Glycemic control for adults with diabetes: <7.0     Scheduled Meds: . aspirin EC  81 mg Oral Daily  . benazepril  40 mg Oral Daily  . enoxaparin (LOVENOX) injection  40 mg Subcutaneous Q24H  . feeding supplement (ENSURE ENLIVE)  237 mL Oral TID BM  . [START  ON 01/05/2019] Influenza vac split quadrivalent PF  0.5 mL Intramuscular Tomorrow-1000  . metoprolol tartrate  50 mg Oral BID  . mirtazapine  15 mg Oral QHS  . [START ON 01/05/2019] pneumococcal 23 valent vaccine  0.5 mL Intramuscular Tomorrow-1000     LOS: 2 days    Breanna Allan, MD [facility] Office  [phone] Pager - Text Page per Shea Evans  If 7PM-7AM, please contact night-coverage per Amion 01/04/2019, 6:46 PM

## 2019-01-04 NOTE — Clinical Social Work Note (Signed)
Clinical Social Work Assessment  Patient Details  Name: [name] MRN: [id number] Date of Birth: Dec 14, 1940  Date of referral:  01/04/19               Reason for consult:  Facility Placement, Discharge Planning                Permission sought to share information with:  Facility Sport and exercise psychologist, Family Supports Permission granted to share information::  Yes, Verbal Permission Granted  Name::     [name]  Agency::  SNFs  Relationship::  spouse  Contact Information:  [phone]  Housing/Transportation Living arrangements for the past 2 months:  Single Family Home Source of Information:  Patient, Adult Children, Spouse Patient Interpreter Needed:  None Criminal Activity/Legal Involvement Pertinent to Current Situation/Hospitalization:  No - Comment as needed Significant Relationships:  Adult Children, Spouse Lives with:  Spouse Do you feel safe going back to the place where you live?  Yes Need for family participation in patient care:  Yes (Comment)  Care giving concerns: Patient from home with spouse. PT recommending SNF.   Social Worker assessment / plan: CSW met with patient, spouse, and son at bedside. Patient alert and oriented. CSW introduced self and role and discussed disposition - PT recommendation for SNF.  Patient doesn't want to o to rehab, but family stated they are not able to take care of her at home unless she can walk like she did prior to admission. Patient understanding of family's statements and agreeable to SNF.   Patient's son indicated patient was in [place] last year and they do not want patient to return there. Son would like to talk to the available facilities about the details of their rehab program before choosing a facility. CSW provided CMS SNF list and faxed out initial referrals. Awaiting bed offers.   From initial review of CMS list, son prefers [facility]. Will provide bed offers when available. Patient will  need [facility] authorization prior to admitting to a facility. Facility to initiate auth once identified. CSW to follow and support with discharge planning.  Employment status:  Retired Research officer, political party) PT Recommendations:  [place] / Referral to community resources:  [place]  Patient/Family's Response to care: Patient and family appreciative of care.  Patient/Family's Understanding of and Emotional Response to Diagnosis, Current Treatment, and Prognosis: Patient and family with understanding of patient's condition and care needs. They are agreeable to SNF.  Emotional Assessment Appearance:  Appears stated age Attitude/Demeanor/Rapport:  Engaged Affect (typically observed):  Accepting, Calm, Appropriate Orientation:  Oriented to Self, Oriented to Place, Oriented to  Time, Oriented to Situation Alcohol / Substance use:  Not Applicable Psych involvement (Current and /or in the community):  No (Comment)  Discharge Needs  Concerns to be addressed:  Discharge Planning Concerns, Care Coordination Readmission within the last 30 days:  No Current discharge risk:  Physical Impairment Barriers to Discharge:  Continued Medical Work up, [place], [place] 01/04/2019, 2:27 PM

## 2019-01-04 NOTE — NC FL2 (Signed)
[place] LEVEL OF CARE SCREENING TOOL     IDENTIFICATION  Patient Name: [name] Birthdate: 1941/05/27 Sex: female Admission Date (Current Location): 01/02/2019  [facility] and [place] Number:  Herbalist and Address:  [facility], [place] [address], [place], [place] [address]      Provider Number: [id number]  Attending Physician Name and Address:  Bonnell Public, MD  Relative Name and Phone Number:  Indyah Saulnier, spouse, [phone]    Current Level of Care: Hospital Recommended Level of Care: [place] Prior Approval Number:    Date Approved/Denied:   PASRR Number: [id number]  Discharge Plan: SNF    Current Diagnoses: Patient Active Problem List   Diagnosis Date Noted  . NSTEMI (non-ST elevated myocardial infarction) ([place]) 01/02/2019  . Femur fracture, right ([place]) 01/18/2018  . B12 deficiency 01/18/2018  . Hypertension   . Thyroid disease   . Dementia ([place])   . Memory difficulty     Orientation RESPIRATION BLADDER Height & Weight     Self, Time, Place, Situation  O2(nasal cannula 2L) Continent, Indwelling catheter Weight: 59.6 kg Height:  5' 4.75" (164.5 cm)  BEHAVIORAL SYMPTOMS/MOOD NEUROLOGICAL BOWEL NUTRITION STATUS      Continent Diet(please see DC summary)  AMBULATORY STATUS COMMUNICATION OF NEEDS Skin   Limited Assist Verbally Normal                       Personal Care Assistance Level of Assistance  Bathing, Feeding, Dressing Bathing Assistance: Limited assistance Feeding assistance: Independent Dressing Assistance: Limited assistance     Functional Limitations Info  Sight, Hearing, Speech Sight Info: Impaired Hearing Info: Adequate Speech Info: Adequate    SPECIAL CARE FACTORS FREQUENCY  PT (By licensed PT), OT (By licensed OT)     PT Frequency: 5x/week OT Frequency: 5x/week            Contractures Contractures Info: Not present    Additional Factors  Info  Code Status, Allergies, Psychotropic Code Status Info: Full Allergies Info: Codeine Psychotropic Info: remeron         Current Medications (01/04/2019):  This is the current hospital active medication list Current Facility-Administered Medications  Medication Dose Route Frequency Provider Last Rate Last Dose  . acetaminophen (TYLENOL) tablet 650 mg  650 mg Oral Q4H PRN Vashti Hey, MD   650 mg at 01/03/19 0007  . albuterol (PROVENTIL) (2.5 MG/3ML) 0.083% nebulizer solution 2.5 mg  2.5 mg Nebulization Q2H PRN Cherene Altes, MD   2.5 mg at 01/03/19 1450  . aspirin EC tablet 81 mg  81 mg Oral Daily Bonnell Public Tublu, MD   81 mg at 01/04/19 0834  . benazepril (LOTENSIN) tablet 40 mg  40 mg Oral Daily Bonnell Public Tublu, MD   40 mg at 01/04/19 0834  . enoxaparin (LOVENOX) injection 40 mg  40 mg Subcutaneous Q24H Cherene Altes, MD   40 mg at 01/03/19 2100  . feeding supplement (ENSURE ENLIVE) (ENSURE ENLIVE) liquid 237 mL  237 mL Oral TID BM Bonnell Public Tublu, MD   237 mL at 01/03/19 2111  . fentaNYL (SUBLIMAZE) injection 12.5 mcg  12.5 mcg Intravenous Q2H PRN Cherene Altes, MD   12.5 mcg at 01/04/19 1039  . ketorolac (TORADOL) tablet 10 mg  10 mg Oral Q8H PRN Cherene Altes, MD   10 mg at 01/04/19 0501  . metoprolol tartrate (LOPRESSOR) tablet 25 mg  25 mg Oral Once Fay Records, MD      . metoprolol tartrate (LOPRESSOR) tablet 50 mg  50 mg Oral BID Fay Records, MD      . mirtazapine (REMERON) tablet 15 mg  15 mg Oral QHS Bonnell Public Tublu, MD   15 mg at 01/03/19 2111  . nitroGLYCERIN (NITROSTAT) SL tablet 0.4 mg  0.4 mg Sublingual Q5 Min x 3 PRN Bonnell Public Tublu, MD      . ondansetron [facility]) injection 4 mg  4 mg Intravenous Q6H PRN Vashti Hey, MD         Discharge Medications: Please see discharge summary for a list of discharge medications.  Relevant Imaging Results:  Relevant Lab  Results:   Additional Information SSN: [id number]  Estanislado Emms, LCSW

## 2019-01-04 NOTE — Progress Notes (Signed)
NP on call notified of pt's bp ranging from 140s-170s/90s-low 1teens. New orders for 10mg  of IV hydralazine. Will continue to monitor pt. [place] Brunette, RN

## 2019-01-04 NOTE — Evaluation (Addendum)
Physical Therapy Evaluation Patient Details Name: [name] MRN: [id number] DOB: 1941/08/06 Today's Date: 01/04/2019   History of Present Illness  Pt is a 78 y.o. female admitted 01/02/19 with hip pain following a fall 2 days prior. Found to have displaced L superior pubic ramus fx; plan for nonoperative management (WBAT). S/p TEE 2/18. PMH includes dementia, HTN, frequent falls, R femur ORIF (01/2018), THA, TKA.    Clinical Impression  Pt presents with an overall decrease in functional mobility secondary to above. PTA, pt reports mod indep with RW for ambulation and ADLs; husband assists with IADLs. Today, pt required min-modA for transfers and amb with RW; limited by pain, fatigue and generalized weakness. Educ on precautions, positioning, therex, and importance of mobility. Pt would benefit from continued acute PT services to maximize functional mobility and independence prior to d/c with SNF-level therapies.   SpO2 >90% on RA    Follow Up Recommendations SNF;Supervision for mobility/OOB    Equipment Recommendations  None recommended by PT    Recommendations for Other Services       Precautions / Restrictions Precautions Precautions: Fall Restrictions Weight Bearing Restrictions: Yes LLE Weight Bearing: Weight bearing as tolerated      Mobility  Bed Mobility Overal bed mobility: Needs Assistance Bed Mobility: Supine to Sit     Supine to sit: HOB elevated;Mod assist     General bed mobility comments: ModA for HHA to assist trunk elevation and scoot L hip to EOB; pt c/o worsening pain upon sitting  Transfers Overall transfer level: Needs assistance Equipment used: Rolling walker (2 wheeled) Transfers: Sit to/from Stand Sit to Stand: Min assist;Mod assist         General transfer comment: MinA to assist trunk elevation for initial standing, cues for hand placement, pt too painful upon standing with uncontrolled descent to EOB. ModA for second trial of standing,  max cues/encouragement for upright posture and to maintain standing  Ambulation/Gait Ambulation/Gait assistance: Min assist Gait Distance (Feet): 5 Feet Assistive device: Rolling walker (2 wheeled) Gait Pattern/deviations: Step-to pattern;Decreased weight shift to left;Decreased dorsiflexion - right;Antalgic Gait velocity: Decreased Gait velocity interpretation: <1.31 ft/sec, indicative of household ambulator General Gait Details: Slow, antalgic gait with RW requiring consistent minA to navigate RW and encourage pt to continue moving forward. Pt hesitant to shift weight onto LLE, requiring cues to pick up R foot completely. Painful throughout  Stairs            Wheelchair Mobility    Modified Rankin (Stroke Patients Only)       Balance Overall balance assessment: Needs assistance   Sitting balance-Leahy Scale: Fair       Standing balance-Leahy Scale: Poor Standing balance comment: Reliant on UE support                             Pertinent Vitals/Pain Pain Assessment: Faces Faces Pain Scale: Hurts even more Pain Location: L hip into lower back Pain Descriptors / Indicators: Guarding;Grimacing;Sore Pain Intervention(s): Limited activity within patient's tolerance;Monitored during session    [place] expects to be discharged to:: Private residence Living Arrangements: Spouse/significant other Available Help at Discharge: Family;Available 24 hours/day Type of Home: House Home Access: Stairs to enter Entrance Stairs-Rails: Can reach both Entrance Stairs-Number of Steps: 2 Home Layout: One level Home Equipment: Grab bars - toilet;Wheelchair - [employer] - 2 wheels;Bedside commode Additional Comments: Had been using BSC due to hip pain. Per RN, husband  can ambulate with SPC, but also uses wheelchair    Prior Function Level of Independence: Needs assistance   Gait / Transfers Assistance Needed: Pt reports ambulatory with RW  ADL's /  Homemaking Assistance Needed: Pt reports indep with ADLs; husband assists with cooking and cleaning  Comments: H/o dementia; unsure if completely reliable historian     Hand Dominance        Extremity/Trunk Assessment   Upper Extremity Assessment Upper Extremity Assessment: Overall WFL for tasks assessed    Lower Extremity Assessment Lower Extremity Assessment: Generalized weakness;LLE deficits/detail LLE Deficits / Details: Knee flex/ext at least 3/5, hip flex <3/5 LLE Coordination: decreased gross motor    Cervical / Trunk Assessment Cervical / Trunk Assessment: Kyphotic  Communication   Communication: No difficulties  Cognition Arousal/Alertness: Awake/alert Behavior During Therapy: WFL for tasks assessed/performed Overall Cognitive Status: History of cognitive impairments - at baseline Area of Impairment: Orientation;Attention;Memory;Following commands;Safety/judgement;Awareness;Problem solving                 Orientation Level: Disoriented to;Time;Situation Current Attention Level: Selective Memory: Decreased short-term memory Following Commands: Follows one step commands inconsistently Safety/Judgement: Decreased awareness of safety;Decreased awareness of deficits Awareness: Emergent Problem Solving: Slow processing;Difficulty sequencing;Requires verbal cues General Comments: H/o dementia. Pt initially reporting, "I did not fall, they must have me mixed up with someone else." Able to be redirected to fall/situation once she felt pain in hip upon sitting. Very repetitive, internally distracted by pain      General Comments General comments (skin integrity, edema, etc.): Educ on incentive spirometry; pt able to perform correct technique. SpO2 >90% on RA    Exercises Other Exercises Other Exercises: Educ on bilateral ankle pumps, heel slides, SLR (pt with difficulty performing full range of LLE SLR); pt able to demonstrate these   Assessment/Plan    PT  Assessment Patient needs continued PT services  PT Problem List Decreased strength;Decreased range of motion;Decreased activity tolerance;Decreased balance;Decreased mobility;Decreased cognition;Decreased knowledge of use of DME;Decreased safety awareness;Decreased knowledge of precautions;Pain       PT Treatment Interventions DME instruction;Gait training;Functional mobility training;Therapeutic exercise;Balance training;Patient/family education    PT Goals (Current goals can be found in the Care Plan section)  Acute Rehab PT Goals Patient Stated Goal: Agreeable to rehab at SNF before returning home PT Goal Formulation: With patient Time For Goal Achievement: 01/18/19 Potential to Achieve Goals: Good    Frequency Min 3X/week   Barriers to discharge Decreased caregiver support Husband not able to provide consistent physical assist due to his own medical/physical condition    Co-evaluation               AM-PAC PT "6 Clicks" Mobility  Outcome Measure Help needed turning from your back to your side while in a flat bed without using bedrails?: A Little Help needed moving from lying on your back to sitting on the side of a flat bed without using bedrails?: A Lot Help needed moving to and from a bed to a chair (including a wheelchair)?: A Lot Help needed standing up from a chair using your arms (e.g., wheelchair or bedside chair)?: A Little Help needed to walk in hospital room?: A Little Help needed climbing 3-5 steps with a railing? : A Lot 6 Click Score: 15    End of Session Equipment Utilized During Treatment: Gait belt Activity Tolerance: Patient tolerated treatment well;Patient limited by pain Patient left: in chair;with call bell/phone within reach;with chair alarm set;with nursing/sitter in room Nurse Communication: Mobility  status PT Visit Diagnosis: Other abnormalities of gait and mobility (R26.89);Pain Pain - Right/Left: Left Pain - part of body: Hip    Time:  5183-4373 PT Time Calculation (min) (ACUTE ONLY): 24 min   Charges:   PT Evaluation $PT Eval Moderate Complexity: 1 Mod        Mabeline Caras, PT, DPT Acute Rehabilitation Services  Pager [phone] Office [place] 01/04/2019, 10:00 AM

## 2019-01-04 NOTE — Progress Notes (Addendum)
Progress Note  Patient Name: [name] Date of Encounter: 01/04/2019  Primary Cardiologist: New to [facility] (Dr. Harrington Challenger)  Subjective   No major complaints this morning. Currently eating breakfast. Denies CP. No dyspnea. History however may be unreliable. She has reported dementia and does not remember her recent fall that she was admitted for.   Inpatient Medications    Scheduled Meds: . aspirin EC  81 mg Oral Daily  . benazepril  40 mg Oral Daily  . enoxaparin (LOVENOX) injection  40 mg Subcutaneous Q24H  . feeding supplement (ENSURE ENLIVE)  237 mL Oral TID BM  . metoprolol tartrate  25 mg Oral BID  . mirtazapine  15 mg Oral QHS   Continuous Infusions:  PRN Meds: acetaminophen, albuterol, fentaNYL (SUBLIMAZE) injection, ketorolac, nitroGLYCERIN, ondansetron (ZOFRAN) IV   Vital Signs    Vitals:   01/04/19 0445 01/04/19 0610 01/04/19 0612 01/04/19 0712  BP: (!) 157/112 (!) 194/95 (!) 194/95 135/68  Pulse: 82  86 77  Resp: (!) 22  (!) 28 (!) 23  Temp:      TempSrc:      SpO2: 95%  99% 95%  Weight:      Height:        Intake/Output Summary (Last 24 hours) at 01/04/2019 0900 Last data filed at 01/03/2019 1828 Gross per 24 hour  Intake 114 ml  Output 625 ml  Net -511 ml   Last 3 Weights 01/04/2019 01/03/2019 01/02/2019  Weight (lbs) 131 lb 6.3 oz 133 lb 6.1 oz 133 lb 12.8 oz  Weight (kg) 59.6 kg 60.5 kg 60.691 kg      Telemetry    NSR - Personally Reviewed  ECG    NSR 91 bpm,  Subtle ST downsloping in lateral precordial leads, ? repol abormalities- Personally Reviewed  Physical Exam   GEN: elderly WF in no acute distress.   Neck: No JVD Cardiac: RRR, no murmurs, rubs, or gallops.  Respiratory: Clear to auscultation bilaterally. GI: Soft, nontender, non-distended  MS: No edema; No deformity. Neuro:  dementia at baseline  Psych: Normal affect   Labs    Chemistry Recent Labs  Lab 01/02/19 1400 01/03/19 0859 01/04/19 0327  NA 137 137 142  K 4.0  2.9* 3.9  CL 100 99 103  CO2 23 27 32  GLUCOSE 116* 124* 116*  BUN 14 16 15   CREATININE 1.42* 0.97 0.64  CALCIUM 9.0 8.6* 9.0  GFRNONAA 35* 56* >60  GFRAA 41* >60 >60  ANIONGAP 14 11 7      Hematology Recent Labs  Lab 01/02/19 1400 01/03/19 0256 01/04/19 0327  WBC 11.6* 8.4 6.6  RBC 4.45 4.02 4.12  HGB 13.8 12.6 12.7  HCT 43.6 38.8 41.2  MCV 98.0 96.5 100.0  MCH 31.0 31.3 30.8  MCHC 31.7 32.5 30.8  RDW 13.4 13.2 13.2  PLT 249 197 212    Cardiac Enzymes Recent Labs  Lab 01/02/19 1400 01/02/19 1924 01/03/19 0256  TROPONINI 0.21* 0.13* 0.11*    Recent Labs  Lab 01/02/19 1412  TROPIPOC 0.19*     BNP Recent Labs  Lab 01/02/19 1400 01/03/19 0859  BNP 518.5* 209.8*     DDimer No results for input(s): DDIMER in the last 168 hours.   Radiology    Dg Chest 1 View  Result Date: 01/02/2019 CLINICAL DATA:  Shortness of breath, fall. EXAM: CHEST - 2 VIEW COMPARISON:  Radiograph of January 18, 2018. FINDINGS: Stable cardiomegaly. Large hiatal hernia is noted. No pneumothorax or  pleural effusion is noted. No acute pulmonary disease is noted. Bony thorax is unremarkable. IMPRESSION: No active cardiopulmonary disease.  Large hiatal hernia. Electronically Signed   By: Marijo Conception, M.D.   On: 01/02/2019 15:47   Dg Lumbar Spine Complete  Result Date: 01/02/2019 CLINICAL DATA:  Low back pain after fall. EXAM: LUMBAR SPINE - COMPLETE 4+ VIEW COMPARISON:  Radiographs of October 06, 2005. FINDINGS: Status post surgical posterior fusion of L3-4 and L4-5 with bilateral intrapedicular screw placement. Stable grade 1 anterolisthesis of L4-5 is noted. Moderate degenerative disc disease is noted at L1-2 and L5-S1. No acute fracture is noted. Diffuse osteopenia is noted. IMPRESSION: Postsurgical and degenerative changes as described above. No acute abnormality seen in the lumbar spine. Electronically Signed   By: Marijo Conception, M.D.   On: 01/02/2019 15:49   Dg  Sacrum/coccyx  Result Date: 01/02/2019 CLINICAL DATA:  Low back pain after fall. EXAM: SACRUM AND COCCYX - 2+ VIEW COMPARISON:  None. FINDINGS: Mildly displaced fracture is seen involving the left superior pubic ramus. Status post bilateral hip prostheses. The sacrum is unremarkable. Sacroiliac joints appear normal. IMPRESSION: Mildly displaced fracture involving the left superior pubic ramus. Sacrum and coccyx are unremarkable. Electronically Signed   By: Marijo Conception, M.D.   On: 01/02/2019 15:51   Ct Head Wo Contrast  Result Date: 01/02/2019 CLINICAL DATA:  Dizziness, fall. EXAM: CT HEAD WITHOUT CONTRAST TECHNIQUE: Contiguous axial images were obtained from the base of the skull through the vertex without intravenous contrast. COMPARISON:  CT scan of January 18, 2018. FINDINGS: Brain: Mild diffuse cortical atrophy is noted. Mild chronic ischemic white matter disease is noted. Old lacunar infarction is noted in right basal ganglia. No mass effect or midline shift is noted. Ventricular size is within normal limits. There is no evidence of mass lesion, hemorrhage or acute infarction. Vascular: No hyperdense vessel or unexpected calcification. Skull: Normal. Negative for fracture or focal lesion. Sinuses/Orbits: No acute finding. Other: None. IMPRESSION: Mild diffuse cortical atrophy. Mild chronic ischemic white matter disease. No acute intracranial abnormality seen. Electronically Signed   By: Marijo Conception, M.D.   On: 01/02/2019 16:33   Ct Angio Chest Pe W/cm &/or Wo Cm  Result Date: 01/02/2019 CLINICAL DATA:  Fall 2 days ago.  Now with back pain. EXAM: CT ANGIOGRAPHY CHEST WITH CONTRAST TECHNIQUE: Multidetector CT imaging of the chest was performed using the standard protocol during bolus administration of intravenous contrast. Multiplanar CT image reconstructions and MIPs were obtained to evaluate the vascular anatomy. CONTRAST:  39mL ISOVUE-370 IOPAMIDOL (ISOVUE-370) INJECTION 76% COMPARISON:  Current  chest radiographs.  Chest CT, 03/03/2018. FINDINGS: Cardiovascular: There is satisfactory opacification of the pulmonary arteries to the segmental level. There is no evidence of a pulmonary embolism. Heart is mildly enlarged. Trace pericardial effusion. Mild left coronary artery calcifications. Great vessels normal in caliber. There is aortic atherosclerosis, but no dissection aortic arch branch vessels demonstrate mild atherosclerosis, without significant stenosis. Left vertebral artery has its origin from aortic arch. Mediastinum/Nodes: Large hernia includes the entire stomach as well as portions of the colon. No bowel incarceration or strangulation. Mild distention of the esophagus above the herniated stomach. No esophageal wall thickening or mass. Trachea is unremarkable. No neck base, axillary, mediastinal or hilar masses. No pathologically enlarged lymph nodes. Lungs/Pleura: Bilateral interstitial thickening. Mosaic attenuation is noted bilaterally. There is confluent opacity in the lower lobes adjacent to the large hernia, consistent with atelectasis. No pleural effusion.  No  pneumothorax. Upper Abdomen: No acute finding. Musculoskeletal: No acute fracture. No osteoblastic or osteolytic lesions. Review of the MIP images confirms the above findings. IMPRESSION: 1. No evidence of a pulmonary embolism. 2. Compressive atelectasis of the medial lower lobes adjacent to a large hernia, centered at the esophageal hiatus. Hernia contains the stomach and portions of the colon. 3. No convincing pneumonia. Lungs show mosaic attenuation and mild interstitial thickening, new since the prior CT. Possible mild interstitial edema. Mosaic attenuation is more likely due to small airways disease. 4. No acute fracture. 5. Coronary artery calcifications.  Aortic atherosclerosis. Aortic Atherosclerosis (ICD10-I70.0). Electronically Signed   By: Lajean Manes M.D.   On: 01/02/2019 16:34   Dg Hip Unilat W Or Wo Pelvis 2-3 Views  Left  Result Date: 01/02/2019 CLINICAL DATA:  Left hip pain after fall. EXAM: DG HIP (WITH OR WITHOUT PELVIS) 2-3V LEFT COMPARISON:  None. FINDINGS: Moderately displaced fracture is seen involving the left superior pubic ramus near the acetabulum. Status post bilateral hip arthroplasties. IMPRESSION: Moderately displaced fracture seen involving the left superior pubic ramus. Electronically Signed   By: Marijo Conception, M.D.   On: 01/02/2019 15:52    Cardiac Studies   2D Echo 01/03/19 IMPRESSIONS    1. The left ventricle has normal systolic function, with an ejection fraction of 55-60%. The cavity size was normal. Left ventricular diastolic Doppler parameters are indeterminate No evidence of left ventricular regional wall motion abnormalities.  2. No evidence of left ventricular regional wall motion abnormalities.  3. The right ventricle has normal systolic function. The cavity was normal. There is no increase in right ventricular wall thickness. Right ventricular systolic pressure could not be assessed.  4. The mitral valve is normal in structure. There is mild mitral annular calcification present.  5. The tricuspid valve is normal in structure.  6. The aortic valve is tricuspid Mild thickening of the aortic valve Mild calcification of the aortic valve.  7. Right atrial pressure is estimated at 3 mmHg.  FINDINGS  Left Ventricle: The left ventricle has normal systolic function, with an ejection fraction of 55-60%. The cavity size was normal. There is no increase in left ventricular wall thickness. Left ventricular diastolic Doppler parameters are indeterminate No  evidence of left ventricular regional wall motion abnormalities.. Right Ventricle: The right ventricle has normal systolic function. The cavity was normal. There is no increase in right ventricular wall thickness. Right ventricular systolic pressure could not be assessed.  Patient Profile     78 y.o. female w/ h/o HTN, dementia  and falls, who presented w/ a mechanical fall w/ subsequent left pubic ramus fx. Per ortho, plan is for non operative management. Cardiology was consulted 2/17 for CHF and elevated Troponin   Assessment & Plan    1. HFpEF: initial BNP was elevated at 518.5 on 2/17. Lasix given and repeat BNP yesterday down to 209.8  Echo with normal LVEF, 55-60%. Left ventricular diastolic Doppler parameters are indeterminate. RV function normal. No significant valvular abnormalities. She appears comfortable at rest. Normal respirations and she does not appear grossly volume overloaded. Continue antihypertensives for BP control. May consider d/c home with PRN diuretic.   2. Elevated Troponin: troponin trend, 0.21>>0.13>>0.11. She denies CP, but history may be unreliable given her dementia. Per consult note, her husband reported that she endorsed recent CP but he is not present.  Her echo is reassuring. She has normal LVEF and no WMAs and no significant valvular abnormalities. Given age and dementia,  no plans for further ischemic w/u at this time. Medical management. Continue ASA,  blocker and ACEi.   3. Pubic Ramus Fx: 2/2 mechanical fall. Per ortho, plan is for non operative management.   4. Dementia: per primary.   5. HTN: controlled this morning at 135/68. Continue benazepril and metoprolol.   6. Hypokalemia: K was 2.9 yesterday. Supplementation given. K back WNL today at 3.9.   7. AKI: resolved. SCr was 1.42 on 2/17. Normal at 0.64 today.   For questions or updates, please contact [place] Please consult [email or web address] for contact info under        Signed, Lyda Jester, PA-C  01/04/2019, 9:00 AM    Patient seen and examined   I agree with findings as noted by B Simmons above PT is comfortable eating in chair   Breathing is fair Denies pains Lungs are rel clear Cardiac RRR   No S3    Ext are without edema  Echo shows LVEF is normal without wall motion abnormalties  Given age, dementia  and currentl lack of symptoms I would recomm continued medical Rx as she is getting    I would not plan further cardiac testing for now   See how she does as she continues to recover from pelvic fx.  Dorris Carnes MD

## 2019-01-05 MED ORDER — AMLODIPINE BESYLATE 2.5 MG PO TABS
2.5000 mg | ORAL_TABLET | Freq: Every day | ORAL | Status: DC
  Administered 2019-01-05 – 2019-01-06 (×2): 2.5 mg via ORAL
  Filled 2019-01-05 (×2): qty 1

## 2019-01-05 NOTE — Progress Notes (Signed)
[place] TEAM Laurel A Demo  XBW:[id number] DOB: 05-Sep-1941 DOA: 01/02/2019 PCP: Wenda Low, MD    Brief Narrative:  14LA w/ a hx of hypertension, dementia, and frequent falls who presented w/ hip pain following a fall 2 days prior to admission.  In the ED, patient was noted to have a moderately displaced fracture of the left superior pubic ramus. CT angio was negative for pulmonary embolus but noted possible interstitial edema.  01/05/2019: Is awaiting insurance approval.  Patient is stable for discharge.  Significant Events: 2/17 admit  2/18 TTE EF 55-60% with no WMA and no significant valvular abnormalities  Subjective: No complaints. Pain is well controlled.   Assessment & Plan: Left superior pubic ramus fracture: -Conservative management.   -Orthopedic input is appreciated.   -Optimize pain control.   -Possibly discharge to skilled nursing facility.  01/05/2019: Pursue disposition.  Insurance approval is being awaited.  Very minutely elevated troponin: TTE without focal wall motion abnormalities - Cardiology was consulted by admitting physician - no further evaluation is currently planned  Hypokalemia Due to combination of poor intake and diuretic therapy -supplement and follow -check magnesium in a.m. 12/25/2018: Resolved.  Potassium is 3.9 today  Acute renal injury: Baseline creatinine normal at 0.9 - has resolved   Hypertension: BP climbing - follow trend w/o change for now 01/04/2019: Significantly elevated blood pressure was noted earlier this morning.  However, blood pressure is better controlled.    Dementia  DVT prophylaxis: lovenox  Code Status: FULL CODE Family Communication: Husband and son at bedside. Disposition Plan: Pursue discharge to skilled nursing facility.  Consultants:  Orthopedics Cardiology  Antimicrobials:  None  Objective: Blood pressure (!) 155/94, pulse 81, temperature 97.6 F (36.4 C), temperature  source Oral, resp. rate (!) 27, height 5' 4.75" (1.645 m), weight 59.4 kg, SpO2 95 %.  Intake/Output Summary (Last 24 hours) at 01/05/2019 1646 Last data filed at 01/05/2019 0800 Gross per 24 hour  Intake 1080 ml  Output 500 ml  Net 580 ml   Filed Weights   01/03/19 0603 01/04/19 0442 01/05/19 0434  Weight: 60.5 kg 59.6 kg 59.4 kg    Examination: General: No acute respiratory distress Lungs: Clear to auscultation bilaterally without wheezes or crackles Cardiovascular: Regular rate and rhythm without murmur gallop or rub normal S1 and S2 Abdomen: Nontender, nondistended, soft, bowel sounds positive, no rebound, no ascites, no appreciable mass Extremities: No leg edema.  CBC: Recent Labs  Lab 01/02/19 1400 01/03/19 0256 01/04/19 0327  WBC 11.6* 8.4 6.6  NEUTROABS 9.7*  --   --   HGB 13.8 12.6 12.7  HCT 43.6 38.8 41.2  MCV 98.0 96.5 100.0  PLT 249 197 453   Basic Metabolic Panel: Recent Labs  Lab 01/02/19 1400 01/03/19 0859 01/04/19 0327  NA 137 137 142  K 4.0 2.9* 3.9  CL 100 99 103  CO2 23 27 32  GLUCOSE 116* 124* 116*  BUN 14 16 15   CREATININE 1.42* 0.97 0.64  CALCIUM 9.0 8.6* 9.0  MG  --   --  2.1   GFR: Estimated Creatinine Clearance: 50.8 mL/min (by C-G formula based on SCr of 0.64 mg/dL).  Liver Function Tests: No results for input(s): AST, ALT, ALKPHOS, BILITOT, PROT, ALBUMIN in the last 168 hours. No results for input(s): LIPASE, AMYLASE in the last 168 hours. No results for input(s): AMMONIA in the last 168 hours.   Cardiac Enzymes: Recent Labs  Lab 01/02/19 1400 01/02/19 1924  01/03/19 0256  TROPONINI 0.21* 0.13* 0.11*    HbA1C: Hgb A1c MFr Bld  Date/Time Value Ref Range Status  01/02/2019 07:24 PM 5.3 4.8 - 5.6 % Final    Comment:    (NOTE)         Prediabetes: 5.7 - 6.4         Diabetes: >6.4         Glycemic control for adults with diabetes: <7.0     Scheduled Meds: . amLODipine  2.5 mg Oral Daily  . aspirin EC  81 mg Oral  Daily  . benazepril  40 mg Oral Daily  . enoxaparin (LOVENOX) injection  40 mg Subcutaneous Q24H  . feeding supplement (ENSURE ENLIVE)  237 mL Oral TID BM  . metoprolol tartrate  50 mg Oral BID  . mirtazapine  15 mg Oral QHS     LOS: 3 days    Dana Allan, MD [facility] Office  [phone] Pager - Text Page per Shea Evans  If 7PM-7AM, please contact night-coverage per Amion 01/05/2019, 4:46 PM

## 2019-01-05 NOTE — Progress Notes (Signed)
Progress Note  Patient Name: [name] Date of Encounter: 01/05/2019  Primary Cardiologist: New to [facility] (Dr. Harrington Challenger)  Subjective   Pt complains of signif back pain   SOme SOB  NO CP   Inpatient Medications    Scheduled Meds: . aspirin EC  81 mg Oral Daily  . benazepril  40 mg Oral Daily  . enoxaparin (LOVENOX) injection  40 mg Subcutaneous Q24H  . feeding supplement (ENSURE ENLIVE)  237 mL Oral TID BM  . Influenza vac split quadrivalent PF  0.5 mL Intramuscular Tomorrow-1000  . metoprolol tartrate  50 mg Oral BID  . mirtazapine  15 mg Oral QHS  . pneumococcal 23 valent vaccine  0.5 mL Intramuscular Tomorrow-1000   Continuous Infusions:  PRN Meds: acetaminophen, albuterol, fentaNYL (SUBLIMAZE) injection, ketorolac, nitroGLYCERIN, ondansetron (ZOFRAN) IV   Vital Signs    Vitals:   01/04/19 2021 01/04/19 2024 01/05/19 0429 01/05/19 0434  BP: (!) 156/78  (!) 162/114   Pulse: 96  85   Resp: (!) 28  20   Temp:  98.6 F (37 C) 98.7 F (37.1 C)   TempSrc:  Oral Oral   SpO2: 97%  94%   Weight:    59.4 kg  Height:        Intake/Output Summary (Last 24 hours) at 01/05/2019 0624 Last data filed at 01/05/2019 0434 Gross per 24 hour  Intake 1560 ml  Output 850 ml  Net 710 ml   Last 3 Weights 01/05/2019 01/04/2019 01/03/2019  Weight (lbs) 130 lb 15.3 oz 131 lb 6.3 oz 133 lb 6.1 oz  Weight (kg) 59.4 kg 59.6 kg 60.5 kg      Telemetry    NSR - Personally Reviewed  ECG      Physical Exam   GEN: elderly WF in no acute distress.   Neck: JVP isnormla   Cardiac: RRR, no murmurs, rubs, or gallops.  Respiratory: Clear to auscultation bilaterally. GI: Soft, nontender, non-distended  MS: No edema; No deformity. Neuro:  dementia at baseline  Psych: Normal affect   Labs    Chemistry Recent Labs  Lab 01/02/19 1400 01/03/19 0859 01/04/19 0327  NA 137 137 142  K 4.0 2.9* 3.9  CL 100 99 103  CO2 23 27 32  GLUCOSE 116* 124* 116*  BUN 14 16 15   CREATININE  1.42* 0.97 0.64  CALCIUM 9.0 8.6* 9.0  GFRNONAA 35* 56* >60  GFRAA 41* >60 >60  ANIONGAP 14 11 7      Hematology Recent Labs  Lab 01/02/19 1400 01/03/19 0256 01/04/19 0327  WBC 11.6* 8.4 6.6  RBC 4.45 4.02 4.12  HGB 13.8 12.6 12.7  HCT 43.6 38.8 41.2  MCV 98.0 96.5 100.0  MCH 31.0 31.3 30.8  MCHC 31.7 32.5 30.8  RDW 13.4 13.2 13.2  PLT 249 197 212    Cardiac Enzymes Recent Labs  Lab 01/02/19 1400 01/02/19 1924 01/03/19 0256  TROPONINI 0.21* 0.13* 0.11*    Recent Labs  Lab 01/02/19 1412  TROPIPOC 0.19*     BNP Recent Labs  Lab 01/02/19 1400 01/03/19 0859  BNP 518.5* 209.8*     DDimer No results for input(s): DDIMER in the last 168 hours.   Radiology    No results found.  Cardiac Studies   2D Echo 01/03/19 IMPRESSIONS    1. The left ventricle has normal systolic function, with an ejection fraction of 55-60%. The cavity size was normal. Left ventricular diastolic Doppler parameters are indeterminate No evidence of  left ventricular regional wall motion abnormalities.  2. No evidence of left ventricular regional wall motion abnormalities.  3. The right ventricle has normal systolic function. The cavity was normal. There is no increase in right ventricular wall thickness. Right ventricular systolic pressure could not be assessed.  4. The mitral valve is normal in structure. There is mild mitral annular calcification present.  5. The tricuspid valve is normal in structure.  6. The aortic valve is tricuspid Mild thickening of the aortic valve Mild calcification of the aortic valve.  7. Right atrial pressure is estimated at 3 mmHg.  FINDINGS  Left Ventricle: The left ventricle has normal systolic function, with an ejection fraction of 55-60%. The cavity size was normal. There is no increase in left ventricular wall thickness. Left ventricular diastolic Doppler parameters are indeterminate No  evidence of left ventricular regional wall motion  abnormalities.. Right Ventricle: The right ventricle has normal systolic function. The cavity was normal. There is no increase in right ventricular wall thickness. Right ventricular systolic pressure could not be assessed.  Patient Profile     78 y.o. female w/ h/o HTN, dementia and falls, who presented w/ a mechanical fall w/ subsequent left pubic ramus fx. Per ortho, plan is for non operative management. Cardiology was consulted 2/17 for CHF and elevated Troponin   Assessment & Plan    1. HFpEF: initial BNP was elevated at 518.5 on 2/17. Lasix given and repeat BNP yesterday down to 209.8  Echo with normal LVEF, 55-60%. Left ventricular diastolic  Volume looks pretty good   I think dyspnea may be related to severely elevated  BP  WIll increase meds     2. Elevated Troponin: troponin trend, 0.21>>0.13>>0.11. She denies CP, but history may be unreliable given her dementia. Per consult note, her husband reported that she endorsed recent CP but he is not present.  Her echo is reassuring. She has normal LVEF and no WMAs and no significant valvular abnormalities. Given age and dementia, no plans for further ischemic w/u at this time. Medical management. Continue ASA,  blocker and ACEi.   3. Pubic Ramus Fx: 2/2 mechanical fall. Per ortho, plan is for non operative management.  Pt has not been up moving    4 HTN: . BP lablie but elevated  WIll add 2.5 mg amlodipine to regimen  FOllow   .   Marland Kitchen   For questions or updates, please contact [place] Please consult [email or web address] for contact info under        Signed, Dorris Carnes, MD  01/05/2019, 6:24 AM

## 2019-01-05 NOTE — Care Management Note (Signed)
Case Management Note  Patient Details  Name: [name] MRN: [id number] Date of Birth: January 11, 1941  Subjective/Objective: Pt presented for Nstemi-hip pain s/p fall. Pt has family support spouse and son. Plan for SNF once stable to transition. CSW following for transition of care needs.                   Action/Plan: CM will continue to monitor for additional transition of care needs.   Expected Discharge Date:                  Expected Discharge Plan:  Skilled Nursing Facility  In-House Referral:  Clinical Social Work  Discharge planning Services  CM Consult  Post Acute Care Choice:  NA Choice offered to:  NA  DME Arranged:  N/A DME Agency:  NA  HH Arranged:  NA HH Agency:  NA  Status of Service:  Completed, signed off  If discussed at [place] of Stay Meetings, dates discussed:    Additional Comments:  Bethena Roys, RN 01/05/2019, 12:52 PM

## 2019-01-05 NOTE — Care Management Important Message (Signed)
Important Message  Patient Details  Name: [name] MRN: [id number] Date of Birth: 05-Apr-1941   Medicare Important Message Given:  Yes    Barb Merino Makoti 01/05/2019, 12:05 PM

## 2019-01-05 NOTE — Social Work (Signed)
Discussed SNF bed offers with patient, spouse, and son at bedside. Their first choices, [facility], will not offer a bed.   They have now chosen [facility] in [place]. [facility] has offered a bed and started patient's [facility] authorization. Patient can admit to the facility once [facility] obtained. CSW to follow.  Estanislado Emms, LCSW [phone]

## 2019-01-06 MED ORDER — ALBUTEROL SULFATE (2.5 MG/3ML) 0.083% IN NEBU: 2.5000 mg | INHALATION_SOLUTION | RESPIRATORY_TRACT | 12 refills | Status: DC | PRN

## 2019-01-06 MED ORDER — KETOROLAC TROMETHAMINE 10 MG PO TABS: 10.0000 mg | ORAL_TABLET | Freq: Three times a day (TID) | ORAL | 0 refills | Status: DC | PRN

## 2019-01-06 MED ORDER — AMLODIPINE BESYLATE 5 MG PO TABS: 5.0000 mg | ORAL_TABLET | Freq: Every day | ORAL | 0 refills | Status: DC

## 2019-01-06 MED ORDER — NITROGLYCERIN 0.4 MG SL SUBL: 0.4000 mg | SUBLINGUAL_TABLET | SUBLINGUAL | 0 refills | Status: DC | PRN

## 2019-01-06 MED ORDER — METOPROLOL TARTRATE 50 MG PO TABS: 50.0000 mg | ORAL_TABLET | Freq: Two times a day (BID) | ORAL | 0 refills | Status: DC

## 2019-01-06 MED ORDER — ASPIRIN 81 MG PO TBEC: 81.0000 mg | DELAYED_RELEASE_TABLET | Freq: Every day | ORAL | 0 refills | Status: DC

## 2019-01-06 MED ORDER — AMLODIPINE BESYLATE 5 MG PO TABS
5.0000 mg | ORAL_TABLET | Freq: Every day | ORAL | Status: DC
  Filled 2019-01-06: qty 1

## 2019-01-06 NOTE — Social Work (Signed)
Patient will discharge to [facility] in [place] Anticipated discharge date: 01/06/2019 Family notified: Basilia Jumbo, son Transportation by: family will drive   Nurse to call report to [phone]  CSW signing off.  Estanislado Emms, [place]  Clinical Social Worker

## 2019-01-06 NOTE — Progress Notes (Signed)
Physical Therapy Treatment Patient Details Name: [name] MRN: [id number] DOB: 02-Dec-1940 Today's Date: 01/06/2019    History of Present Illness Pt is a 78 y.o. female admitted 01/02/19 with hip pain following a fall 2 days prior. Found to have displaced L superior pubic ramus fx; plan for nonoperative management (WBAT). S/p TEE 2/18. PMH includes dementia, HTN, frequent falls, R femur ORIF (01/2018), THA, TKA.   PT Comments    Pt slowly progressing with mobility. C/o dizziness upon sitting with noted high BP (see values below; RN aware). Prolonged sitting EOB with LE therex. Requires modA to stand and take steps with RW. Pt limited by c/o lower back pain and fear of falling. Max encouragement to stay sitting in chair for at least one hour as tolerated. Further mobility limited by BP. SpO2 99% on RA  BPs Sitting 168/115  Sitting after 4 min 160/128  Post-transfer to recliner 181/144      Follow Up Recommendations  SNF;Supervision for mobility/OOB     Equipment Recommendations  None recommended by PT    Recommendations for Other Services       Precautions / Restrictions Precautions Precautions: Fall Restrictions Weight Bearing Restrictions: Yes LLE Weight Bearing: Weight bearing as tolerated    Mobility  Bed Mobility Overal bed mobility: Needs Assistance Bed Mobility: Supine to Sit     Supine to sit: Min guard;HOB elevated     General bed mobility comments: Significant increased time and effort, heavy use of bed rail to pull to EOB; cues for sequencing. C/o dizziness upon sitting  Transfers Overall transfer level: Needs assistance Equipment used: Rolling walker (2 wheeled) Transfers: Sit to/from Stand Sit to Stand: Mod assist         General transfer comment: ModA to assist trunk elevation to RW; cues for hand placement. Pt stood 3x during session from bed and recliner; max cues to achieve fully upright posture, pt limited by c/o back  pain  Ambulation/Gait Ambulation/Gait assistance: Min assist Gait Distance (Feet): 2 Feet Assistive device: Rolling walker (2 wheeled) Gait Pattern/deviations: Step-to pattern;Decreased weight shift to left;Decreased dorsiflexion - right;Antalgic;Trunk flexed Gait velocity: Decreased Gait velocity interpretation: <1.31 ft/sec, indicative of household ambulator General Gait Details: Took pivotal steps from bed to recliner with RW and minA; significant increased time to navigate RW, requiring assist at times to move it. Fearful of falling. Further distance limited by high BP and symptomatic (RN notified)   Stairs             Wheelchair Mobility    Modified Rankin (Stroke Patients Only)       Balance Overall balance assessment: Needs assistance   Sitting balance-Leahy Scale: Fair       Standing balance-Leahy Scale: Poor Standing balance comment: Reliant on UE support                            Cognition Arousal/Alertness: Awake/alert Behavior During Therapy: Flat affect Overall Cognitive Status: History of cognitive impairments - at baseline                                 General Comments: Intermittently confused with moments of blank stare in response to question, althoguh providing appropriate answers. Follows commands with increased time.       Exercises General Exercises - Lower Extremity Long Arc Quad: AROM;Left;Seated    General Comments General comments (skin integrity,  edema, etc.): High BP. SpO2 99% on RA      Pertinent Vitals/Pain Pain Assessment: Faces Faces Pain Scale: Hurts little more Pain Location: Lower back Pain Descriptors / Indicators: Guarding;Grimacing;Sore Pain Intervention(s): Limited activity within patient's tolerance;Repositioned    Home Living                      Prior Function            PT Goals (current goals can now be found in the care plan section) Acute Rehab PT Goals Patient  Stated Goal: Agreeable to rehab at SNF before returning home PT Goal Formulation: With patient Time For Goal Achievement: 01/18/19 Potential to Achieve Goals: Good Progress towards PT goals: Progressing toward goals    Frequency    Min 3X/week      PT Plan Current plan remains appropriate    Co-evaluation              AM-PAC PT "6 Clicks" Mobility   Outcome Measure  Help needed turning from your back to your side while in a flat bed without using bedrails?: A Little Help needed moving from lying on your back to sitting on the side of a flat bed without using bedrails?: A Little Help needed moving to and from a bed to a chair (including a wheelchair)?: A Lot Help needed standing up from a chair using your arms (e.g., wheelchair or bedside chair)?: A Little Help needed to walk in hospital room?: A Lot Help needed climbing 3-5 steps with a railing? : A Lot 6 Click Score: 15    End of Session Equipment Utilized During Treatment: Gait belt Activity Tolerance: Treatment limited secondary to medical complications (Comment);Patient limited by pain(dizziness, high BP) Patient left: in chair;with call bell/phone within reach;with chair alarm set Nurse Communication: Mobility status PT Visit Diagnosis: Other abnormalities of gait and mobility (R26.89);Pain Pain - part of body: (Lower back)     Time: 0932-6712 PT Time Calculation (min) (ACUTE ONLY): 24 min  Charges:  $Therapeutic Exercise: 8-22 mins $Therapeutic Activity: 8-22 mins                    Mabeline Caras, PT, DPT Acute Rehabilitation Services  Pager [phone] Office [place] 01/06/2019, 9:08 AM

## 2019-01-06 NOTE — Clinical Social Work Placement (Signed)
   CLINICAL SOCIAL WORK PLACEMENT  NOTE  Date:  01/06/2019  Patient Details  Name: [name] MRN: [id number] Date of Birth: 01/03/1941  Clinical Social Work is seeking post-discharge placement for this patient at the [place] level of care (*CSW will initial, date and re-position this form in  chart as items are completed):  Yes   Patient/family provided with [facility] Work Department's list of facilities offering this level of care within the geographic area requested by the patient (or if unable, by the patient's family).  Yes   Patient/family informed of their freedom to choose among providers that offer the needed level of care, that participate in Medicare, Medicaid or managed care program needed by the patient, have an available bed and are willing to accept the patient.  Yes   Patient/family informed of [facility]'s ownership interest in [facility] and [facility], as well as of the fact that they are under no obligation to receive care at these facilities.  PASRR submitted to EDS on       PASRR number received on       Existing PASRR number confirmed on 01/04/19     FL2 transmitted to all facilities in geographic area requested by pt/family on 01/04/19     FL2 transmitted to all facilities within larger geographic area on       Patient informed that his/her managed care company has contracts with or will negotiate with certain facilities, including the following:  Other - please specify in the comment section below:([facility])         Patient/family informed of bed offers received.  Patient chooses bed at Other - please specify in the comment section below:([facility])     Physician recommends and patient chooses bed at      Patient to be transferred to Other - please specify in the comment section below:([facility]) on  .  Patient to be  transferred to facility by family will drive     Patient family notified on 01/06/19 of transfer.  Name of family member notified:  Basilia Jumbo and [name]     PHYSICIAN       Additional Comment:    _______________________________________________ Estanislado Emms, LCSW 01/06/2019, 3:08 PM

## 2019-01-06 NOTE — Progress Notes (Signed)
Progress Note  Patient Name: [name] Date of Encounter: 01/06/2019  Primary Cardiologist: New to [facility] (Dr. Harrington Challenger)  Subjective   No SOB  No CP   Back hurts   Head a little funny    Inpatient Medications    Scheduled Meds: . [START ON 01/07/2019] amLODipine  5 mg Oral Daily  . aspirin EC  81 mg Oral Daily  . benazepril  40 mg Oral Daily  . enoxaparin (LOVENOX) injection  40 mg Subcutaneous Q24H  . feeding supplement (ENSURE ENLIVE)  237 mL Oral TID BM  . metoprolol tartrate  50 mg Oral BID  . mirtazapine  15 mg Oral QHS   Continuous Infusions:  PRN Meds: acetaminophen, albuterol, fentaNYL (SUBLIMAZE) injection, ketorolac, nitroGLYCERIN, ondansetron (ZOFRAN) IV   Vital Signs    Vitals:   01/06/19 0616 01/06/19 0619 01/06/19 0922 01/06/19 0927  BP:   (!) 143/103 (!) 143/102  Pulse:   91 88  Resp:    (!) 29  Temp: 98.3 F (36.8 C)     TempSrc: Oral     SpO2:    97%  Weight:  57.8 kg    Height:        Intake/Output Summary (Last 24 hours) at 01/06/2019 0953 Last data filed at 01/05/2019 2046 Gross per 24 hour  Intake 580 ml  Output 400 ml  Net 180 ml   Last 3 Weights 01/06/2019 01/05/2019 01/04/2019  Weight (lbs) 127 lb 6.8 oz 130 lb 15.3 oz 131 lb 6.3 oz  Weight (kg) 57.8 kg 59.4 kg 59.6 kg      Telemetry    NSR - Personally Reviewed  ECG      Physical Exam   GEN: elderly WF in no acute distress.   Neck: JVP not elevated    Cardiac: RRR, no murmurs, rubs, or gallops.  Respiratory: Clear to auscultation bilaterally. GI: Soft, nontender, non-distended  MS: No edema; No deformity. Neuro:  dementia at baseline  Psych: Normal affect   Labs    Chemistry Recent Labs  Lab 01/02/19 1400 01/03/19 0859 01/04/19 0327  NA 137 137 142  K 4.0 2.9* 3.9  CL 100 99 103  CO2 23 27 32  GLUCOSE 116* 124* 116*  BUN 14 16 15   CREATININE 1.42* 0.97 0.64  CALCIUM 9.0 8.6* 9.0  GFRNONAA 35* 56* >60  GFRAA 41* >60 >60  ANIONGAP 14 11 7       Hematology Recent Labs  Lab 01/02/19 1400 01/03/19 0256 01/04/19 0327  WBC 11.6* 8.4 6.6  RBC 4.45 4.02 4.12  HGB 13.8 12.6 12.7  HCT 43.6 38.8 41.2  MCV 98.0 96.5 100.0  MCH 31.0 31.3 30.8  MCHC 31.7 32.5 30.8  RDW 13.4 13.2 13.2  PLT 249 197 212    Cardiac Enzymes Recent Labs  Lab 01/02/19 1400 01/02/19 1924 01/03/19 0256  TROPONINI 0.21* 0.13* 0.11*    Recent Labs  Lab 01/02/19 1412  TROPIPOC 0.19*     BNP Recent Labs  Lab 01/02/19 1400 01/03/19 0859  BNP 518.5* 209.8*     DDimer No results for input(s): DDIMER in the last 168 hours.   Radiology    No results found.  Cardiac Studies   2D Echo 01/03/19 IMPRESSIONS    1. The left ventricle has normal systolic function, with an ejection fraction of 55-60%. The cavity size was normal. Left ventricular diastolic Doppler parameters are indeterminate No evidence of left ventricular regional wall motion abnormalities.  2. No evidence  of left ventricular regional wall motion abnormalities.  3. The right ventricle has normal systolic function. The cavity was normal. There is no increase in right ventricular wall thickness. Right ventricular systolic pressure could not be assessed.  4. The mitral valve is normal in structure. There is mild mitral annular calcification present.  5. The tricuspid valve is normal in structure.  6. The aortic valve is tricuspid Mild thickening of the aortic valve Mild calcification of the aortic valve.  7. Right atrial pressure is estimated at 3 mmHg.  FINDINGS  Left Ventricle: The left ventricle has normal systolic function, with an ejection fraction of 55-60%. The cavity size was normal. There is no increase in left ventricular wall thickness. Left ventricular diastolic Doppler parameters are indeterminate No  evidence of left ventricular regional wall motion abnormalities.. Right Ventricle: The right ventricle has normal systolic function. The cavity was normal. There is no  increase in right ventricular wall thickness. Right ventricular systolic pressure could not be assessed.  Patient Profile     78 y.o. female w/ h/o HTN, dementia and falls, who presented w/ a mechanical fall w/ subsequent left pubic ramus fx. Per ortho, plan is for non operative management. Cardiology was consulted 2/17 for CHF and elevated Troponin   Assessment & Plan    1. HFpEF: initial BNP was elevated at 518.5 on 2/17. Lasix given and repeat BNP yesterday down to 209.8  Echo with normal LVEF, 55-60%. Left ventricular diastolic  Volume looks pretty good   I  Volume looks OK     2. Elevated Troponin: troponin trend, 0.21>>0.13>>0.11 NO pain since admit   Echo shows LVEF normal     I would follow with medical Rx  WOuld not pursue ischemic testing unless she has CP or worsening SOB (when BP controlled)    3 HTN: . WIll increase amlodipine to 5 mg  For a little better control    WIll need to be followed as outpt  .  OK to d/c from cardiac standpoint   WIll need to be followed for BP control as outpt   WIll defer to primary MD   .   For questions or updates, please contact [place] Please consult [email or web address] for contact info under        Signed, Dorris Carnes, MD  01/06/2019, 9:53 AM

## 2019-01-06 NOTE — Discharge Summary (Signed)
Physician Discharge Summary  Patient ID: [name] MRN: [id number] DOB/AGE: 02/21/1941 78 y.o.  Admit date: 01/02/2019 Discharge date: 01/06/2019  Admission Diagnoses:  Discharge Diagnoses:  Principal Problem:   Left superior pubic ramus fracture    Elevated troponin, likely type II elevation.   Pulmonary edema with preserved ejection fraction   Hypertension   [facility])    Discharged Condition: stable  Hospital Course: Patient is an 78 year old Caucasian female, with past medical history significant for hypertension, dementia, and frequent falls.  Patient presented with hip pain following a fall 2 days prior to admission.  In the ED, patient was noted to have a moderately displaced fracture of the left superior pubic ramus, minimally elevated troponin, CT angio was negative for pulmonary embolus but noted possible interstitial edema.  Patient was admitted for further assessment and management.  Cardiology and orthopedic teams were consulted.  Troponin was trended, but noted to be decreasing.  Echocardiogram is noted.  Orthopedic team advised conservative management of the pubic ramus fracture.  Patient's pain has been optimized.  Patient will be discharged to skilled nursing facility for subacute rehabilitation.  Left superior pubic ramus fracture: -Conservative management.   -Orthopedic input is appreciated.   -Pain control was optimized.     -Patient be discharged to skilled nursing facility for subacute rehabilitation.   -Patient will follow with the orthopedic team on discharge.    Very minutely elevated troponin: Likely type II elevation.   Cardiology team directed care.   Echocardiogram revealed preserved EF.    Hypokalemia Continue to monitor and replete.  Acute renal injury: AKI has resolved.    Hypertensive urgency:  BP control has improved significantly.   Continue to monitor and adjust medications accordingly.    Dementia: No behavioral  problems.  Consults: cardiology and orthopedic surgery  Significant Diagnostic Studies:  X-ray of the hip revealed "Moderately displaced fracture seen involving the left superior pubic Ramus".   Echocardiogram revealed:  1. The left ventricle has normal systolic function, with an ejection fraction of 55-60%. The cavity size was normal. Left ventricular diastolic Doppler parameters are indeterminate No evidence of left ventricular regional wall motion abnormalities.  2. No evidence of left ventricular regional wall motion abnormalities.  3. The right ventricle has normal systolic function. The cavity was normal. There is no increase in right ventricular wall thickness. Right ventricular systolic pressure could not be assessed.  4. The mitral valve is normal in structure. There is mild mitral annular calcification present.  5. The tricuspid valve is normal in structure.  6. The aortic valve is tricuspid Mild thickening of the aortic valve Mild calcification of the aortic valve.  7. Right atrial pressure is estimated at 3 mmHg.   Discharge Exam: Blood pressure (!) 150/80, pulse 84, temperature 98.4 F (36.9 C), temperature source Oral, resp. rate (!) 22, height 5' 4.75" (1.645 m), weight 57.8 kg, SpO2 95 %.  Disposition: Discharge disposition: 03-Skilled Nursing Facility  Discharge Instructions    Diet - low sodium heart healthy   Complete by:  As directed    Increase activity slowly   Complete by:  As directed      Allergies as of 01/06/2019      Reactions   Codeine Anaphylaxis      Medication List    STOP taking these medications   celecoxib 100 MG capsule Commonly known as:  CELEBREX   docusate sodium 100 MG capsule Commonly known as:  COLACE   multivitamin with minerals  Tabs tablet   senna-docusate 8.6-50 MG tablet Commonly known as:  Senokot-S   traMADol 50 MG tablet Commonly known as:  ULTRAM     TAKE these medications   albuterol (2.5 MG/3ML) 0.083%  nebulizer solution Commonly known as:  PROVENTIL Take 3 mLs (2.5 mg total) by nebulization every 2 (two) hours as needed for wheezing.   amLODipine 5 MG tablet Commonly known as:  NORVASC Take 1 tablet (5 mg total) by mouth daily. Start taking on:  January 07, 2019   aspirin 81 MG EC tablet Take 1 tablet (81 mg total) by mouth daily. Start taking on:  January 07, 2019   benazepril 40 MG tablet Commonly known as:  LOTENSIN Take 40 mg by mouth daily.   enoxaparin 40 MG/0.4ML injection Commonly known as:  LOVENOX Inject 0.4 mLs (40 mg total) into the skin daily.   feeding supplement (ENSURE ENLIVE) Liqd Take 237 mLs by mouth 3 (three) times daily between meals.   ketorolac 10 MG tablet Commonly known as:  TORADOL Take 1 tablet (10 mg total) by mouth every 8 (eight) hours as needed for moderate pain.   metoprolol tartrate 50 MG tablet Commonly known as:  LOPRESSOR Take 1 tablet (50 mg total) by mouth 2 (two) times daily.   mirtazapine 15 MG tablet Commonly known as:  REMERON Take 15 mg by mouth at bedtime.   nitroGLYCERIN 0.4 MG SL tablet Commonly known as:  NITROSTAT Place 1 tablet (0.4 mg total) under the tongue every 5 (five) minutes x 3 doses as needed for chest pain.      Contact information for after-discharge care    [facility] .   Service:  Skilled Nursing Contact information: [place] [phone]              Signed: Bonnell Public 01/06/2019, 2:25 PM

## 2019-10-23 ENCOUNTER — Other Ambulatory Visit: Payer: Self-pay | Admitting: Internal Medicine

## 2019-10-23 DIAGNOSIS — Z1231 Encounter for screening mammogram for malignant neoplasm of breast: Secondary | ICD-10-CM

## 2019-12-13 ENCOUNTER — Ambulatory Visit: Payer: Medicare Other

## 2020-01-16 ENCOUNTER — Ambulatory Visit: Payer: Medicare Other

## 2021-02-20 DIAGNOSIS — I700 Atherosclerosis of aorta: Secondary | ICD-10-CM | POA: Diagnosis not present

## 2021-02-20 DIAGNOSIS — D510 Vitamin B12 deficiency anemia due to intrinsic factor deficiency: Secondary | ICD-10-CM | POA: Diagnosis not present

## 2021-02-20 DIAGNOSIS — Z853 Personal history of malignant neoplasm of breast: Secondary | ICD-10-CM | POA: Diagnosis not present

## 2021-02-20 DIAGNOSIS — M810 Age-related osteoporosis without current pathological fracture: Secondary | ICD-10-CM | POA: Diagnosis not present

## 2021-02-20 DIAGNOSIS — G4700 Insomnia, unspecified: Secondary | ICD-10-CM | POA: Diagnosis not present

## 2021-02-20 DIAGNOSIS — I10 Essential (primary) hypertension: Secondary | ICD-10-CM | POA: Diagnosis not present

## 2021-03-18 DIAGNOSIS — D510 Vitamin B12 deficiency anemia due to intrinsic factor deficiency: Secondary | ICD-10-CM | POA: Diagnosis not present

## 2021-04-22 DIAGNOSIS — D510 Vitamin B12 deficiency anemia due to intrinsic factor deficiency: Secondary | ICD-10-CM | POA: Diagnosis not present

## 2021-05-20 DIAGNOSIS — E538 Deficiency of other specified B group vitamins: Secondary | ICD-10-CM | POA: Diagnosis not present

## 2021-06-17 DIAGNOSIS — E538 Deficiency of other specified B group vitamins: Secondary | ICD-10-CM | POA: Diagnosis not present

## 2021-07-22 DIAGNOSIS — E538 Deficiency of other specified B group vitamins: Secondary | ICD-10-CM | POA: Diagnosis not present

## 2021-08-22 DIAGNOSIS — Z23 Encounter for immunization: Secondary | ICD-10-CM | POA: Diagnosis not present

## 2021-08-22 DIAGNOSIS — E538 Deficiency of other specified B group vitamins: Secondary | ICD-10-CM | POA: Diagnosis not present

## 2021-09-22 DIAGNOSIS — D510 Vitamin B12 deficiency anemia due to intrinsic factor deficiency: Secondary | ICD-10-CM | POA: Diagnosis not present

## 2022-04-23 ENCOUNTER — Encounter (HOSPITAL_COMMUNITY): Payer: Self-pay | Admitting: Emergency Medicine

## 2022-04-23 ENCOUNTER — Other Ambulatory Visit: Payer: Self-pay

## 2022-04-23 ENCOUNTER — Emergency Department (HOSPITAL_COMMUNITY): Payer: Medicare Other

## 2022-04-23 ENCOUNTER — Inpatient Hospital Stay (HOSPITAL_COMMUNITY)
Admission: EM | Admit: 2022-04-23 | Discharge: 2022-04-27 | DRG: 190 | Disposition: A | Discharge status: Home Health | Payer: Medicare Other | Source: Ambulatory Visit | Attending: Internal Medicine | Admitting: Internal Medicine

## 2022-04-23 DIAGNOSIS — Z8711 Personal history of peptic ulcer disease: Secondary | ICD-10-CM

## 2022-04-23 DIAGNOSIS — Z8249 Family history of ischemic heart disease and other diseases of the circulatory system: Secondary | ICD-10-CM | POA: Diagnosis not present

## 2022-04-23 DIAGNOSIS — I5033 Acute on chronic diastolic (congestive) heart failure: Secondary | ICD-10-CM | POA: Diagnosis not present

## 2022-04-23 DIAGNOSIS — I161 Hypertensive emergency: Secondary | ICD-10-CM | POA: Diagnosis present

## 2022-04-23 DIAGNOSIS — I5021 Acute systolic (congestive) heart failure: Secondary | ICD-10-CM | POA: Diagnosis not present

## 2022-04-23 DIAGNOSIS — R0603 Acute respiratory distress: Secondary | ICD-10-CM | POA: Diagnosis not present

## 2022-04-23 DIAGNOSIS — E860 Dehydration: Secondary | ICD-10-CM | POA: Diagnosis not present

## 2022-04-23 DIAGNOSIS — I509 Heart failure, unspecified: Secondary | ICD-10-CM | POA: Diagnosis not present

## 2022-04-23 DIAGNOSIS — N179 Acute kidney failure, unspecified: Secondary | ICD-10-CM | POA: Diagnosis not present

## 2022-04-23 DIAGNOSIS — I160 Hypertensive urgency: Secondary | ICD-10-CM | POA: Diagnosis present

## 2022-04-23 DIAGNOSIS — R0602 Shortness of breath: Secondary | ICD-10-CM | POA: Diagnosis not present

## 2022-04-23 DIAGNOSIS — F0390 Unspecified dementia without behavioral disturbance: Secondary | ICD-10-CM | POA: Diagnosis present

## 2022-04-23 DIAGNOSIS — J449 Chronic obstructive pulmonary disease, unspecified: Secondary | ICD-10-CM | POA: Diagnosis not present

## 2022-04-23 DIAGNOSIS — R079 Chest pain, unspecified: Secondary | ICD-10-CM | POA: Diagnosis not present

## 2022-04-23 DIAGNOSIS — J9811 Atelectasis: Secondary | ICD-10-CM | POA: Diagnosis not present

## 2022-04-23 DIAGNOSIS — R030 Elevated blood-pressure reading, without diagnosis of hypertension: Secondary | ICD-10-CM | POA: Diagnosis not present

## 2022-04-23 DIAGNOSIS — J441 Chronic obstructive pulmonary disease with (acute) exacerbation: Secondary | ICD-10-CM | POA: Diagnosis not present

## 2022-04-23 DIAGNOSIS — Z79899 Other long term (current) drug therapy: Secondary | ICD-10-CM | POA: Diagnosis not present

## 2022-04-23 DIAGNOSIS — Z743 Need for continuous supervision: Secondary | ICD-10-CM | POA: Diagnosis not present

## 2022-04-23 DIAGNOSIS — R609 Edema, unspecified: Secondary | ICD-10-CM | POA: Diagnosis not present

## 2022-04-23 DIAGNOSIS — R0789 Other chest pain: Secondary | ICD-10-CM | POA: Diagnosis not present

## 2022-04-23 DIAGNOSIS — K449 Diaphragmatic hernia without obstruction or gangrene: Secondary | ICD-10-CM

## 2022-04-23 DIAGNOSIS — E872 Acidosis, unspecified: Secondary | ICD-10-CM | POA: Diagnosis not present

## 2022-04-23 DIAGNOSIS — I110 Hypertensive heart disease with heart failure: Secondary | ICD-10-CM | POA: Diagnosis present

## 2022-04-23 DIAGNOSIS — T501X5A Adverse effect of loop [high-ceiling] diuretics, initial encounter: Secondary | ICD-10-CM | POA: Diagnosis not present

## 2022-04-23 DIAGNOSIS — E876 Hypokalemia: Secondary | ICD-10-CM | POA: Diagnosis present

## 2022-04-23 DIAGNOSIS — R0902 Hypoxemia: Secondary | ICD-10-CM | POA: Diagnosis not present

## 2022-04-23 DIAGNOSIS — R6889 Other general symptoms and signs: Secondary | ICD-10-CM | POA: Diagnosis not present

## 2022-04-23 DIAGNOSIS — Z853 Personal history of malignant neoplasm of breast: Secondary | ICD-10-CM

## 2022-04-23 DIAGNOSIS — H579 Unspecified disorder of eye and adnexa: Secondary | ICD-10-CM | POA: Diagnosis not present

## 2022-04-23 LAB — COMPREHENSIVE METABOLIC PANEL
ALT: 8 U/L (ref 0–44)
AST: 23 U/L (ref 15–41)
Albumin: 4.1 g/dL (ref 3.5–5.0)
Alkaline Phosphatase: 70 U/L (ref 38–126)
Anion gap: 10 (ref 5–15)
BUN: 6 mg/dL — ABNORMAL LOW (ref 8–23)
CO2: 26 mmol/L (ref 22–32)
Calcium: 8.8 mg/dL — ABNORMAL LOW (ref 8.9–10.3)
Chloride: 104 mmol/L (ref 98–111)
Creatinine, Ser: 0.67 mg/dL (ref 0.44–1.00)
GFR, Estimated: >60 mL/min (ref >60)
Glucose, Bld: 104 mg/dL — ABNORMAL HIGH (ref 70–99)
Potassium: 3.7 mmol/L (ref 3.5–5.1)
Sodium: 140 mmol/L (ref 135–145)
Total Bilirubin: 1.3 mg/dL — ABNORMAL HIGH (ref 0.3–1.2)
Total Protein: 7.7 g/dL (ref 6.5–8.1)

## 2022-04-23 LAB — CBC WITH DIFFERENTIAL/PLATELET
Abs Immature Granulocytes: 0.03 10*3/uL (ref 0.00–0.07)
Basophils Absolute: 0 10*3/uL (ref 0.0–0.1)
Basophils Relative: 1 %
Eosinophils Absolute: 0.1 10*3/uL (ref 0.0–0.5)
Eosinophils Relative: 1 %
HCT: 45.7 % (ref 36.0–46.0)
Hemoglobin: 14.5 g/dL (ref 12.0–15.0)
Immature Granulocytes: 0 %
Lymphocytes Relative: 13 %
Lymphs Abs: 1.1 10*3/uL (ref 0.7–4.0)
MCH: 31.6 pg (ref 26.0–34.0)
MCHC: 31.7 g/dL (ref 30.0–36.0)
MCV: 99.6 fL (ref 80.0–100.0)
Monocytes Absolute: 0.4 10*3/uL (ref 0.1–1.0)
Monocytes Relative: 4 %
Neutro Abs: 7.1 10*3/uL (ref 1.7–7.7)
Neutrophils Relative %: 81 %
Platelets: 188 10*3/uL (ref 150–400)
RBC: 4.59 MIL/uL (ref 3.87–5.11)
RDW: 12.6 % (ref 11.5–15.5)
WBC: 8.8 10*3/uL (ref 4.0–10.5)
nRBC: 0 % (ref 0.0–0.2)

## 2022-04-23 LAB — TROPONIN I (HIGH SENSITIVITY)
Troponin I (High Sensitivity): 8 ng/L (ref <18)
Troponin I (High Sensitivity): 10 ng/L (ref <18)

## 2022-04-23 LAB — BRAIN NATRIURETIC PEPTIDE: B Natriuretic Peptide: 310.3 pg/mL — ABNORMAL HIGH (ref 0.0–100.0)

## 2022-04-23 LAB — D-DIMER, QUANTITATIVE: D-Dimer, Quant: 0.49 ug/mL-FEU (ref 0.00–0.50)

## 2022-04-23 MED ORDER — FUROSEMIDE 10 MG/ML IJ SOLN
40.0000 mg | Freq: Two times a day (BID) | INTRAMUSCULAR | Status: DC
  Administered 2022-04-24 (×2): 40 mg via INTRAVENOUS
  Filled 2022-04-23 (×2): qty 4

## 2022-04-23 MED ORDER — IPRATROPIUM-ALBUTEROL 0.5-2.5 (3) MG/3ML IN SOLN
3.0000 mL | Freq: Four times a day (QID) | RESPIRATORY_TRACT | Status: DC | PRN
  Administered 2022-04-24: 3 mL via RESPIRATORY_TRACT
  Filled 2022-04-23: qty 3

## 2022-04-23 MED ORDER — ENOXAPARIN SODIUM 40 MG/0.4ML IJ SOSY
40.0000 mg | PREFILLED_SYRINGE | INTRAMUSCULAR | Status: DC
  Administered 2022-04-23 – 2022-04-25 (×3): 40 mg via SUBCUTANEOUS
  Filled 2022-04-23 (×3): qty 0.4

## 2022-04-23 MED ORDER — BENAZEPRIL HCL 40 MG PO TABS
40.0000 mg | ORAL_TABLET | Freq: Every day | ORAL | Status: DC
  Filled 2022-04-23: qty 1

## 2022-04-23 MED ORDER — MUSCLE RUB 10-15 % EX CREA
TOPICAL_CREAM | CUTANEOUS | Status: DC | PRN
  Filled 2022-04-23: qty 85

## 2022-04-23 MED ORDER — METOPROLOL TARTRATE 50 MG PO TABS
50.0000 mg | ORAL_TABLET | Freq: Two times a day (BID) | ORAL | Status: DC
  Administered 2022-04-24 – 2022-04-25 (×2): 50 mg via ORAL
  Filled 2022-04-23 (×3): qty 1

## 2022-04-23 MED ORDER — METOPROLOL TARTRATE 25 MG PO TABS
50.0000 mg | ORAL_TABLET | Freq: Once | ORAL | Status: AC
  Administered 2022-04-23: 50 mg via ORAL
  Filled 2022-04-23: qty 2

## 2022-04-23 MED ORDER — AMLODIPINE BESYLATE 5 MG PO TABS
5.0000 mg | ORAL_TABLET | Freq: Once | ORAL | Status: AC
  Administered 2022-04-23: 5 mg via ORAL
  Filled 2022-04-23: qty 1

## 2022-04-23 MED ORDER — HYDRALAZINE HCL 20 MG/ML IJ SOLN
10.0000 mg | Freq: Four times a day (QID) | INTRAMUSCULAR | Status: DC | PRN
  Administered 2022-04-23 – 2022-04-24 (×2): 10 mg via INTRAVENOUS
  Filled 2022-04-23 (×2): qty 1

## 2022-04-23 MED ORDER — BENAZEPRIL HCL 40 MG PO TABS
40.0000 mg | ORAL_TABLET | Freq: Once | ORAL | Status: DC
  Filled 2022-04-23: qty 1

## 2022-04-23 MED ORDER — FUROSEMIDE 10 MG/ML IJ SOLN
40.0000 mg | Freq: Once | INTRAMUSCULAR | Status: AC
  Administered 2022-04-23: 40 mg via INTRAVENOUS
  Filled 2022-04-23: qty 4

## 2022-04-23 MED ORDER — ACETAMINOPHEN 500 MG PO TABS
1000.0000 mg | ORAL_TABLET | Freq: Three times a day (TID) | ORAL | Status: DC | PRN
  Administered 2022-04-23 – 2022-04-26 (×6): 1000 mg via ORAL
  Filled 2022-04-23 (×6): qty 2

## 2022-04-23 MED ORDER — ALBUTEROL SULFATE (2.5 MG/3ML) 0.083% IN NEBU
5.0000 mg | INHALATION_SOLUTION | Freq: Once | RESPIRATORY_TRACT | Status: AC
  Administered 2022-04-23: 5 mg via RESPIRATORY_TRACT
  Filled 2022-04-23: qty 6

## 2022-04-23 MED ORDER — BENAZEPRIL HCL 20 MG PO TABS
40.0000 mg | ORAL_TABLET | Freq: Once | ORAL | Status: AC
  Administered 2022-04-23: 40 mg via ORAL
  Filled 2022-04-23: qty 2

## 2022-04-23 MED ORDER — AMLODIPINE BESYLATE 5 MG PO TABS
5.0000 mg | ORAL_TABLET | Freq: Every day | ORAL | Status: DC
  Administered 2022-04-24 – 2022-04-25 (×2): 5 mg via ORAL
  Filled 2022-04-23 (×2): qty 1

## 2022-04-23 MED ORDER — LABETALOL HCL 5 MG/ML IV SOLN
20.0000 mg | Freq: Once | INTRAVENOUS | Status: AC
  Administered 2022-04-23: 20 mg via INTRAVENOUS
  Filled 2022-04-23: qty 4

## 2022-04-23 MED ORDER — IBUPROFEN 400 MG PO TABS: 400.0000 mg | ORAL_TABLET | Freq: Once | ORAL | Status: DC | PRN

## 2022-04-23 NOTE — H&P (Signed)
History and Physical    [name]:[id number] DOB: [birth year] DOA: 04/23/2022  PCP: Wenda Low, MD  Patient coming from: Home  Chief Complaint: Shortness of breath  HPI: [name] is a 81 y.o. female with medical history significant of dementia, hypertension, COPD, history of breast cancer, GERD, hiatal hernia presenting to the ED with complaints of shortness of breath, chest pain, orthopnea, and bilateral lower extremity edema.  EMS gave aspirin 324 mg.  In the ED, patient noted to have scattered wheezing on exam and was given albuterol neb treatment.  Noted to be significantly hypertensive with systolic in the 048G and diastolic over 891.  Not febrile, tachycardic, or hypoxic.  Labs notable for no leukocytosis or anemia.  BNP 310.  D-dimer normal.  High-sensitivity troponin negative x2.  Chest x-ray showing a large hiatal hernia and no active cardiopulmonary disease. Patient was given IV labetalol 20 mg and IV Lasix 40 mg.  She was also given her home oral antihypertensives including amlodipine, benazepril, and metoprolol.  Patient reports progressively worsening dyspnea on exertion for several weeks.  She feels short of breath just walking within her house.  Last night while walking in the house she felt very short of breath and had substernal pressure-like chest pain.  Symptoms improved after she used her home albuterol inhaler.  She went to see her PCP earlier today and was sent to the ED for further evaluation.  She is also reporting orthopnea, paroxysmal nocturnal dyspnea, and worsening bilateral lower extremity edema for several days.  She takes 3 medications for high blood pressure but does not remember their names.  Her husband manages her home medications.  She does not think that she has missed any doses of her home antihypertensives except was not able to take them today as she has not been home since morning.  Denies fevers or cough.  She feels better after receiving  medications in the ED, denies any chest pain or shortness of breath at this time.  No other complaints.  Review of Systems:  Review of Systems  All other systems reviewed and are negative.   Past Medical History:  Diagnosis Date   Arthritis    "knees" (01/18/2018)   Breast cancer, left breast ([place])    Chronic bronchitis (HCC)    COPD (chronic obstructive pulmonary disease) (HCC)    GERD (gastroesophageal reflux disease)    Headache    "just about qd" (01/18/2018)   History of hiatal hernia    History of stomach ulcers    Hypertension    Memory difficulty    Migraine    "once/month maybe" (01/18/2018)   Pneumonia    "maybe twice" (01/18/2018)   Thyroid disease     Past Surgical History:  Procedure Laterality Date   ABDOMINAL HYSTERECTOMY     "partial"   APPENDECTOMY     BACK SURGERY     BREAST LUMPECTOMY Left    DILATION AND CURETTAGE OF UTERUS     FEMUR FRACTURE SURGERY Left 22/3006   /notes 04/01/2011   I & D KNEE WITH POLY EXCHANGE Right 12/19/2002   Archie Endo 04/01/2011   JOINT REPLACEMENT     KNEE ARTHROSCOPY Left 09/2001   with popliteal cyst excision and synovectomy /notes 04/01/2011   LAPAROSCOPIC CHOLECYSTECTOMY  11/2004   /notes 04/01/2011   LUMBAR FUSION  05/2001   Archie Endo 04/01/2011   ORIF FEMUR FRACTURE Right 01/19/2018   Procedure: OPEN REDUCTION INTERNAL FIXATION (ORIF) DISTAL FEMUR FRACTURE;  Surgeon: Griffin Basil,  Laretta Alstrom, MD;  Location: [place];  Service: Orthopedics;  Laterality: Right;   TONSILLECTOMY     TOTAL HIP ARTHROPLASTY Bilateral    TOTAL HIP REVISION Left 04/2003   /notes 04/01/2011   TOTAL KNEE ARTHROPLASTY Bilateral 07/2000   Archie Endo 04/01/2011   TOTAL KNEE REVISION Left 08/2003   Archie Endo 04/01/2011   WRIST SURGERY Right    "? break"     reports that she has never smoked. She has never used smokeless tobacco. She reports that she does not drink alcohol and does not use drugs.  Allergies  Allergen Reactions   Codeine Anaphylaxis    Other reaction(s):  Unknown    Family History  Problem Relation Age of Onset   CAD Father        "heart attack" in early 92's.   Atrial fibrillation Son     Prior to Admission medications   Medication Sig Start Date End Date Taking? Authorizing Provider  albuterol (PROVENTIL) (2.5 MG/3ML) 0.083% nebulizer solution Take 3 mLs (2.5 mg total) by nebulization every 2 (two) hours as needed for wheezing. 01/06/19   Dana Allan I, MD  amLODipine (NORVASC) 5 MG tablet Take 1 tablet (5 mg total) by mouth daily. 01/07/19   Dana Allan I, MD  aspirin EC 81 MG EC tablet Take 1 tablet (81 mg total) by mouth daily. 01/07/19   Dana Allan I, MD  benazepril (LOTENSIN) 40 MG tablet Take 40 mg by mouth daily. 12/28/18   [provider]  enoxaparin (LOVENOX) 40 MG/0.4ML injection Inject 0.4 mLs (40 mg total) into the skin daily. Patient not taking: Reported on 01/02/2019 01/20/18 01/20/19  Hiram Gash, MD  feeding supplement, ENSURE ENLIVE, (ENSURE ENLIVE) LIQD Take 237 mLs by mouth 3 (three) times daily between meals. 01/22/18   Hongalgi, Lenis Dickinson, MD  ketorolac (TORADOL) 10 MG tablet Take 1 tablet (10 mg total) by mouth every 8 (eight) hours as needed for moderate pain. 01/06/19   Bonnell Public, MD  metoprolol tartrate (LOPRESSOR) 50 MG tablet Take 1 tablet (50 mg total) by mouth 2 (two) times daily. 01/06/19   Dana Allan I, MD  mirtazapine (REMERON) 15 MG tablet Take 15 mg by mouth at bedtime. 10/02/18   [provider]  nitroGLYCERIN (NITROSTAT) 0.4 MG SL tablet Place 1 tablet (0.4 mg total) under the tongue every 5 (five) minutes x 3 doses as needed for chest pain. 01/06/19   Bonnell Public, MD    Physical Exam: Vitals:   04/23/22 1941 04/23/22 1942 04/23/22 1945 04/23/22 2015  BP: (!) 181/115  (!) 205/148 (!) 195/174  Pulse: 77  80 80  Resp:   (!) 22 (!) 25  Temp:  98.5 F (36.9 C)    TempSrc:  Oral    SpO2:   96% 94%    Physical Exam Vitals reviewed.  Constitutional:       General: She is not in acute distress. HENT:     Head: Normocephalic and atraumatic.  Eyes:     Extraocular Movements: Extraocular movements intact.     Conjunctiva/sclera: Conjunctivae normal.  Neck:     Comments: +JVD Cardiovascular:     Rate and Rhythm: Normal rate and regular rhythm.     Pulses: Normal pulses.  Pulmonary:     Effort: Pulmonary effort is normal. No respiratory distress.     Breath sounds: Normal breath sounds. No wheezing or rales.  Abdominal:     General: Bowel sounds are normal. There is  no distension.     Palpations: Abdomen is soft.     Tenderness: There is no abdominal tenderness.  Musculoskeletal:     Cervical back: Normal range of motion.     Right lower leg: Edema present.     Left lower leg: Edema present.     Comments: 2+ pitting edema of bilateral lower legs  Skin:    General: Skin is warm and dry.  Neurological:     General: No focal deficit present.     Mental Status: She is alert and oriented to person, place, and time.      Labs on Admission: I have personally reviewed following labs and imaging studies  CBC: Recent Labs  Lab 04/23/22 1502  WBC 8.8  NEUTROABS 7.1  HGB 14.5  HCT 45.7  MCV 99.6  PLT 932   Basic Metabolic Panel: Recent Labs  Lab 04/23/22 1502  NA 140  K 3.7  CL 104  CO2 26  GLUCOSE 104*  BUN 6*  CREATININE 0.67  CALCIUM 8.8*   GFR: CrCl cannot be calculated (Unknown ideal weight.). Liver Function Tests: Recent Labs  Lab 04/23/22 1502  AST 23  ALT 8  ALKPHOS 70  BILITOT 1.3*  PROT 7.7  ALBUMIN 4.1   No results for input(s): "LIPASE", "AMYLASE" in the last 168 hours. No results for input(s): "AMMONIA" in the last 168 hours. Coagulation Profile: No results for input(s): "INR", "PROTIME" in the last 168 hours. Cardiac Enzymes: No results for input(s): "CKTOTAL", "CKMB", "CKMBINDEX", "TROPONINI" in the last 168 hours. BNP (last 3 results) No results for input(s): "PROBNP" in the last 8760  hours. HbA1C: No results for input(s): "HGBA1C" in the last 72 hours. CBG: No results for input(s): "GLUCAP" in the last 168 hours. Lipid Profile: No results for input(s): "CHOL", "HDL", "LDLCALC", "TRIG", "CHOLHDL", "LDLDIRECT" in the last 72 hours. Thyroid Function Tests: No results for input(s): "TSH", "T4TOTAL", "FREET4", "T3FREE", "THYROIDAB" in the last 72 hours. Anemia Panel: No results for input(s): "VITAMINB12", "FOLATE", "FERRITIN", "TIBC", "IRON", "RETICCTPCT" in the last 72 hours. Urine analysis:    Component Value Date/Time   COLORURINE YELLOW 01/02/2019 1815   APPEARANCEUR CLEAR 01/02/2019 1815   LABSPEC >1.046 (H) 01/02/2019 1815   PHURINE 5.0 01/02/2019 1815   GLUCOSEU NEGATIVE 01/02/2019 1815   HGBUR SMALL (A) 01/02/2019 1815   BILIRUBINUR NEGATIVE 01/02/2019 1815   KETONESUR 20 (A) 01/02/2019 1815   PROTEINUR NEGATIVE 01/02/2019 1815   NITRITE NEGATIVE 01/02/2019 1815   LEUKOCYTESUR NEGATIVE 01/02/2019 1815    Radiological Exams on Admission: I have personally reviewed images DG Chest 2 View  Result Date: 04/23/2022 CLINICAL DATA:  sob EXAM: CHEST - 2 VIEW COMPARISON:  January 02, 2019 FINDINGS: Mild cardiomegaly. Atheromatous calcifications of the arch of the aorta. Large hiatal hernia extending to the right lung base. Minor atelectasis at the bibasilar regions. There are some interstitial changes seen as well. Osteopenia. There is some deformity seen at the right humeral head. IMPRESSION: No active cardiopulmonary disease.  Large hiatal hernia. Electronically Signed   By: Frazier Richards M.D.   On: 04/23/2022 15:49    EKG: Independently reviewed.  Sinus rhythm, mild ST depressions in lateral leads.  Assessment and Plan  Suspected new onset CHF Echo done in February 2020 showing LVEF 55 to 67%, diastolic parameters indeterminate.  Patient presenting with complaints of progressively worsening dyspnea on exertion, orthopnea, paroxysmal nocturnal dyspnea, and  worsening bilateral lower extremity edema.  Not hypoxic.  BNP elevated at 310.  Chest showing mild cardiomegaly and no pulmonary edema. -Cardiac monitoring -Patient received IV Lasix 40 mg in the ED.  Continue diuresis with IV Lasix 40 mg twice daily starting in the morning. -Monitor renal function -Monitor intake and output -Daily weights -Low-sodium diet with fluid restriction  Hypertensive urgency Blood pressure significantly elevated with systolic in the 970Y and diastolic over 637.  Patient had not taken her home antihypertensives today and was given doses in the ED along with IV labetalol. -Continue amlodipine, benazepril, and metoprolol. -IV hydralazine PRN SBP >160  Acute mild COPD exacerbation Also likely contributing to her symptoms.  Patient had scattered wheezing on initial exam done by ED provider and was given albuterol neb treatment.  Lungs clear on exam at present, no respiratory distress.  Not hypoxic. -Start steroids if wheezing is persistent -DuoNeb as needed  Chest pain Likely multifactorial from conditions listed above.  ACS less likely as high-sensitivity troponin negative x2.  PE less likely as D-dimer is normal.  Currently chest pain-free and appears comfortable. -Cardiac monitoring  Dementia without behavioral disturbance -Delirium precautions  Large hiatal hernia Not endorsing any GERD symptoms at present. -Outpatient follow-up with PCP/general surgery  DVT prophylaxis: Lovenox Code Status: Full Code (discussed with the patient) Family Communication: No family available at this time. Level of care: Progressive Care Unit Admission status: It is my clinical opinion that referral for OBSERVATION is reasonable and necessary in this patient based on the above information provided. The aforementioned taken together are felt to place the patient at high risk for further clinical deterioration. However, it is anticipated that the patient may be medically stable for  discharge from the hospital within 24 to 48 hours.   Shela Leff MD [facility]  If 7PM-7AM, please contact night-coverage [email or web address]  04/23/2022, 8:34 PM

## 2022-04-23 NOTE — ED Provider Triage Note (Signed)
Emergency Medicine Provider Triage Evaluation Note  [name] , a 81 y.o. female  was evaluated in triage.  Pt complains of shortness of breath.  She had chest pain yesterday, this resolved seen by primary earlier today and sent to ED for evaluation for possible CHF.  She is not having lower extremity swelling for 2 weeks worsened yesterday.  Also having dyspnea on exertion..  Review of Systems  Per HPI  Physical Exam  There were no vitals taken for this visit. Gen:   Awake, no distress   Resp:  Normal effort MSK:   Moves extremities without difficulty  Other:  Bilateral trace edema  Medical Decision Making  Medically screening exam initiated at 2:55 PM.  Appropriate orders placed.  [name] was informed that the remainder of the evaluation will be completed by another provider, this initial triage assessment does not replace that evaluation, and the importance of remaining in the ED until their evaluation is complete.     Sherrill Raring, PA-C 04/23/22 1456

## 2022-04-23 NOTE — ED Provider Notes (Signed)
[facility] EMERGENCY DEPARTMENT Provider Note   CSN: [id number] Arrival date & time: 04/23/22  1450     History  Chief Complaint  Patient presents with   Leg Swelling    [name] is a 81 y.o. female.  Patient is an 81 year old female with a history of hypertension, COPD, GERD who is presenting today with worsening shortness of breath and chest pain.  Patient and family members report over the last 3 weeks she has had swelling in her legs that is gradually been worsening.  She reports that she had they would usually swell but by the morning when she would wake up they would be better however in the last few days the swelling has not gone down.  Also she has been noticing worsening shortness of breath over the last month where now she cannot walk more than 5 feet without getting winded.  She reports that the last 2 nights she has not been able to sleep because every time she lays down she feels short of breath and is describing orthopnea.  She denies any fever, cough but did report last night she was having tightness in her chest when she would try to lay down.  She takes no diuretics and does not have a history of CHF.  She denies prior history of PE but does not take any anticoagulation.  She reports she takes her metoprolol, Benzapril and amlodipine at night and last had her blood pressure medications last night.  She was seen by her PCP today and was found to have the symptoms as well as hypertension in the 200s over 100s and was sent here for further evaluation.  Patient reports anytime she does anything she becomes very short of breath but denies any chest pain at this time.  She has had no abdominal pain, nausea or vomiting.  She does use her inhalers at home and felt that it helped a little bit.  Denies tobacco use.  The history is provided by the patient, medical records and a relative.       Home Medications Prior to Admission medications   Medication Sig  Start Date End Date Taking? Authorizing Provider  albuterol (PROVENTIL) (2.5 MG/3ML) 0.083% nebulizer solution Take 3 mLs (2.5 mg total) by nebulization every 2 (two) hours as needed for wheezing. 01/06/19   Dana Allan I, MD  amLODipine (NORVASC) 5 MG tablet Take 1 tablet (5 mg total) by mouth daily. 01/07/19   Dana Allan I, MD  aspirin EC 81 MG EC tablet Take 1 tablet (81 mg total) by mouth daily. 01/07/19   Dana Allan I, MD  benazepril (LOTENSIN) 40 MG tablet Take 40 mg by mouth daily. 12/28/18   [provider]  enoxaparin (LOVENOX) 40 MG/0.4ML injection Inject 0.4 mLs (40 mg total) into the skin daily. Patient not taking: Reported on 01/02/2019 01/20/18 01/20/19  Hiram Gash, MD  feeding supplement, ENSURE ENLIVE, (ENSURE ENLIVE) LIQD Take 237 mLs by mouth 3 (three) times daily between meals. 01/22/18   Hongalgi, Lenis Dickinson, MD  ketorolac (TORADOL) 10 MG tablet Take 1 tablet (10 mg total) by mouth every 8 (eight) hours as needed for moderate pain. 01/06/19   Bonnell Public, MD  metoprolol tartrate (LOPRESSOR) 50 MG tablet Take 1 tablet (50 mg total) by mouth 2 (two) times daily. 01/06/19   Dana Allan I, MD  mirtazapine (REMERON) 15 MG tablet Take 15 mg by mouth at bedtime. 10/02/18   [provider]  nitroGLYCERIN (NITROSTAT) 0.4 MG SL tablet Place 1 tablet (0.4 mg total) under the tongue every 5 (five) minutes x 3 doses as needed for chest pain. 01/06/19   Bonnell Public, MD      Allergies    Codeine    Review of Systems   Review of Systems  Physical Exam Updated Vital Signs BP (!) 195/174   Pulse 80   Temp 98.5 F (36.9 C) (Oral)   Resp (!) 25   SpO2 94%  Physical Exam Vitals and nursing note reviewed.  Constitutional:      General: She is not in acute distress.    Appearance: She is well-developed.  HENT:     Head: Normocephalic and atraumatic.  Eyes:     Conjunctiva/sclera: Conjunctivae normal.     Pupils: Pupils are equal, round,  and reactive to light.  Cardiovascular:     Rate and Rhythm: Normal rate and regular rhythm.     Heart sounds: No murmur heard. Pulmonary:     Effort: Pulmonary effort is normal. Tachypnea present. No respiratory distress.     Breath sounds: Examination of the right-lower field reveals decreased breath sounds. Examination of the left-lower field reveals decreased breath sounds. Decreased breath sounds present. No rales.     Comments: Scant wheezing Abdominal:     General: There is no distension.     Palpations: Abdomen is soft.     Tenderness: There is no abdominal tenderness. There is no guarding or rebound.  Musculoskeletal:        General: No tenderness. Normal range of motion.     Cervical back: Normal range of motion and neck supple.     Right lower leg: Edema present.     Left lower leg: Edema present.     Comments: 2+ edema bilateral lower extremities to the mid shin  Skin:    General: Skin is warm and dry.     Findings: No erythema or rash.  Neurological:     Mental Status: She is alert and oriented to person, place, and time. Mental status is at baseline.  Psychiatric:        Behavior: Behavior normal.     ED Results / Procedures / Treatments   Labs (all labs ordered are listed, but only abnormal results are displayed) Labs Reviewed  BRAIN NATRIURETIC PEPTIDE - Abnormal; Notable for the following components:      Result Value   B Natriuretic Peptide 310.3 (*)    All other components within normal limits  COMPREHENSIVE METABOLIC PANEL - Abnormal; Notable for the following components:   Glucose, Bld 104 (*)    BUN 6 (*)    Calcium 8.8 (*)    Total Bilirubin 1.3 (*)    All other components within normal limits  CBC WITH DIFFERENTIAL/PLATELET  D-DIMER, QUANTITATIVE  TROPONIN I (HIGH SENSITIVITY)  TROPONIN I (HIGH SENSITIVITY)    EKG EKG Interpretation  Date/Time:  Thursday April 23 2022 14:56:23 EDT Ventricular Rate:  94 PR Interval:  142 QRS Duration: 74 QT  Interval:  358 QTC Calculation: 447 R Axis:   38 Text Interpretation: Normal sinus rhythm new Nonspecific ST and T wave abnormality When compared with ECG of 03-Jan-2019 06:00, PREVIOUS ECG IS PRESENT Confirmed by Blanchie Dessert [phone]) on 04/23/2022 6:04:18 PM  Radiology DG Chest 2 View  Result Date: 04/23/2022 CLINICAL DATA:  sob EXAM: CHEST - 2 VIEW COMPARISON:  January 02, 2019 FINDINGS: Mild cardiomegaly. Atheromatous calcifications of the arch of the aorta.  Large hiatal hernia extending to the right lung base. Minor atelectasis at the bibasilar regions. There are some interstitial changes seen as well. Osteopenia. There is some deformity seen at the right humeral head. IMPRESSION: No active cardiopulmonary disease.  Large hiatal hernia. Electronically Signed   By: Frazier Richards M.D.   On: 04/23/2022 15:49    Procedures Procedures    Medications Ordered in ED Medications  labetalol (NORMODYNE) injection 20 mg (20 mg Intravenous Given 04/23/22 1826)  furosemide (LASIX) injection 40 mg (40 mg Intravenous Given 04/23/22 1824)  albuterol (PROVENTIL) (2.5 MG/3ML) 0.083% nebulizer solution 5 mg (5 mg Nebulization Given 04/23/22 1823)  amLODipine (NORVASC) tablet 5 mg (5 mg Oral Given 04/23/22 1941)  metoprolol tartrate (LOPRESSOR) tablet 50 mg (50 mg Oral Given 04/23/22 1941)  benazepril (LOTENSIN) tablet 40 mg (40 mg Oral Given 04/23/22 1957)    ED Course/ Medical Decision Making/ A&P                           Medical Decision Making Amount and/or Complexity of Data Reviewed Labs: ordered.  Risk Prescription drug management. Decision regarding hospitalization.   Pt with multiple medical problems and comorbidities and presenting today with a complaint that caries a high risk for morbidity and mortality.  Presenting today with orthopnea, shortness of breath and chest pain.  Symptoms have been worsening over 3 weeks but severe last night and the night before.  Patient denies any infectious  symptoms and low in suspicion for pneumonia.  She has no abdominal pain and low suspicion for intra-abdominal process such as obstruction, cholecystitis or UTI.  Patient has scant wheezing and could have COPD exacerbation as well as CHF exacerbation.  Last echo was 3 years ago in 2020 with an EF of 55 to 60% at that time and relatively normal echo.  Patient does not take diuretics regularly.  Low risk Wells criteria and D-dimer is pending. I independently interpreted patient's EKG and labs.  EKG does show some new ST depression laterally compared to prior EKGs.  Labs with normal troponin x2, BNP elevated today at 310 from her prior that were in the 100s, CMP and CBC without significant findings. I have independently visualized and interpreted pt's images today.  Without significant cardiomegaly or significant interstitial edema.  Radiology reports a large hiatal hernia.  Patient is also hypertensive here with blood pressure between 194-200s over 100s.  Due to concern for possible COPD exacerbation in addition to CHF exacerbation we will give a round of albuterol, Lasix.  D-dimer is pending.  Suspect patient will need admission for new echo and further diuresis.  8:43 PM Patient's D-dimer is negative.  She did feel better after Lasix and breathing treatment.  Will admit for diuresis.  Initially blood pressure did improve after labetalol.  We will continue to follow and patient given the home dose of her medications.  Based on patient's above findings consulted the hospitalist for admission.  Findings were discussed with the patient and her family and questions were answered.  CRITICAL CARE Performed by: Damare Serano Total critical care time: 30 minutes Critical care time was exclusive of separately billable procedures and treating other patients. Critical care was necessary to treat or prevent imminent or life-threatening deterioration. Critical care was time spent personally by me on the following  activities: development of treatment plan with patient and/or surrogate as well as nursing, discussions with consultants, evaluation of patient's response to treatment, examination of  patient, obtaining history from patient or surrogate, ordering and performing treatments and interventions, ordering and review of laboratory studies, ordering and review of radiographic studies, pulse oximetry and re-evaluation of patient's condition.           Final Clinical Impression(s) / ED Diagnoses Final diagnoses:  Acute congestive heart failure, unspecified heart failure type ([place])  COPD with acute exacerbation [facility])    Rx / DC Orders ED Discharge Orders     None         Blanchie Dessert, MD 04/23/22 2043

## 2022-04-23 NOTE — ED Notes (Signed)
Pt is reporting 10/10 leg cramps - MD Vena Austria and asked for meds - awaiting orders at this time

## 2022-04-23 NOTE — ED Notes (Signed)
Pt's family member asked pt for a wait time. PT family member was told that I cannot give a specific time frame due to unknown time of pt's in the back being discharged. PT family member stated that pt is huffing and puffing, oxygen was offered, Pt declined oxygen at this time. Pt family member explaining that pt is here for heart issues and should go before anyone else in the lobby. I explained to Pt's family member that others in the waiting room are sick as well and it is not up to tech to place pt's in the room. Pt's family member walking off mumbling.

## 2022-04-23 NOTE — ED Triage Notes (Addendum)
Pt went to MD today for recent lower leg swelling. Pt has had exertional SOB and CP that started last night. Pt was seen by EMS last night but did not want to come to the hospital so she went to her primary MD today. Sent here for CHF eval. EMS gave '324mg'$  ASA

## 2022-04-24 ENCOUNTER — Observation Stay (HOSPITAL_COMMUNITY): Payer: Medicare Other

## 2022-04-24 DIAGNOSIS — R0902 Hypoxemia: Secondary | ICD-10-CM | POA: Diagnosis present

## 2022-04-24 DIAGNOSIS — I160 Hypertensive urgency: Secondary | ICD-10-CM

## 2022-04-24 DIAGNOSIS — Z853 Personal history of malignant neoplasm of breast: Secondary | ICD-10-CM | POA: Diagnosis not present

## 2022-04-24 DIAGNOSIS — F0390 Unspecified dementia without behavioral disturbance: Secondary | ICD-10-CM | POA: Diagnosis present

## 2022-04-24 DIAGNOSIS — R079 Chest pain, unspecified: Secondary | ICD-10-CM | POA: Diagnosis not present

## 2022-04-24 DIAGNOSIS — E872 Acidosis, unspecified: Secondary | ICD-10-CM | POA: Diagnosis not present

## 2022-04-24 DIAGNOSIS — Z8249 Family history of ischemic heart disease and other diseases of the circulatory system: Secondary | ICD-10-CM | POA: Diagnosis not present

## 2022-04-24 DIAGNOSIS — E876 Hypokalemia: Secondary | ICD-10-CM | POA: Diagnosis present

## 2022-04-24 DIAGNOSIS — J449 Chronic obstructive pulmonary disease, unspecified: Secondary | ICD-10-CM | POA: Diagnosis not present

## 2022-04-24 DIAGNOSIS — I509 Heart failure, unspecified: Secondary | ICD-10-CM | POA: Diagnosis not present

## 2022-04-24 DIAGNOSIS — J441 Chronic obstructive pulmonary disease with (acute) exacerbation: Secondary | ICD-10-CM | POA: Diagnosis present

## 2022-04-24 DIAGNOSIS — I5033 Acute on chronic diastolic (congestive) heart failure: Secondary | ICD-10-CM | POA: Diagnosis present

## 2022-04-24 DIAGNOSIS — N179 Acute kidney failure, unspecified: Secondary | ICD-10-CM | POA: Diagnosis not present

## 2022-04-24 DIAGNOSIS — R0603 Acute respiratory distress: Secondary | ICD-10-CM | POA: Diagnosis present

## 2022-04-24 DIAGNOSIS — I161 Hypertensive emergency: Secondary | ICD-10-CM | POA: Diagnosis present

## 2022-04-24 DIAGNOSIS — Z79899 Other long term (current) drug therapy: Secondary | ICD-10-CM | POA: Diagnosis not present

## 2022-04-24 DIAGNOSIS — K449 Diaphragmatic hernia without obstruction or gangrene: Secondary | ICD-10-CM | POA: Diagnosis present

## 2022-04-24 DIAGNOSIS — T501X5A Adverse effect of loop [high-ceiling] diuretics, initial encounter: Secondary | ICD-10-CM | POA: Diagnosis not present

## 2022-04-24 DIAGNOSIS — I5021 Acute systolic (congestive) heart failure: Secondary | ICD-10-CM | POA: Diagnosis not present

## 2022-04-24 DIAGNOSIS — I110 Hypertensive heart disease with heart failure: Secondary | ICD-10-CM | POA: Diagnosis present

## 2022-04-24 DIAGNOSIS — Z8711 Personal history of peptic ulcer disease: Secondary | ICD-10-CM | POA: Diagnosis not present

## 2022-04-24 DIAGNOSIS — E860 Dehydration: Secondary | ICD-10-CM | POA: Diagnosis present

## 2022-04-24 LAB — ECHOCARDIOGRAM COMPLETE
AR max vel: 2.17 cm2
AV Area VTI: 2.27 cm2
AV Area mean vel: 2.21 cm2
AV Mean grad: 4 mmHg
AV Peak grad: 6.4 mmHg
Ao pk vel: 1.26 m/s
Area-P 1/2: 3.43 cm2
MV VTI: 2.62 cm2
S' Lateral: 2.3 cm
Weight: 2067.03 oz

## 2022-04-24 LAB — BASIC METABOLIC PANEL
Anion gap: 9 (ref 5–15)
BUN: 6 mg/dL — ABNORMAL LOW (ref 8–23)
CO2: 28 mmol/L (ref 22–32)
Calcium: 8.4 mg/dL — ABNORMAL LOW (ref 8.9–10.3)
Chloride: 102 mmol/L (ref 98–111)
Creatinine, Ser: 0.59 mg/dL (ref 0.44–1.00)
GFR, Estimated: >60 mL/min (ref >60)
Glucose, Bld: 85 mg/dL (ref 70–99)
Potassium: 3.2 mmol/L — ABNORMAL LOW (ref 3.5–5.1)
Sodium: 139 mmol/L (ref 135–145)

## 2022-04-24 MED ORDER — LABETALOL HCL 5 MG/ML IV SOLN
5.0000 mg | Freq: Once | INTRAVENOUS | Status: AC
Start: 2022-04-24 — End: 2022-04-24
  Administered 2022-04-24: 5 mg via INTRAVENOUS
  Filled 2022-04-24: qty 4

## 2022-04-24 MED ORDER — BENAZEPRIL HCL 20 MG PO TABS
40.0000 mg | ORAL_TABLET | Freq: Every day | ORAL | Status: DC
  Administered 2022-04-24: 40 mg via ORAL
  Filled 2022-04-24: qty 2

## 2022-04-24 MED ORDER — POTASSIUM CHLORIDE CRYS ER 20 MEQ PO TBCR
40.0000 meq | EXTENDED_RELEASE_TABLET | Freq: Once | ORAL | Status: AC
  Administered 2022-04-24: 40 meq via ORAL
  Filled 2022-04-24: qty 2

## 2022-04-24 MED ORDER — HYDRALAZINE HCL 25 MG PO TABS
25.0000 mg | ORAL_TABLET | Freq: Three times a day (TID) | ORAL | Status: DC
Start: 2022-04-24 — End: 2022-04-26
  Administered 2022-04-24 – 2022-04-25 (×3): 25 mg via ORAL
  Filled 2022-04-24 (×5): qty 1

## 2022-04-24 NOTE — ED Notes (Signed)
MD Alcario Drought notified of pt BP

## 2022-04-24 NOTE — Progress Notes (Signed)
  Progress Note  Patient: [name] SWH:[id number] DOB: [birth year]  DOA: 04/23/2022  DOS: 04/24/2022    Brief hospital course: [name] is a 81 y.o. female with a history of HTN who presented to the ED with days of increasing dyspnea, orthopnea, leg swelling. She was hypertensive and short of breath with elevated BNP (310), wheezing improved with albuterol, troponin negative x2. Cardiomegaly and hiatal hernia without acute findings on CXR, ECG NSR. Hypertension medications were given, IV lasix started and the patient was admitted for suspected acute CHF, and COPD exacerbation.   Assessment and Plan: Acute respiratory distress: Due to suspected acute CHF.  - Update echocardiogram - Continue lasix '40mg'$  IV BID for now. Already 1.5L out overnight. Continue monitoring I/I, weights.   HTN with HTN urgency:  - Improving on home medications norvasc, ACEi, metoprolol. Will hold ACE in preparation for possible entresto if HFrEF by echo.  - Add hydralazine '25mg'$  po q8h, continue prn  Acute COPD exacerbation: Improving symptoms.  - Continue bronchodilators - Holding systemic steroid given improvement in symptoms thus far.   Hypokalemia:  - Supplement and monitor.   Chest pain: Related to dyspnea most likely. Troponin negative x2, no ischemic ECG changes, d-dimer is wnl, and chest pain is resolved.  - Monitor telemetry  Hiatal hernia:  - GI evaluation if becomes symptomatic  Dementia: Per report, not on meds for this  Subjective: Swelling in legs is decreased, shortness of breath has improved, hasn't gotten up and moved around yet.   Objective: Vitals:   04/24/22 0815 04/24/22 0901 04/24/22 0924 04/24/22 1307  BP: (!) 184/105 (!) 155/90  (!) 163/90  Pulse:  99 (!) 101   Resp:   18   Temp:  (P) 98.1 F (36.7 C) 98.1 F (36.7 C)   TempSrc:  (P) Oral Oral   SpO2:  95% 96%   Weight:       Gen: Alert, interactive 81 y.o. female in no distress Pulm: Mildly labored while speaking  and tachypneic at rest with mild end-expiratory wheeze x1 that did not recur after several breaths. Diminished globally.  CV: Regular rate and rhythm. No murmur, rub, or gallop. No JVD, 1+ dependent edema. GI: Abdomen soft, non-tender, non-distended, with normoactive bowel sounds.  Ext: Warm, no deformities Skin: No rashes, lesions or ulcers on visualized skin. Neuro: Alert and oriented. No focal neurological deficits. Psych: Judgement and insight appear fair. Mood euthymic & affect congruent. Behavior is appropriate.    Data Personally reviewed: CBC wnl  Recent Labs  Lab 04/23/22 1502 04/24/22 0327  NA 140 139  K 3.7 3.2*  CL 104 102  CO2 26 28  GLUCOSE 104* 85  BUN 6* 6*  CREATININE 0.67 0.59  CALCIUM 8.8* 8.4*   TBili 1.3, other LFTs wnl.     DG Chest 2 View 6/8 No active cardiopulmonary disease.  Large hiatal hernia.     Family Communication: None at bedside  Disposition: Status is: Inpatient Remains inpatient appropriate because: Continue IV diuresis Planned Discharge Destination: Home  Patrecia Pour, MD 04/24/2022 1:51 PM Page by [email or web address]

## 2022-04-24 NOTE — ED Notes (Signed)
Pt moved to hospital bed for comfort.

## 2022-04-24 NOTE — Progress Notes (Signed)
  Echocardiogram 2D Echocardiogram has been performed.  [name] 04/24/2022, 11:48 AM

## 2022-04-24 NOTE — ED Notes (Signed)
ED TO INPATIENT HANDOFF REPORT  ED Nurse Name and Phone #: Deneise Lever [phone]  S Name/Age/Gender [name] 81 y.o. female Room/Bed: 015C/015C  Code Status   Code Status: Full Code  Home/SNF/Other Home Patient oriented to: self, place, time, and situation Is this baseline? Yes   Triage Complete: Triage complete  Chief Complaint Hypertensive urgency [I16.0]  Triage Note Pt went to MD today for recent lower leg swelling. Pt has had exertional SOB and CP that started last night. Pt was seen by EMS last night but did not want to come to the hospital so she went to her primary MD today. Sent here for CHF eval. EMS gave '324mg'$  ASA   Allergies Allergies  Allergen Reactions   Codeine Anaphylaxis    Other reaction(s): Unknown    Level of Care/Admitting Diagnosis ED Disposition     ED Disposition  Admit   Condition  --   Comment  Hospital Area: [facility] [[id number]]  Level of Care: Progressive [102]  Admit to Progressive based on following criteria: RESPIRATORY PROBLEMS hypoxemic/hypercapnic respiratory failure that is responsive to NIPPV (BiPAP) or High Flow Nasal Cannula (6-80 lpm). Frequent assessment/intervention, no > Q2 hrs < Q4 hrs, to maintain oxygenation and pulmonary hygiene.  May place patient in observation at [facility] or [facility] if equivalent level of care is available:: Yes  Covid Evaluation: Asymptomatic - no recent exposure (last 10 days) testing not required  Diagnosis: Hypertensive urgency [650126]  Admitting Physician: Shela Leff [[id number]]  Attending Physician: Shela Leff [[id number]]          B Medical/Surgery History Past Medical History:  Diagnosis Date   Arthritis    "knees" (01/18/2018)   Breast cancer, left breast ([place])    Chronic bronchitis ([place])    COPD (chronic obstructive pulmonary disease) ([place])    GERD (gastroesophageal reflux disease)    Headache    "just about qd" (01/18/2018)   History of hiatal  hernia    History of stomach ulcers    Hypertension    Memory difficulty    Migraine    "once/month maybe" (01/18/2018)   Pneumonia    "maybe twice" (01/18/2018)   Thyroid disease    Past Surgical History:  Procedure Laterality Date   ABDOMINAL HYSTERECTOMY     "partial"   APPENDECTOMY     BACK SURGERY     BREAST LUMPECTOMY Left    DILATION AND CURETTAGE OF UTERUS     FEMUR FRACTURE SURGERY Left 22/3006   Archie Endo 04/01/2011   I & D KNEE WITH POLY EXCHANGE Right 12/19/2002   Archie Endo 04/01/2011   JOINT REPLACEMENT     KNEE ARTHROSCOPY Left 09/2001   with popliteal cyst excision and synovectomy /notes 04/01/2011   LAPAROSCOPIC CHOLECYSTECTOMY  11/2004   Archie Endo 04/01/2011   LUMBAR FUSION  05/2001   Archie Endo 04/01/2011   ORIF FEMUR FRACTURE Right 01/19/2018   Procedure: OPEN REDUCTION INTERNAL FIXATION (ORIF) DISTAL FEMUR FRACTURE;  Surgeon: Hiram Gash, MD;  Location: [place];  Service: Orthopedics;  Laterality: Right;   TONSILLECTOMY     TOTAL HIP ARTHROPLASTY Bilateral    TOTAL HIP REVISION Left 04/2003   /notes 04/01/2011   TOTAL KNEE ARTHROPLASTY Bilateral 07/2000   Archie Endo 04/01/2011   TOTAL KNEE REVISION Left 08/2003   Archie Endo 04/01/2011   WRIST SURGERY Right    "? break"     A IV Location/Drains/Wounds Patient Lines/Drains/Airways Status     Active Line/Drains/Airways  Name Placement date Placement time Site Days   Peripheral IV 04/23/22 20 G Left Antecubital 04/23/22  1817  Antecubital  1   External Urinary Catheter 04/23/22  1842  --  1   Incision (Closed) 01/19/18 Leg Right 01/19/18  1639  -- 1556            Intake/Output Last 24 hours  Intake/Output Summary (Last 24 hours) at 04/24/2022 0757 Last data filed at 04/24/2022 0137 Gross per 24 hour  Intake --  Output 1500 ml  Net -1500 ml    Labs/Imaging Results for orders placed or performed during the hospital encounter of 04/23/22 (from the past 48 hour(s))  D-dimer, quantitative     Status: None   Collection  Time: 04/23/22  2:52 PM  Result Value Ref Range   D-Dimer, Quant 0.49 0.00 - 0.50 ug/mL-FEU    Comment: (NOTE) At the manufacturer cut-off value of 0.5 g/mL FEU, this assay has a negative predictive value of 95-100%.This assay is intended for use in conjunction with a clinical pretest probability (PTP) assessment model to exclude pulmonary embolism (PE) and deep venous thrombosis (DVT) in outpatients suspected of PE or DVT. Results should be correlated with clinical presentation. Performed at [facility], [place] [address] [place], [place] [address]   Brain natriuretic peptide     Status: Abnormal   Collection Time: 04/23/22  3:02 PM  Result Value Ref Range   B Natriuretic Peptide 310.3 (H) 0.0 - 100.0 pg/mL    Comment: Performed at [facility] [address] [place], [place] [address]  Comprehensive metabolic panel     Status: Abnormal   Collection Time: 04/23/22  3:02 PM  Result Value Ref Range   Sodium 140 135 - 145 mmol/L   Potassium 3.7 3.5 - 5.1 mmol/L   Chloride 104 98 - 111 mmol/L   CO2 26 22 - 32 mmol/L   Glucose, Bld 104 (H) 70 - 99 mg/dL    Comment: Glucose reference range applies only to samples taken after fasting for at least 8 hours.   BUN 6 (L) 8 - 23 mg/dL   Creatinine, Ser 0.67 0.44 - 1.00 mg/dL   Calcium 8.8 (L) 8.9 - 10.3 mg/dL   Total Protein 7.7 6.5 - 8.1 g/dL   Albumin 4.1 3.5 - 5.0 g/dL   AST 23 15 - 41 U/L   ALT 8 0 - 44 U/L   Alkaline Phosphatase 70 38 - 126 U/L   Total Bilirubin 1.3 (H) 0.3 - 1.2 mg/dL   GFR, Estimated >60 >60 mL/min    Comment: (NOTE) Calculated using the CKD-EPI Creatinine Equation (2021)    Anion gap 10 5 - 15    Comment: Performed at [facility], [place] [address] [place], [place] [address]  CBC with Differential     Status: None   Collection Time: 04/23/22  3:02 PM  Result Value Ref Range   WBC 8.8 4.0 - 10.5 K/uL   RBC 4.59 3.87 - 5.11 MIL/uL   Hemoglobin 14.5 12.0 - 15.0 g/dL   HCT 45.7 36.0 - 46.0  %   MCV 99.6 80.0 - 100.0 fL   MCH 31.6 26.0 - 34.0 pg   MCHC 31.7 30.0 - 36.0 g/dL   RDW 12.6 11.5 - 15.5 %   Platelets 188 150 - 400 K/uL   nRBC 0.0 0.0 - 0.2 %   Neutrophils Relative % 81 %   Neutro Abs 7.1 1.7 - 7.7  K/uL   Lymphocytes Relative 13 %   Lymphs Abs 1.1 0.7 - 4.0 K/uL   Monocytes Relative 4 %   Monocytes Absolute 0.4 0.1 - 1.0 K/uL   Eosinophils Relative 1 %   Eosinophils Absolute 0.1 0.0 - 0.5 K/uL   Basophils Relative 1 %   Basophils Absolute 0.0 0.0 - 0.1 K/uL   Immature Granulocytes 0 %   Abs Immature Granulocytes 0.03 0.00 - 0.07 K/uL    Comment: Performed at [facility] [address] [place], [place] [address]  Troponin I (High Sensitivity)     Status: None   Collection Time: 04/23/22  3:02 PM  Result Value Ref Range   Troponin I (High Sensitivity) 8 <18 ng/L    Comment: (NOTE) Elevated high sensitivity troponin I (hsTnI) values and significant  changes across serial measurements may suggest ACS but many other  chronic and acute conditions are known to elevate hsTnI results.  Refer to the "Links" section for chest pain algorithms and additional  guidance. Performed at [facility], [place] [address] [place], [place] [address]   Troponin I (High Sensitivity)     Status: None   Collection Time: 04/23/22  4:39 PM  Result Value Ref Range   Troponin I (High Sensitivity) 10 <18 ng/L    Comment: (NOTE) Elevated high sensitivity troponin I (hsTnI) values and significant  changes across serial measurements may suggest ACS but many other  chronic and acute conditions are known to elevate hsTnI results.  Refer to the "Links" section for chest pain algorithms and additional  guidance. Performed at [facility], [place] [address], [place], [place] [address]   Basic metabolic panel     Status: Abnormal   Collection Time: 04/24/22  3:27 AM  Result Value Ref Range   Sodium 139 135 - 145 mmol/L   Potassium 3.2 (L) 3.5 - 5.1 mmol/L   Chloride  102 98 - 111 mmol/L   CO2 28 22 - 32 mmol/L   Glucose, Bld 85 70 - 99 mg/dL    Comment: Glucose reference range applies only to samples taken after fasting for at least 8 hours.   BUN 6 (L) 8 - 23 mg/dL   Creatinine, Ser 0.59 0.44 - 1.00 mg/dL   Calcium 8.4 (L) 8.9 - 10.3 mg/dL   GFR, Estimated >60 >60 mL/min    Comment: (NOTE) Calculated using the CKD-EPI Creatinine Equation (2021)    Anion gap 9 5 - 15    Comment: Performed at [facility] [address], [place], [place] [address]   DG Chest 2 View  Result Date: 04/23/2022 CLINICAL DATA:  sob EXAM: CHEST - 2 VIEW COMPARISON:  January 02, 2019 FINDINGS: Mild cardiomegaly. Atheromatous calcifications of the arch of the aorta. Large hiatal hernia extending to the right lung base. Minor atelectasis at the bibasilar regions. There are some interstitial changes seen as well. Osteopenia. There is some deformity seen at the right humeral head. IMPRESSION: No active cardiopulmonary disease.  Large hiatal hernia. Electronically Signed   By: Frazier Richards M.D.   On: 04/23/2022 15:49    Pending Labs Unresulted Labs (From admission, onward)    None       Vitals/Pain Today's Vitals   04/24/22 0625 04/24/22 0630 04/24/22 0640 04/24/22 0700  BP: (!) 160/92   (!) 173/100  Pulse: 86 82  91  Resp: (!) 28 17  (!) 25  Temp:      TempSrc:  SpO2: 95% 95%  96%  Weight:   58.6 kg   PainSc:        Isolation Precautions No active isolations  Medications Medications  acetaminophen (TYLENOL) tablet 1,000 mg (1,000 mg Oral Given 04/23/22 2059)  amLODipine (NORVASC) tablet 5 mg (has no administration in time range)  metoprolol tartrate (LOPRESSOR) tablet 50 mg (has no administration in time range)  enoxaparin (LOVENOX) injection 40 mg (40 mg Subcutaneous Given 04/23/22 2314)  ipratropium-albuterol (DUONEB) 0.5-2.5 (3) MG/3ML nebulizer solution 3 mL (has no administration in time range)  furosemide (LASIX) injection 40 mg (has no  administration in time range)  hydrALAZINE (APRESOLINE) injection 10 mg (10 mg Intravenous Given 04/23/22 2312)  ibuprofen (ADVIL) tablet 400 mg (has no administration in time range)  Muscle Rub CREA ( Topical Given 04/24/22 0131)  benazepril (LOTENSIN) tablet 40 mg (has no administration in time range)  labetalol (NORMODYNE) injection 20 mg (20 mg Intravenous Given 04/23/22 1826)  furosemide (LASIX) injection 40 mg (40 mg Intravenous Given 04/23/22 1824)  albuterol (PROVENTIL) (2.5 MG/3ML) 0.083% nebulizer solution 5 mg (5 mg Nebulization Given 04/23/22 1823)  amLODipine (NORVASC) tablet 5 mg (5 mg Oral Given 04/23/22 1941)  metoprolol tartrate (LOPRESSOR) tablet 50 mg (50 mg Oral Given 04/23/22 1941)  benazepril (LOTENSIN) tablet 40 mg (40 mg Oral Given 04/23/22 1957)  labetalol (NORMODYNE) injection 5 mg (5 mg Intravenous Given 04/24/22 0438)  potassium chloride SA (KLOR-CON M) CR tablet 40 mEq (40 mEq Oral Given 04/24/22 0100)    Mobility SOB w/ swelling d/t CHF exac, on purewick, have not ambulated d/t weakness High fall risk   Focused Assessments    R Recommendations: See Admitting Provider Note  Report given to:   Additional Notes: Pt is on purewick, working well for diuresis w/ lasix. Has been slightly hypertensive down here but responding well to antihypertensive medications. No acute complaints at this time.

## 2022-04-25 DIAGNOSIS — K449 Diaphragmatic hernia without obstruction or gangrene: Secondary | ICD-10-CM | POA: Diagnosis not present

## 2022-04-25 DIAGNOSIS — I509 Heart failure, unspecified: Secondary | ICD-10-CM | POA: Diagnosis not present

## 2022-04-25 DIAGNOSIS — I160 Hypertensive urgency: Secondary | ICD-10-CM | POA: Diagnosis not present

## 2022-04-25 DIAGNOSIS — J449 Chronic obstructive pulmonary disease, unspecified: Secondary | ICD-10-CM | POA: Diagnosis not present

## 2022-04-25 LAB — BASIC METABOLIC PANEL
Anion gap: 16 — ABNORMAL HIGH (ref 5–15)
BUN: 16 mg/dL (ref 8–23)
CO2: 24 mmol/L (ref 22–32)
Calcium: 8.4 mg/dL — ABNORMAL LOW (ref 8.9–10.3)
Chloride: 99 mmol/L (ref 98–111)
Creatinine, Ser: 1.41 mg/dL — ABNORMAL HIGH (ref 0.44–1.00)
GFR, Estimated: 37 mL/min — ABNORMAL LOW (ref >60)
Glucose, Bld: 84 mg/dL (ref 70–99)
Potassium: 4 mmol/L (ref 3.5–5.1)
Sodium: 139 mmol/L (ref 135–145)

## 2022-04-25 MED ORDER — METOPROLOL TARTRATE 50 MG PO TABS
50.0000 mg | ORAL_TABLET | Freq: Two times a day (BID) | ORAL | Status: DC
  Administered 2022-04-25 – 2022-04-27 (×4): 50 mg via ORAL
  Filled 2022-04-25 (×4): qty 1

## 2022-04-25 MED ORDER — ALBUTEROL SULFATE (2.5 MG/3ML) 0.083% IN NEBU: 2.5000 mg | INHALATION_SOLUTION | RESPIRATORY_TRACT | Status: DC | PRN

## 2022-04-25 MED ORDER — IPRATROPIUM-ALBUTEROL 0.5-2.5 (3) MG/3ML IN SOLN
3.0000 mL | Freq: Every day | RESPIRATORY_TRACT | Status: DC
  Administered 2022-04-26: 3 mL via RESPIRATORY_TRACT
  Filled 2022-04-25 (×3): qty 3

## 2022-04-25 MED ORDER — METHYLPREDNISOLONE SODIUM SUCC 125 MG IJ SOLR
80.0000 mg | INTRAMUSCULAR | Status: DC
  Administered 2022-04-25: 80 mg via INTRAVENOUS
  Filled 2022-04-25: qty 2

## 2022-04-25 NOTE — Progress Notes (Signed)
CSW acknowledges consult for SNF/HH. The patient will require PT/OT evaluations. TOC will assist with disposition planning once the evaluations have been completed.  °  °TOC will continue to follow.    °

## 2022-04-25 NOTE — Progress Notes (Signed)
Mobility Specialist Progress Note    04/25/22 1706  Mobility  Activity Ambulated with assistance in room  Level of Assistance Contact guard assist, steadying assist  Assistive Device Front wheel walker  Distance Ambulated (ft) 8 ft  Activity Response Tolerated well  $Mobility charge 1 Mobility   Pre-Mobility: 86 HR, 93% SpO2 Post-Mobility: 82 HR, 93% SpO2  Pt received in bed declining further ambulation but agreeable to get to chair. C/o back pain. Left with call bell in reach and chair alarm.   Hildred Alamin Mobility Specialist

## 2022-04-25 NOTE — Progress Notes (Signed)
Progress Note  Patient: [name] EGB:[id number] DOB: Jun 28, 1941  DOA: 04/23/2022  DOS: 04/25/2022    Brief hospital course: [name] is a 81 y.o. female with a history of HTN who presented to the ED with days of increasing dyspnea, orthopnea, leg swelling. She was hypertensive and short of breath with elevated BNP (310), wheezing improved with albuterol, troponin negative x2. Cardiomegaly and hiatal hernia without acute findings on CXR, ECG NSR. Hypertension medications were given, IV lasix started and the patient was admitted for suspected acute CHF, and COPD exacerbation.   Assessment and Plan: Acute respiratory distress: Due primarily to COPD exacerbation than acute CHF based on response to diuretic trial.  - Treat as below, monitor respiratory status closely  AECOPD:  - Give duoneb now, continue albuterol prn.  - Start solumedrol - IS, FV  Acute on chronic HFpEF: Echo this admission with LVEF 65-70%, mod LVH w/G1DD, no WMA. RV, IVC, and PA pressures normal.  - Hold further diuresis, monitor I/O, weights, and metabolic panel. Edema nearly resolved.    AKI: Creatinine up with diuresis, will stop lasix and monitor metabolic panel.  - If po intake poor through the day, would start gentle IVF. - Monitor BMP in AM  HTN with HTN urgency:  - Improving on home medications norvasc, ACEi, metoprolol. Will hold ACE in preparation for possible entresto if HFrEF by echo.  - Add hydralazine '25mg'$  po q8h, continue prn  Acute COPD exacerbation: Improving symptoms.  - Continue bronchodilators - Holding systemic steroid given improvement in symptoms thus far.   Hypokalemia:  - Supplemented with resolution  Chest pain: Related to dyspnea most likely. Troponin negative x2, no ischemic ECG changes, d-dimer is wnl, and chest pain is resolved.  - Monitor telemetry  Hiatal hernia:  - GI evaluation if becomes symptomatic  Dementia: Per report, not on meds for this.  - Delirium  precautions  Subjective: Tired this morning, examined early. Feels she's been wheezing but no chest pain or cough. Leg swelling has gone down.  Objective: Vitals:   04/25/22 0723 04/25/22 1024 04/25/22 1345 04/25/22 1523  BP: (!) 117/58 132/63 127/86 117/65  Pulse: 79 93  88  Resp: '16 18  18  '$ Temp: 98.6 F (37 C) 98.2 F (36.8 C)  98.5 F (36.9 C)  TempSrc: Oral Oral  Oral  SpO2: 92% 93%  91%  Weight:      Gen: Drowsy, rousable, elderly female in no distress Pulm: Nonlabored tachypnea at rest with expiratory wheezes throughout, no crackles. CV: Regular rate and rhythm. No murmur, rub, or gallop. No JVD, minimal dependent edema. GI: Abdomen soft, non-tender, non-distended, with normoactive bowel sounds.  Ext: Warm, no deformities Skin: No rashes, lesions or ulcers on visualized skin. Neuro: Oriented. No focal neurological deficits. Psych: Judgement and insight appear fair. Mood euthymic & affect congruent. Behavior is appropriate.    Data Personally reviewed: CBC wnl  Recent Labs  Lab 04/23/22 1502 04/24/22 0327 04/25/22 0051  NA 140 139 139  K 3.7 3.2* 4.0  CL 104 102 99  CO2 '26 28 24  '$ GLUCOSE 104* 85 84  BUN 6* 6* 16  CREATININE 0.67 0.59 1.41*  CALCIUM 8.8* 8.4* 8.4*   TBili 1.3, other LFTs wnl.     DG Chest 2 View 6/8 No active cardiopulmonary disease.  Large hiatal hernia.     Family Communication: None at bedside  Disposition: Status is: Inpatient Remains inpatient appropriate because: IV steroids, monitoring AKI. Planned Discharge Destination:  Home  Patrecia Pour, MD 04/25/2022 5:45 PM Page by [email or web address]

## 2022-04-26 ENCOUNTER — Inpatient Hospital Stay (HOSPITAL_COMMUNITY): Payer: Medicare Other

## 2022-04-26 DIAGNOSIS — I161 Hypertensive emergency: Secondary | ICD-10-CM

## 2022-04-26 DIAGNOSIS — I509 Heart failure, unspecified: Secondary | ICD-10-CM | POA: Diagnosis not present

## 2022-04-26 DIAGNOSIS — F0390 Unspecified dementia without behavioral disturbance: Secondary | ICD-10-CM | POA: Diagnosis not present

## 2022-04-26 DIAGNOSIS — J441 Chronic obstructive pulmonary disease with (acute) exacerbation: Secondary | ICD-10-CM | POA: Diagnosis not present

## 2022-04-26 DIAGNOSIS — R079 Chest pain, unspecified: Secondary | ICD-10-CM | POA: Diagnosis not present

## 2022-04-26 LAB — BASIC METABOLIC PANEL
Anion gap: 19 — ABNORMAL HIGH (ref 5–15)
BUN: 49 mg/dL — ABNORMAL HIGH (ref 8–23)
CO2: 20 mmol/L — ABNORMAL LOW (ref 22–32)
Calcium: 8 mg/dL — ABNORMAL LOW (ref 8.9–10.3)
Chloride: 97 mmol/L — ABNORMAL LOW (ref 98–111)
Creatinine, Ser: 2.45 mg/dL — ABNORMAL HIGH (ref 0.44–1.00)
GFR, Estimated: 19 mL/min — ABNORMAL LOW (ref >60)
Glucose, Bld: 134 mg/dL — ABNORMAL HIGH (ref 70–99)
Potassium: 4.2 mmol/L (ref 3.5–5.1)
Sodium: 136 mmol/L (ref 135–145)

## 2022-04-26 LAB — PROTEIN / CREATININE RATIO, URINE
Creatinine, Urine: 181.5 mg/dL
Protein Creatinine Ratio: 0.1 mg/mg{Cre} (ref 0.00–0.15)
Total Protein, Urine: 19 mg/dL

## 2022-04-26 LAB — URINALYSIS, COMPLETE (UACMP) WITH MICROSCOPIC
Bacteria, UA: NONE SEEN
Bilirubin Urine: NEGATIVE
Glucose, UA: NEGATIVE mg/dL
Hgb urine dipstick: NEGATIVE
Ketones, ur: 5 mg/dL — AB
Leukocytes,Ua: NEGATIVE
Nitrite: NEGATIVE
Protein, ur: NEGATIVE mg/dL
Specific Gravity, Urine: 1.016 (ref 1.005–1.030)
pH: 5 (ref 5.0–8.0)

## 2022-04-26 LAB — SODIUM, URINE, RANDOM: Sodium, Ur: 44 mmol/L

## 2022-04-26 MED ORDER — LACTATED RINGERS IV SOLN: INTRAVENOUS | Status: DC

## 2022-04-26 MED ORDER — ENOXAPARIN SODIUM 30 MG/0.3ML IJ SOSY
30.0000 mg | PREFILLED_SYRINGE | INTRAMUSCULAR | Status: DC
  Administered 2022-04-26: 30 mg via SUBCUTANEOUS
  Filled 2022-04-26: qty 0.3

## 2022-04-26 MED ORDER — PREDNISONE 20 MG PO TABS
40.0000 mg | ORAL_TABLET | Freq: Every day | ORAL | Status: DC
  Administered 2022-04-26 – 2022-04-27 (×2): 40 mg via ORAL
  Filled 2022-04-26 (×2): qty 2

## 2022-04-26 NOTE — Evaluation (Signed)
Occupational Therapy Evaluation Patient Details Name: [name] MRN: [id number] DOB: 13-Dec-1940 Today's Date: 04/26/2022   History of Present Illness Pt is an 81 y.o. female who presented 04/23/22 with dyspnea, orthopnea, and lower extremity edema. Admitted for suspected acute CHF and COPD exacerbation. PMH: HTN, breast cancer, chronic bronchitis, COPD, GERD, migraine, dementia   Clinical Impression   Pt admitted for concerns listed above. PTA pt was fairly independent with ADL's and functional mobility. She uses a RW most of the time. At this time, pt continues to be independent with functional mobility and ADL's, requiring only multimodal cuing due to cognitive deficits, which appear at her baseline. Pt has no further skilled OT needs. Acute OT will sign off.       Recommendations for follow up therapy are one component of a multi-disciplinary discharge planning process, led by the attending physician.  Recommendations may be updated based on patient status, additional functional criteria and insurance authorization.   Follow Up Recommendations  No OT follow up    Assistance Recommended at Discharge Frequent or constant Supervision/Assistance  Patient can return home with the following Direct supervision/assist for medications management;Direct supervision/assist for financial management;Assist for transportation;Assistance with cooking/housework    Functional Status Assessment  Patient has had a recent decline in their functional status and demonstrates the ability to make significant improvements in function in a reasonable and predictable amount of time.  Equipment Recommendations  None recommended by OT    Recommendations for Other Services       Precautions / Restrictions Precautions Precautions: Fall;Other (comment) Precaution Comments: watch SpO2 Restrictions Weight Bearing Restrictions: No      Mobility Bed Mobility Overal bed mobility: Modified Independent              General bed mobility comments: no assist needed to come to sitting    Transfers Overall transfer level: Needs assistance Equipment used: 1 person hand held assist Transfers: Sit to/from Stand Sit to Stand: Min guard           General transfer comment: Min guard for safety, cues for hand placement      Balance Overall balance assessment: Needs assistance Sitting-balance support: No upper extremity supported, Feet supported Sitting balance-Leahy Scale: Fair     Standing balance support: Bilateral upper extremity supported, During functional activity, Reliant on assistive device for balance Standing balance-Leahy Scale: Poor Standing balance comment: Reliant on external assist or RW                           ADL either performed or assessed with clinical judgement   ADL Overall ADL's : At baseline                                       General ADL Comments: Pt needs assist due to cognitino, able to complete without physical assist.     Vision Baseline Vision/History: 0 No visual deficits Ability to See in Adequate Light: 0 Adequate Patient Visual Report: No change from baseline Vision Assessment?: No apparent visual deficits     Perception     Praxis      Pertinent Vitals/Pain Pain Assessment Faces Pain Scale: No hurt     Hand Dominance Right   Extremity/Trunk Assessment Upper Extremity Assessment Upper Extremity Assessment: Overall WFL for tasks assessed   Lower Extremity Assessment Lower Extremity Assessment: Defer to  PT evaluation   Cervical / Trunk Assessment Cervical / Trunk Assessment: Kyphotic   Communication Communication Communication: HOH   Cognition Arousal/Alertness: Awake/alert Behavior During Therapy: Flat affect Overall Cognitive Status: History of cognitive impairments - at baseline                                 General Comments: Hx of dementia with memeory deficits, appears at  baseline.     General Comments  VSS on RA    Exercises     Shoulder Instructions      Home Living Family/patient expects to be discharged to:: Private residence Living Arrangements: Spouse/significant other Available Help at Discharge: Family;Available 24 hours/day Type of Home: House Home Access: Stairs to enter [employer] of Steps: 3 Entrance Stairs-Rails: None Home Layout: One level     Bathroom Shower/Tub: Tub/shower unit;Sponge bathes at baseline   [employer]:  (bedside commode over top toilet)     Home Equipment: Conservation officer, nature (2 wheels);BSC/3in1;Shower seat;Wheelchair - manual          Prior Functioning/Environment Prior Level of Function : History of Falls (last six months);Needs assist  Cognitive Assist : Mobility (cognitive);ADLs (cognitive)     Physical Assist : Mobility (physical);ADLs (physical) Mobility (physical): Gait;Stairs ADLs (physical): IADLs Mobility Comments: Pt needs supervision for safety with mobility as she will occasionally try to ambulate without her RW and has a hx of falls. Pt tends to avoid mobilizing much throughout the day, only walking short household distances between rooms as needed. Always has assistance on stairs using RW ADLs Comments: Able to manage her own depends, but needs assistance for meds/finances and other cog tasks due to dementia. Avoids shower        OT Problem List: Decreased activity tolerance;Impaired balance (sitting and/or standing)      OT Treatment/Interventions:      OT Goals(Current goals can be found in the care plan section) Acute Rehab OT Goals Patient Stated Goal: To go back home OT Goal Formulation: With patient Time For Goal Achievement: 04/26/22 Potential to Achieve Goals: Good  OT Frequency:      Co-evaluation              AM-PAC OT "6 Clicks" Daily Activity     Outcome Measure Help from another person eating meals?: A Little Help from another person taking care of  personal grooming?: A Little Help from another person toileting, which includes using toliet, bedpan, or urinal?: A Little Help from another person bathing (including washing, rinsing, drying)?: A Little Help from another person to put on and taking off regular upper body clothing?: A Little Help from another person to put on and taking off regular lower body clothing?: A Little 6 Click Score: 18   End of Session Equipment Utilized During Treatment: Gait belt Nurse Communication: Mobility status  Activity Tolerance: Patient tolerated treatment well Patient left: in bed;with call bell/phone within reach;with bed alarm set;with family/visitor present  OT Visit Diagnosis: Unsteadiness on feet (R26.81);Other abnormalities of gait and mobility (R26.89);Muscle weakness (generalized) (M62.81)                Time: 0347-4259 OT Time Calculation (min): 23 min Charges:  OT General Charges $OT Visit: 1 Visit OT Evaluation $OT Eval Low Complexity: 1 Low OT Treatments $Self Care/Home Management : 8-22 mins  Shay Jhaveri H., OTR/L Acute Rehabilitation  Tawn Fitzner Elane Yolanda Bonine 04/26/2022, 5:19 PM

## 2022-04-26 NOTE — Progress Notes (Signed)
Progress Note  Patient: [name] CHY:[id number] DOB: 05-28-1941  DOA: 04/23/2022  DOS: 04/26/2022    Brief hospital course: [name] is a 81 y.o. female with a history of HTN who presented to the ED with days of increasing dyspnea, orthopnea, leg swelling. She was hypertensive and short of breath with elevated BNP (310), wheezing improved with albuterol, troponin negative x2. Cardiomegaly and hiatal hernia without acute findings on CXR, ECG NSR. Hypertension medications were given, IV lasix started and the patient was admitted for suspected acute CHF, and COPD exacerbation. Volume status improved and the patient has had limited oral intake. Creatinine rising w/suspicion for prerenal azotemia for which work up is ordered and IV fluids started. Respiratory status has normalized.  Assessment and Plan: Acute respiratory distress: Due primarily to COPD exacerbation than acute CHF based on response to diuretic trial.  - Treat as below, monitor respiratory status closely  AECOPD:  - Continue albuterol prn.  - Deescalate steroid to prednisone x5 days - IS, FV  Acute on chronic HFpEF: Echo this admission with LVEF 65-70%, mod LVH w/G1DD, no WMA. RV, IVC, and PA pressures normal.  - Holding further diuresis, appears euvolemic, perhaps dehydrated today, monitor I/O, weights, and metabolic panel with gentle hydration today.    AKI: Creatinine up with diuresis which improved dyspnea, then took poor po 6/10, suspect prerenal azotemia causing worsening Cr rise today.  - Will start IVF, LR with mild acidosis.  - Check UA w/micro, UNa, UPr:Cr, and renal U/S.  - Monitor BMP in AM  HTN with HTN urgency:  - We've held ACE inhibitor. - HTN improved, to avoid overcorrection, stop amlodipine, hydralazine '25mg'$  po q8h  - continue metoprolol  Hypokalemia:  - Supplemented with resolution  Chest pain: Related to dyspnea most likely. Troponin negative x2, no ischemic ECG changes, d-dimer is wnl, and  chest pain is resolved.  - Monitor telemetry  Hiatal hernia:  - GI evaluation if becomes symptomatic  Dementia: Per report, not on meds for this.  - Delirium precautions  Subjective: Hasn't been eating or drinking much per NT. Was in chair earlier this morning, now back to bed. Complains of chronic intermittent back pain. Denies dyspnea. No chest pain. Wheezing improved.  Objective: Vitals:   04/26/22 0008 04/26/22 0520 04/26/22 0743 04/26/22 0901  BP: 108/60 109/71  109/65  Pulse: 79 67  82  Resp: (!) '23 20  16  '$ Temp:  97.7 F (36.5 C)  (!) 97.4 F (36.3 C)  TempSrc:  Oral  Oral  SpO2: 91% 93% 94% 95%  Weight:  57.8 kg    Gen: Alert, elderly female in no distress Pulm: Nonlabored breathing room air. Cleared crackles and wheezes. CV: Regular rate and rhythm. No significant murmur, no rub, or gallop. No JVD, no pitting dependent edema. GI: Abdomen soft, non-tender, non-distended, with normoactive bowel sounds.  Ext: Warm, no deformities Skin: No rashes, lesions or ulcers on visualized skin. Neuro: More alert. Oriented to person, place, not situation. No focal neurological deficits. Psych: Judgement and insight appear marginal. Mood euthymic & affect congruent. Behavior is appropriate.    Data Personally reviewed: CBC wnl  Recent Labs  Lab 04/23/22 1502 04/24/22 0327 04/25/22 0051 04/26/22 0205  NA 140 139 139 136  K 3.7 3.2* 4.0 4.2  CL 104 102 99 97*  CO2 '26 28 24 '$ 20*  GLUCOSE 104* 85 84 134*  BUN 6* 6* 16 49*  CREATININE 0.67 0.59 1.41* 2.45*  CALCIUM 8.8* 8.4*  8.4* 8.0*   TBili 1.3, other LFTs wnl.     DG Chest 2 View 6/8 No active cardiopulmonary disease.  Large hiatal hernia.     Family Communication: None at bedside. Husband by phone  Disposition: Status is: Inpatient Remains inpatient appropriate because: Work up and management of AKI. Planned Discharge Destination: Home pending PT evaluation  Patrecia Pour, MD 04/26/2022 9:28 AM Page by [email or web address]

## 2022-04-26 NOTE — Evaluation (Signed)
Physical Therapy Evaluation Patient Details Name: [name] MRN: [id number] DOB: Aug 25, 1941 Today's Date: 04/26/2022  History of Present Illness  Pt is an 81 y.o. female who presented 04/23/22 with dyspnea, orthopnea, and lower extremity edema. Admitted for suspected acute CHF and COPD exacerbation. PMH: HTN, breast cancer, chronic bronchitis, COPD, GERD, migraine, dementia   Clinical Impression  Pt presents with condition above and deficits mentioned below, see PT Problem List. PTA, she performed functional mobility with a RW at a supervision level for safety and required assistance on the stairs. Pt lives with her husband in a 1-level house with 3 STE. Pt has memory deficits at baseline with hx of dementia. Currently, pt displays deficits in strength, activity tolerance, and balance. Pt required min guard for transfers and min guard-minA to ambulate with a RW, needing several standing rest breaks. Pt would benefit from further acute PT services and HHPT follow-up to address her deficits and thus improve her independence and safety with all functional mobility.     Recommendations for follow up therapy are one component of a multi-disciplinary discharge planning process, led by the attending physician.  Recommendations may be updated based on patient status, additional functional criteria and insurance authorization.  Follow Up Recommendations Home health PT    Assistance Recommended at Discharge Frequent or constant Supervision/Assistance  Patient can return home with the following  A little help with walking and/or transfers;A little help with bathing/dressing/bathroom;Assistance with cooking/housework;Direct supervision/assist for medications management;Direct supervision/assist for financial management;Assist for transportation;Help with stairs or ramp for entrance    Equipment Recommendations Other (comment) (tub bench)  Recommendations for Other Services       Functional Status  Assessment Patient has had a recent decline in their functional status and demonstrates the ability to make significant improvements in function in a reasonable and predictable amount of time.     Precautions / Restrictions Precautions Precautions: Fall;Other (comment) Precaution Comments: watch SpO2 Restrictions Weight Bearing Restrictions: No      Mobility  Bed Mobility Overal bed mobility: Needs Assistance Bed Mobility: Supine to Sit     Supine to sit: Supervision, HOB elevated     General bed mobility comments: Supervision for safety, HOB elevated    Transfers Overall transfer level: Needs assistance Equipment used: Rolling walker (2 wheels) Transfers: Sit to/from Stand Sit to Stand: Min guard           General transfer comment: Min guard for safety, cues for hand placement    Ambulation/Gait Ambulation/Gait assistance: Min guard, Min assist Gait Distance (Feet): 100 Feet Assistive device: Rolling walker (2 wheels) Gait Pattern/deviations: Step-through pattern, Decreased stride length, Trunk flexed Gait velocity: reduced Gait velocity interpretation: <1.31 ft/sec, indicative of household ambulator   General Gait Details: Pt with slow, mildly unsteady gait with flexed posture, pushing RW distal to her. Min guard-minA for stability, x3 standing rest breaks  Stairs            Wheelchair Mobility    Modified Rankin (Stroke Patients Only)       Balance Overall balance assessment: Needs assistance Sitting-balance support: No upper extremity supported, Feet supported Sitting balance-Leahy Scale: Fair     Standing balance support: Bilateral upper extremity supported, During functional activity, Reliant on assistive device for balance Standing balance-Leahy Scale: Poor Standing balance comment: Reliant on RW                             Pertinent Vitals/Pain  Pain Assessment Pain Assessment: Faces Faces Pain Scale: No hurt Pain  Intervention(s): Monitored during session    Home Living Family/patient expects to be discharged to:: Private residence Living Arrangements: Spouse/significant other Available Help at Discharge: Family;Available 24 hours/day Type of Home: House Home Access: Stairs to enter Entrance Stairs-Rails: None Entrance Stairs-Number of Steps: 3   Home Layout: One level Home Equipment: Conservation officer, nature (2 wheels);BSC/3in1;Shower seat;Wheelchair - manual      Prior Function Prior Level of Function : History of Falls (last six months);Needs assist  Cognitive Assist : Mobility (cognitive);ADLs (cognitive)     Physical Assist : Mobility (physical);ADLs (physical) Mobility (physical): Gait;Stairs ADLs (physical): IADLs Mobility Comments: Pt needs supervision for safety with mobility as she will occasionally try to ambulate without her RW and has a hx of falls. Pt tends to avoid mobilizing much throughout the day, only walking short household distances between rooms as needed. Always has assistance on stairs using RW ADLs Comments: Able to manage her own depends, but needs assistance for meds/finances and other cog tasks due to dementia. Avoids shower     Hand Dominance        Extremity/Trunk Assessment   Upper Extremity Assessment Upper Extremity Assessment: Defer to OT evaluation    Lower Extremity Assessment Lower Extremity Assessment: Generalized weakness    Cervical / Trunk Assessment Cervical / Trunk Assessment: Kyphotic  Communication   Communication: HOH  Cognition Arousal/Alertness: Awake/alert Behavior During Therapy: Flat affect Overall Cognitive Status: History of cognitive impairments - at baseline                                 General Comments: Hx of dementia with memeory deficits, appears at baseline.        General Comments General comments (skin integrity, edema, etc.): SpO2 >/= 94% on RA; educated pt and family on increasing frequency of activity  to improve endurance/activity tolerance    Exercises     Assessment/Plan    PT Assessment Patient needs continued PT services  PT Problem List Decreased strength;Decreased activity tolerance;Decreased balance;Decreased mobility;Decreased cognition;Decreased safety awareness;Cardiopulmonary status limiting activity       PT Treatment Interventions DME instruction;Gait training;Stair training;Therapeutic activities;Functional mobility training;Therapeutic exercise;Balance training;Neuromuscular re-education;Cognitive remediation;Patient/family education    PT Goals (Current goals can be found in the Care Plan section)  Acute Rehab PT Goals Patient Stated Goal: agreeable to session PT Goal Formulation: With patient/family Time For Goal Achievement: 05/10/22 Potential to Achieve Goals: Good    Frequency Min 3X/week     Co-evaluation               AM-PAC PT "6 Clicks" Mobility  Outcome Measure Help needed turning from your back to your side while in a flat bed without using bedrails?: A Little Help needed moving from lying on your back to sitting on the side of a flat bed without using bedrails?: A Little Help needed moving to and from a bed to a chair (including a wheelchair)?: A Little Help needed standing up from a chair using your arms (e.g., wheelchair or bedside chair)?: A Little Help needed to walk in hospital room?: A Little Help needed climbing 3-5 steps with a railing? : A Little 6 Click Score: 18    End of Session Equipment Utilized During Treatment: Gait belt Activity Tolerance: Patient tolerated treatment well Patient left: in chair;with call bell/phone within reach;with family/visitor present   PT Visit Diagnosis: Unsteadiness  on feet (R26.81);Other abnormalities of gait and mobility (R26.89);Muscle weakness (generalized) (M62.81);Difficulty in walking, not elsewhere classified (R26.2);History of falling (Z91.81)    Time: 0867-6195 PT Time Calculation  (min) (ACUTE ONLY): 24 min   Charges:   PT Evaluation $PT Eval Moderate Complexity: 1 Mod PT Treatments $Gait Training: 8-22 mins        Moishe Spice, PT, DPT Acute Rehabilitation Services  Office: [phone]   Orvan Falconer 04/26/2022, 1:05 PM

## 2022-04-27 LAB — BASIC METABOLIC PANEL
Anion gap: 12 (ref 5–15)
BUN: 61 mg/dL — ABNORMAL HIGH (ref 8–23)
CO2: 26 mmol/L (ref 22–32)
Calcium: 7.5 mg/dL — ABNORMAL LOW (ref 8.9–10.3)
Chloride: 99 mmol/L (ref 98–111)
Creatinine, Ser: 1.3 mg/dL — ABNORMAL HIGH (ref 0.44–1.00)
GFR, Estimated: 41 mL/min — ABNORMAL LOW (ref >60)
Glucose, Bld: 106 mg/dL — ABNORMAL HIGH (ref 70–99)
Potassium: 3.9 mmol/L (ref 3.5–5.1)
Sodium: 137 mmol/L (ref 135–145)

## 2022-04-27 MED ORDER — METOPROLOL TARTRATE 50 MG PO TABS: 50.0000 mg | ORAL_TABLET | Freq: Two times a day (BID) | ORAL | 0 refills | Status: AC

## 2022-04-27 NOTE — Plan of Care (Signed)
DISCHARGE NOTE HOME [name] to be discharged home per MD order. Discussed prescriptions and follow up appointments with the patient. Medication list explained in detail. Patient verbalized understanding.  Skin clean, dry and intact without evidence of skin break down, no evidence of skin tears noted. IV catheter discontinued intact. Site without signs and symptoms of complications. Dressing and pressure applied. Pt denies pain at the site currently. No complaints noted.  Patient free of lines, drains, and wounds.   An After Visit Summary (AVS) was printed and given to the patient. Patient to be escorted via wheelchair, and discharged home via private auto.  Stephan Minister, RN

## 2022-04-27 NOTE — Care Management Important Message (Signed)
Important Message  Patient Details  Name: [name] MRN: [id number] Date of Birth: [birth year]   Medicare Important Message Given:  Yes     Shelda Altes 04/27/2022, 11:26 AM

## 2022-04-27 NOTE — Progress Notes (Signed)
Mobility Specialist: Progress Note   04/27/22 1213  Mobility  Activity Ambulated with assistance in hallway  Level of Assistance Contact guard assist, steadying assist  Assistive Device Front wheel walker  Distance Ambulated (ft) 140 ft  Activity Response Tolerated well  $Mobility charge 1 Mobility   Post-Mobility: 78 HR, 176/87 (111) BP, 95% SpO2  Pt received in the bed and agreeable to mobility. Endorsed dizziness during ambulation requiring x1 standing break. Pt back to bed after session with call bell and phone at her side. Family present in the room.   [facility] Asar [employer] Mobility Specialist Mobility Specialist [address]: [phone]

## 2022-04-27 NOTE — Discharge Summary (Signed)
Physician Discharge Summary  [name] XKG:[id number] DOB: 06-18-1941 DOA: 04/23/2022  PCP: Breanna Low, MD  Admit date: 04/23/2022 Discharge date: 04/27/2022  Admitted From: Home Disposition: Home  Recommendations for Outpatient Follow-up:  Follow up with PCP later this week as discussed  Home Health:HHPT  Equipment/Devices:None  Discharge Condition:Stable  CODE STATUS:Full  Diet recommendation:     Brief/Interim Summary: [name] is a 81 y.o. female with a history of HTN who presented to the ED with days of increasing dyspnea, orthopnea, leg swelling. She was hypertensive and short of breath with elevated BNP (310), wheezing improved with albuterol, troponin negative x2. Cardiomegaly and hiatal hernia without acute findings on CXR, ECG NSR. Hypertension medications were given, IV lasix started and the patient was admitted for suspected acute CHF, and COPD exacerbation. Volume status improved and the patient has had limited oral intake. Creatinine rising w/suspicion for prerenal azotemia for which work up is ordered and IV fluids started. Respiratory status has normalized.  Discharge Diagnoses:  Principal Problem:   CHF (congestive heart failure) (HCC) Active Problems:   Dementia (HCC)   Hypertensive urgency   COPD (chronic obstructive pulmonary disease) (HCC)   Chest pain   Hiatal hernia   Hypertensive emergency  Acute respiratory distress with hypoxia, POA, resolved:  - Due primarily to COPD exacerbation than acute CHF based on response to diuretic trial.  -See below   Acute exacerbation COPD:  - Continue albuterol prn.  -Completed steroid taper, continue supportive care at home   Acute on chronic HFpEF:  - Echo this admission with LVEF 65-70%, mod LVH w/G1DD, no WMA. RV, IVC, and PA pressures normal.  - Holding further diuresis, appears euvolemic, perhaps dehydrated today, monitor I/O, weights, and metabolic panel with gentle hydration today.     AKI:   -Resolved after IV fluids, likely secondary to profound diuresis previously   HTN with HTN urgency:  Resume home medications as below, multiple home blood pressure medications were discontinued given patient's normotensive over the past 48 hours off these medications given her advanced age she may likely benefit from decrease medical regimen   Hypokalemia:  - Supplemented with resolution   Chest pain: Related to dyspnea most likely. Troponin negative x2, no ischemic ECG changes, d-dimer is wnl, and chest pain is resolved.  - Monitor telemetry   Hiatal hernia:  - GI evaluation if becomes symptomatic   Dementia: Per report, not on meds for this.  - Delirium precautions   Discharge Instructions  Discharge Instructions     Discharge patient   Complete by: As directed    Discharge disposition: 01-Home or Self Care   Discharge patient date: 04/27/2022      Allergies as of 04/27/2022       Reactions   Codeine Anaphylaxis   Other reaction(s): Unknown        Medication List     STOP taking these medications    amLODipine 5 MG tablet Commonly known as: NORVASC   aspirin EC 81 MG tablet   benazepril 40 MG tablet Commonly known as: LOTENSIN   enoxaparin 40 MG/0.4ML injection Commonly known as: LOVENOX   feeding supplement Liqd   ketorolac 10 MG tablet Commonly known as: TORADOL   nitroGLYCERIN 0.4 MG SL tablet Commonly known as: NITROSTAT       TAKE these medications    acetaminophen 650 MG CR tablet Commonly known as: TYLENOL Take 1,300 mg by mouth every 8 (eight) hours as needed for pain.  albuterol 108 (90 Base) MCG/ACT inhaler Commonly known as: VENTOLIN HFA Inhale 2 puffs into the lungs every 4 (four) hours as needed for wheezing or shortness of breath. What changed: Another medication with the same name was removed. Continue taking this medication, and follow the directions you see here.   alendronate 70 MG tablet Commonly known as:  FOSAMAX Take 70 mg by mouth every Monday. Take with a full glass of water on an empty stomach.   metoprolol tartrate 50 MG tablet Commonly known as: LOPRESSOR Take 1 tablet (50 mg total) by mouth 2 (two) times daily. What changed: when to take this   mirtazapine 15 MG tablet Commonly known as: REMERON Take 15 mg by mouth at bedtime.        Allergies  Allergen Reactions   Codeine Anaphylaxis    Other reaction(s): Unknown    Procedures/Studies: US RENAL  Result Date: 04/26/2022 CLINICAL DATA:  Acute kidney injury. EXAM: RENAL / URINARY TRACT ULTRASOUND COMPLETE COMPARISON:  None Available. FINDINGS: Right Kidney: Renal measurements: 9.6 x 4.6 x 6.3 cm = volume: 146 mL. Echogenicity within normal limits. No mass or hydronephrosis visualized. Left Kidney: Renal measurements: 9.7 x 5.1 x 5.9 cm = volume: 152 mL. Echogenicity within normal limits. No mass or hydronephrosis visualized. Bladder: Appears normal for degree of bladder distention. Other: None. IMPRESSION: Normal renal ultrasound. Electronically Signed   By: Lajean Manes M.D.   On: 04/26/2022 10:32   ECHOCARDIOGRAM COMPLETE  Result Date: 04/24/2022    ECHOCARDIOGRAM REPORT   Patient Name:   [name] Date of Exam: 04/24/2022 Medical Rec #:  [id number]        Height:       64.8 in Accession #:    [id number]       Weight:       129.2 lb Date of Birth:  10-Aug-1941        BSA:          1.638 m Patient Age:    60 years         BP:           155/90 mmHg Patient Gender: F                HR:           92 bpm. Exam Location:  Inpatient Procedure: 2D Echo, Cardiac Doppler and Color Doppler Indications:    CHF-acute systolic  History:        Patient has prior history of Echocardiogram examinations, most                 recent 11/03/2019. COPD, Signs/Symptoms:Shortness of Breath and                 Chest Pain; Risk Factors:Hypertension. GERD.  Sonographer:    Clayton Lefort RDCS (AE) Referring Phys: [id number] [facility]  Sonographer  Comments: Suboptimal subcostal window. IMPRESSIONS  1. Left ventricular ejection fraction, by estimation, is 65 to 70%. The left ventricle has normal function. The left ventricle has no regional wall motion abnormalities. There is moderate left ventricular hypertrophy. Left ventricular diastolic parameters are consistent with Grade I diastolic dysfunction (impaired relaxation).  2. Right ventricular systolic function is normal. The right ventricular size is normal. There is normal pulmonary artery systolic pressure. The estimated right ventricular systolic pressure is 90.2 mmHg.  3. Left atrial size was mildly dilated.  4. The mitral valve is normal in structure. No evidence of mitral valve regurgitation. No  evidence of mitral stenosis.  5. The aortic valve is tricuspid. There is mild calcification of the aortic valve. Aortic valve regurgitation is not visualized. No aortic stenosis is present.  6. The inferior vena cava is normal in size with greater than 50% respiratory variability, suggesting right atrial pressure of 3 mmHg. FINDINGS  Left Ventricle: Left ventricular ejection fraction, by estimation, is 65 to 70%. The left ventricle has normal function. The left ventricle has no regional wall motion abnormalities. The left ventricular internal cavity size was normal in size. There is  moderate left ventricular hypertrophy. Left ventricular diastolic parameters are consistent with Grade I diastolic dysfunction (impaired relaxation). Right Ventricle: The right ventricular size is normal. No increase in right ventricular wall thickness. Right ventricular systolic function is normal. There is normal pulmonary artery systolic pressure. The tricuspid regurgitant velocity is 2.51 m/s, and  with an assumed right atrial pressure of 3 mmHg, the estimated right ventricular systolic pressure is 42.3 mmHg. Left Atrium: Left atrial size was mildly dilated. Right Atrium: Right atrial size was normal in size. Pericardium:  Trivial pericardial effusion is present. Mitral Valve: The mitral valve is normal in structure. Mild mitral annular calcification. No evidence of mitral valve regurgitation. No evidence of mitral valve stenosis. MV peak gradient, 4.2 mmHg. The mean mitral valve gradient is 2.0 mmHg. Tricuspid Valve: The tricuspid valve is normal in structure. Tricuspid valve regurgitation is trivial. Aortic Valve: The aortic valve is tricuspid. There is mild calcification of the aortic valve. Aortic valve regurgitation is not visualized. No aortic stenosis is present. Aortic valve mean gradient measures 4.0 mmHg. Aortic valve peak gradient measures 6.4 mmHg. Aortic valve area, by VTI measures 2.27 cm. Pulmonic Valve: The pulmonic valve was normal in structure. Pulmonic valve regurgitation is not visualized. Aorta: The aortic root is normal in size and structure. Venous: The inferior vena cava is normal in size with greater than 50% respiratory variability, suggesting right atrial pressure of 3 mmHg. IAS/Shunts: No atrial level shunt detected by color flow Doppler.  LEFT VENTRICLE PLAX 2D LVIDd:         3.20 cm   Diastology LVIDs:         2.30 cm   LV e' medial:    4.12 cm/s LV PW:         1.00 cm   LV E/e' medial:  19.2 LV IVS:        1.70 cm   LV e' lateral:   6.18 cm/s LVOT diam:     2.00 cm   LV E/e' lateral: 12.8 LV SV:         52 LV SV Index:   32 LVOT Area:     3.14 cm  RIGHT VENTRICLE RV Basal diam:  2.30 cm RV S prime:     20.20 cm/s TAPSE (M-mode): 1.8 cm LEFT ATRIUM             Index        RIGHT ATRIUM           Index LA diam:        3.00 cm 1.83 cm/m   RA Area:     11.00 cm LA Vol (A2C):   50.4 ml 30.76 ml/m  RA Volume:   21.10 ml  12.88 ml/m LA Vol (A4C):   31.0 ml 18.92 ml/m LA Biplane Vol: 40.5 ml 24.72 ml/m  AORTIC VALVE AV Area (Vmax):    2.17 cm AV Area (Vmean):   2.21 cm AV  Area (VTI):     2.27 cm AV Vmax:           126.00 cm/s AV Vmean:          89.300 cm/s AV VTI:            0.231 m AV Peak Grad:       6.4 mmHg AV Mean Grad:      4.0 mmHg LVOT Vmax:         87.20 cm/s LVOT Vmean:        62.900 cm/s LVOT VTI:          0.167 m LVOT/AV VTI ratio: 0.72  AORTA Ao Root diam: 3.40 cm Ao Asc diam:  3.10 cm MITRAL VALVE                TRICUSPID VALVE MV Area (PHT): 3.43 cm     TR Peak grad:   25.2 mmHg MV Area VTI:   2.62 cm     TR Vmax:        251.00 cm/s MV Peak grad:  4.2 mmHg MV Mean grad:  2.0 mmHg     SHUNTS MV Vmax:       1.02 m/s     Systemic VTI:  0.17 m MV Vmean:      62.5 cm/s    Systemic Diam: 2.00 cm MV Decel Time: 221 msec MV E velocity: 79.10 cm/s MV A velocity: 106.00 cm/s MV E/A ratio:  0.75 Dalton McleanMD Electronically signed by Franki Monte Signature Date/Time: 04/24/2022/4:45:33 PM    Final    DG Chest 2 View  Result Date: 04/23/2022 CLINICAL DATA:  sob EXAM: CHEST - 2 VIEW COMPARISON:  January 02, 2019 FINDINGS: Mild cardiomegaly. Atheromatous calcifications of the arch of the aorta. Large hiatal hernia extending to the right lung base. Minor atelectasis at the bibasilar regions. There are some interstitial changes seen as well. Osteopenia. There is some deformity seen at the right humeral head. IMPRESSION: No active cardiopulmonary disease.  Large hiatal hernia. Electronically Signed   By: Frazier Richards M.D.   On: 04/23/2022 15:49     Subjective: No acute issues overnight   Discharge Exam: Vitals:   04/27/22 0930 04/27/22 1058  BP: (!) 150/73 139/77  Pulse: 69 70  Resp: 20 20  Temp: 97.6 F (36.4 C) (!) 97.5 F (36.4 C)  SpO2: 93% 93%   Vitals:   04/27/22 0002 04/27/22 0337 04/27/22 0930 04/27/22 1058  BP: 137/67 134/80 (!) 150/73 139/77  Pulse: 73 75 69 70  Resp: '20 20 20 20  '$ Temp: 97.6 F (36.4 C) 97.6 F (36.4 C) 97.6 F (36.4 C) (!) 97.5 F (36.4 C)  TempSrc: Oral Oral Oral Oral  SpO2: 93% 95% 93% 93%  Weight:  57.4 kg     General: Pt is alert, awake, not in acute distress Cardiovascular: RRR, S1/S2 +, no rubs, no gallops Respiratory: CTA bilaterally, no  wheezing, no rhonchi Abdominal: Soft, NT, ND, bowel sounds + Extremities: no edema, no cyanosis  The results of significant diagnostics from this hospitalization (including imaging, microbiology, ancillary and laboratory) are listed below for reference.     Microbiology: No results found for this or any previous visit (from the past 240 hour(s)).   Labs: BNP (last 3 results) Recent Labs    04/23/22 1502  BNP 102.7*   Basic Metabolic Panel: Recent Labs  Lab 04/23/22 1502 04/24/22 0327 04/25/22 0051 04/26/22 0205 04/27/22 0609  NA 140 139 139 136 137  K 3.7 3.2* 4.0 4.2 3.9  CL 104 102 99 97* 99  CO2 '26 28 24 '$ 20* 26  GLUCOSE 104* 85 84 134* 106*  BUN 6* 6* 16 49* 61*  CREATININE 0.67 0.59 1.41* 2.45* 1.30*  CALCIUM 8.8* 8.4* 8.4* 8.0* 7.5*   Liver Function Tests: Recent Labs  Lab 04/23/22 1502  AST 23  ALT 8  ALKPHOS 70  BILITOT 1.3*  PROT 7.7  ALBUMIN 4.1   No results for input(s): "LIPASE", "AMYLASE" in the last 168 hours. No results for input(s): "AMMONIA" in the last 168 hours. CBC: Recent Labs  Lab 04/23/22 1502  WBC 8.8  NEUTROABS 7.1  HGB 14.5  HCT 45.7  MCV 99.6  PLT 188   Cardiac Enzymes: No results for input(s): "CKTOTAL", "CKMB", "CKMBINDEX", "TROPONINI" in the last 168 hours. BNP: Invalid input(s): "POCBNP" CBG: No results for input(s): "GLUCAP" in the last 168 hours. D-Dimer No results for input(s): "DDIMER" in the last 72 hours. Hgb A1c No results for input(s): "HGBA1C" in the last 72 hours. Lipid Profile No results for input(s): "CHOL", "HDL", "LDLCALC", "TRIG", "CHOLHDL", "LDLDIRECT" in the last 72 hours. Thyroid function studies No results for input(s): "TSH", "T4TOTAL", "T3FREE", "THYROIDAB" in the last 72 hours.  Invalid input(s): "FREET3" Anemia work up No results for input(s): "VITAMINB12", "FOLATE", "FERRITIN", "TIBC", "IRON", "RETICCTPCT" in the last 72 hours. Urinalysis    Component Value Date/Time   COLORURINE AMBER  (A) 04/26/2022 0633   APPEARANCEUR HAZY (A) 04/26/2022 0633   LABSPEC 1.016 04/26/2022 0633   PHURINE 5.0 04/26/2022 0633   GLUCOSEU NEGATIVE 04/26/2022 0633   HGBUR NEGATIVE 04/26/2022 0633   BILIRUBINUR NEGATIVE 04/26/2022 0633   KETONESUR 5 (A) 04/26/2022 0633   PROTEINUR NEGATIVE 04/26/2022 0633   NITRITE NEGATIVE 04/26/2022 0633   LEUKOCYTESUR NEGATIVE 04/26/2022 0633   Sepsis Labs Recent Labs  Lab 04/23/22 1502  WBC 8.8   Microbiology No results found for this or any previous visit (from the past 240 hour(s)).   Time coordinating discharge: Over 30 minutes  SIGNED:   Little Ishikawa, DO [facility] 04/27/2022, 11:05 AM Pager   If 7PM-7AM, please contact night-coverage [email or web address]

## 2022-04-27 NOTE — TOC Transition Note (Addendum)
Transition of Care (TOC) - CM/SW Discharge Note Marvetta Gibbons RN, BSN Transitions of Care Unit 4E- RN Case Manager See Treatment Team for direct phone #    Patient Details  Name: [name] MRN: [id number] Date of Birth: [birth year]  Transition of Care [facility]) CM/SW Contact:  Dawayne Patricia, RN Phone Number: 04/27/2022, 2:46 PM   Clinical Narrative:    Pt stable for transition home w/ family today. Noted recommendations for HHPT- CM spoke with pt and family at bedside. Pt hesitant about HH at first but then agreed to try [facility] therapy. States she has had HH in past with someone from [place]- per review in patient PING- agency in [facility] as [facility]. Pt agreeable to referral- and if unable to take does not have any further preference and will defer to [facility] to see if angency can be found for needed services- HHPT.   Address, phone # and PCP all confirmed with pt, family to transport home. Pt reports she has RW at home.   Call made to [facility]- spoke with Tillie Rung- however they are unable to accept at this time due to staffing in pt's area.  Call made to [facility]- unable to accept due to staffing.  Call made to [facility]- referral has been accepted. - msg left for pt regarding [place] arrangements    Final next level of care: Home w Home Health Services Barriers to Discharge: No Barriers Identified   Patient Goals and CMS Choice Patient states their goals for this hospitalization and ongoing recovery are:: want to go home CMS Medicare.gov Compare Post Acute Care list provided to:: Patient Choice offered to / list presented to : Patient  Discharge Placement                 Home w/ [facility]      Discharge Plan and Services   Discharge Planning Services: CM Consult Post Acute Care Choice: Home Health          DME Arranged: N/A DME Agency: NA       HH Arranged: PT [place] Agency: [place] ([facility]) Date [place]: 04/27/22 Time [place]: 7622 Representative spoke with at [place]: [place] ([place]) Interventions     Readmission Risk Interventions    04/27/2022    2:45 PM  Readmission Risk Prevention Plan  Post Dischage Appt Complete  Medication Screening Complete  Transportation Screening Complete

## 2022-04-27 NOTE — Progress Notes (Signed)
Heart Failure Navigator Progress Note  Assessed for Heart & Vascular TOC clinic readiness.  Patient does not meet criteria due to admission related to COPD vs Heart Failure, per MD    [name], BSN, RN Heart Failure Nurse Navigator Secure Chat Only

## 2022-04-28 DIAGNOSIS — Z853 Personal history of malignant neoplasm of breast: Secondary | ICD-10-CM | POA: Diagnosis not present

## 2022-04-28 DIAGNOSIS — G43909 Migraine, unspecified, not intractable, without status migrainosus: Secondary | ICD-10-CM | POA: Diagnosis not present

## 2022-04-28 DIAGNOSIS — M170 Bilateral primary osteoarthritis of knee: Secondary | ICD-10-CM | POA: Diagnosis not present

## 2022-04-28 DIAGNOSIS — K449 Diaphragmatic hernia without obstruction or gangrene: Secondary | ICD-10-CM | POA: Diagnosis not present

## 2022-04-28 DIAGNOSIS — Z9181 History of falling: Secondary | ICD-10-CM | POA: Diagnosis not present

## 2022-04-28 DIAGNOSIS — I5033 Acute on chronic diastolic (congestive) heart failure: Secondary | ICD-10-CM | POA: Diagnosis not present

## 2022-04-28 DIAGNOSIS — I110 Hypertensive heart disease with heart failure: Secondary | ICD-10-CM | POA: Diagnosis not present

## 2022-04-28 DIAGNOSIS — J441 Chronic obstructive pulmonary disease with (acute) exacerbation: Secondary | ICD-10-CM | POA: Diagnosis not present

## 2022-04-28 DIAGNOSIS — K219 Gastro-esophageal reflux disease without esophagitis: Secondary | ICD-10-CM | POA: Diagnosis not present

## 2022-05-04 DIAGNOSIS — D510 Vitamin B12 deficiency anemia due to intrinsic factor deficiency: Secondary | ICD-10-CM | POA: Diagnosis not present

## 2022-05-04 DIAGNOSIS — J9601 Acute respiratory failure with hypoxia: Secondary | ICD-10-CM | POA: Diagnosis not present

## 2022-05-04 DIAGNOSIS — N179 Acute kidney failure, unspecified: Secondary | ICD-10-CM | POA: Diagnosis not present

## 2022-05-04 DIAGNOSIS — E039 Hypothyroidism, unspecified: Secondary | ICD-10-CM | POA: Diagnosis not present

## 2022-05-04 DIAGNOSIS — I10 Essential (primary) hypertension: Secondary | ICD-10-CM | POA: Diagnosis not present

## 2022-05-04 DIAGNOSIS — J449 Chronic obstructive pulmonary disease, unspecified: Secondary | ICD-10-CM | POA: Diagnosis not present

## 2022-05-04 DIAGNOSIS — I5030 Unspecified diastolic (congestive) heart failure: Secondary | ICD-10-CM | POA: Diagnosis not present

## 2022-05-04 DIAGNOSIS — M810 Age-related osteoporosis without current pathological fracture: Secondary | ICD-10-CM | POA: Diagnosis not present

## 2022-05-05 DIAGNOSIS — I5033 Acute on chronic diastolic (congestive) heart failure: Secondary | ICD-10-CM | POA: Diagnosis not present

## 2022-05-05 DIAGNOSIS — Z853 Personal history of malignant neoplasm of breast: Secondary | ICD-10-CM | POA: Diagnosis not present

## 2022-05-05 DIAGNOSIS — J441 Chronic obstructive pulmonary disease with (acute) exacerbation: Secondary | ICD-10-CM | POA: Diagnosis not present

## 2022-05-05 DIAGNOSIS — Z9181 History of falling: Secondary | ICD-10-CM | POA: Diagnosis not present

## 2022-05-05 DIAGNOSIS — K449 Diaphragmatic hernia without obstruction or gangrene: Secondary | ICD-10-CM | POA: Diagnosis not present

## 2022-05-05 DIAGNOSIS — G43909 Migraine, unspecified, not intractable, without status migrainosus: Secondary | ICD-10-CM | POA: Diagnosis not present

## 2022-05-05 DIAGNOSIS — K219 Gastro-esophageal reflux disease without esophagitis: Secondary | ICD-10-CM | POA: Diagnosis not present

## 2022-05-05 DIAGNOSIS — M170 Bilateral primary osteoarthritis of knee: Secondary | ICD-10-CM | POA: Diagnosis not present

## 2022-05-05 DIAGNOSIS — I110 Hypertensive heart disease with heart failure: Secondary | ICD-10-CM | POA: Diagnosis not present

## 2022-05-07 DIAGNOSIS — K449 Diaphragmatic hernia without obstruction or gangrene: Secondary | ICD-10-CM | POA: Diagnosis not present

## 2022-05-07 DIAGNOSIS — I110 Hypertensive heart disease with heart failure: Secondary | ICD-10-CM | POA: Diagnosis not present

## 2022-05-07 DIAGNOSIS — Z853 Personal history of malignant neoplasm of breast: Secondary | ICD-10-CM | POA: Diagnosis not present

## 2022-05-07 DIAGNOSIS — M170 Bilateral primary osteoarthritis of knee: Secondary | ICD-10-CM | POA: Diagnosis not present

## 2022-05-07 DIAGNOSIS — K219 Gastro-esophageal reflux disease without esophagitis: Secondary | ICD-10-CM | POA: Diagnosis not present

## 2022-05-07 DIAGNOSIS — G43909 Migraine, unspecified, not intractable, without status migrainosus: Secondary | ICD-10-CM | POA: Diagnosis not present

## 2022-05-07 DIAGNOSIS — J441 Chronic obstructive pulmonary disease with (acute) exacerbation: Secondary | ICD-10-CM | POA: Diagnosis not present

## 2022-05-07 DIAGNOSIS — I5033 Acute on chronic diastolic (congestive) heart failure: Secondary | ICD-10-CM | POA: Diagnosis not present

## 2022-05-07 DIAGNOSIS — Z9181 History of falling: Secondary | ICD-10-CM | POA: Diagnosis not present

## 2022-05-12 DIAGNOSIS — K449 Diaphragmatic hernia without obstruction or gangrene: Secondary | ICD-10-CM | POA: Diagnosis not present

## 2022-05-12 DIAGNOSIS — Z853 Personal history of malignant neoplasm of breast: Secondary | ICD-10-CM | POA: Diagnosis not present

## 2022-05-12 DIAGNOSIS — I5033 Acute on chronic diastolic (congestive) heart failure: Secondary | ICD-10-CM | POA: Diagnosis not present

## 2022-05-12 DIAGNOSIS — M170 Bilateral primary osteoarthritis of knee: Secondary | ICD-10-CM | POA: Diagnosis not present

## 2022-05-12 DIAGNOSIS — Z9181 History of falling: Secondary | ICD-10-CM | POA: Diagnosis not present

## 2022-05-12 DIAGNOSIS — G43909 Migraine, unspecified, not intractable, without status migrainosus: Secondary | ICD-10-CM | POA: Diagnosis not present

## 2022-05-12 DIAGNOSIS — J441 Chronic obstructive pulmonary disease with (acute) exacerbation: Secondary | ICD-10-CM | POA: Diagnosis not present

## 2022-05-12 DIAGNOSIS — I110 Hypertensive heart disease with heart failure: Secondary | ICD-10-CM | POA: Diagnosis not present

## 2022-05-12 DIAGNOSIS — K219 Gastro-esophageal reflux disease without esophagitis: Secondary | ICD-10-CM | POA: Diagnosis not present

## 2022-05-16 DIAGNOSIS — K219 Gastro-esophageal reflux disease without esophagitis: Secondary | ICD-10-CM | POA: Diagnosis not present

## 2022-05-16 DIAGNOSIS — Z853 Personal history of malignant neoplasm of breast: Secondary | ICD-10-CM | POA: Diagnosis not present

## 2022-05-16 DIAGNOSIS — Z9181 History of falling: Secondary | ICD-10-CM | POA: Diagnosis not present

## 2022-05-16 DIAGNOSIS — M170 Bilateral primary osteoarthritis of knee: Secondary | ICD-10-CM | POA: Diagnosis not present

## 2022-05-16 DIAGNOSIS — J441 Chronic obstructive pulmonary disease with (acute) exacerbation: Secondary | ICD-10-CM | POA: Diagnosis not present

## 2022-05-16 DIAGNOSIS — I5033 Acute on chronic diastolic (congestive) heart failure: Secondary | ICD-10-CM | POA: Diagnosis not present

## 2022-05-16 DIAGNOSIS — K449 Diaphragmatic hernia without obstruction or gangrene: Secondary | ICD-10-CM | POA: Diagnosis not present

## 2022-05-16 DIAGNOSIS — I110 Hypertensive heart disease with heart failure: Secondary | ICD-10-CM | POA: Diagnosis not present

## 2022-05-16 DIAGNOSIS — G43909 Migraine, unspecified, not intractable, without status migrainosus: Secondary | ICD-10-CM | POA: Diagnosis not present

## 2022-05-21 DIAGNOSIS — K219 Gastro-esophageal reflux disease without esophagitis: Secondary | ICD-10-CM | POA: Diagnosis not present

## 2022-05-21 DIAGNOSIS — K449 Diaphragmatic hernia without obstruction or gangrene: Secondary | ICD-10-CM | POA: Diagnosis not present

## 2022-05-21 DIAGNOSIS — I110 Hypertensive heart disease with heart failure: Secondary | ICD-10-CM | POA: Diagnosis not present

## 2022-05-21 DIAGNOSIS — J441 Chronic obstructive pulmonary disease with (acute) exacerbation: Secondary | ICD-10-CM | POA: Diagnosis not present

## 2022-05-21 DIAGNOSIS — I5033 Acute on chronic diastolic (congestive) heart failure: Secondary | ICD-10-CM | POA: Diagnosis not present

## 2022-05-21 DIAGNOSIS — G43909 Migraine, unspecified, not intractable, without status migrainosus: Secondary | ICD-10-CM | POA: Diagnosis not present

## 2022-05-21 DIAGNOSIS — Z9181 History of falling: Secondary | ICD-10-CM | POA: Diagnosis not present

## 2022-05-21 DIAGNOSIS — Z853 Personal history of malignant neoplasm of breast: Secondary | ICD-10-CM | POA: Diagnosis not present

## 2022-05-21 DIAGNOSIS — M170 Bilateral primary osteoarthritis of knee: Secondary | ICD-10-CM | POA: Diagnosis not present

## 2022-05-25 DIAGNOSIS — I110 Hypertensive heart disease with heart failure: Secondary | ICD-10-CM | POA: Diagnosis not present

## 2022-05-25 DIAGNOSIS — I5033 Acute on chronic diastolic (congestive) heart failure: Secondary | ICD-10-CM | POA: Diagnosis not present

## 2022-05-25 DIAGNOSIS — K449 Diaphragmatic hernia without obstruction or gangrene: Secondary | ICD-10-CM | POA: Diagnosis not present

## 2022-05-25 DIAGNOSIS — M170 Bilateral primary osteoarthritis of knee: Secondary | ICD-10-CM | POA: Diagnosis not present

## 2022-05-25 DIAGNOSIS — Z853 Personal history of malignant neoplasm of breast: Secondary | ICD-10-CM | POA: Diagnosis not present

## 2022-05-25 DIAGNOSIS — K219 Gastro-esophageal reflux disease without esophagitis: Secondary | ICD-10-CM | POA: Diagnosis not present

## 2022-05-25 DIAGNOSIS — Z9181 History of falling: Secondary | ICD-10-CM | POA: Diagnosis not present

## 2022-05-25 DIAGNOSIS — J441 Chronic obstructive pulmonary disease with (acute) exacerbation: Secondary | ICD-10-CM | POA: Diagnosis not present

## 2022-05-25 DIAGNOSIS — G43909 Migraine, unspecified, not intractable, without status migrainosus: Secondary | ICD-10-CM | POA: Diagnosis not present

## 2022-06-02 DIAGNOSIS — Z9181 History of falling: Secondary | ICD-10-CM | POA: Diagnosis not present

## 2022-06-02 DIAGNOSIS — G43909 Migraine, unspecified, not intractable, without status migrainosus: Secondary | ICD-10-CM | POA: Diagnosis not present

## 2022-06-02 DIAGNOSIS — I110 Hypertensive heart disease with heart failure: Secondary | ICD-10-CM | POA: Diagnosis not present

## 2022-06-02 DIAGNOSIS — M170 Bilateral primary osteoarthritis of knee: Secondary | ICD-10-CM | POA: Diagnosis not present

## 2022-06-02 DIAGNOSIS — Z853 Personal history of malignant neoplasm of breast: Secondary | ICD-10-CM | POA: Diagnosis not present

## 2022-06-02 DIAGNOSIS — J441 Chronic obstructive pulmonary disease with (acute) exacerbation: Secondary | ICD-10-CM | POA: Diagnosis not present

## 2022-06-02 DIAGNOSIS — K219 Gastro-esophageal reflux disease without esophagitis: Secondary | ICD-10-CM | POA: Diagnosis not present

## 2022-06-02 DIAGNOSIS — K449 Diaphragmatic hernia without obstruction or gangrene: Secondary | ICD-10-CM | POA: Diagnosis not present

## 2022-06-02 DIAGNOSIS — I5033 Acute on chronic diastolic (congestive) heart failure: Secondary | ICD-10-CM | POA: Diagnosis not present

## 2022-06-08 DIAGNOSIS — G43909 Migraine, unspecified, not intractable, without status migrainosus: Secondary | ICD-10-CM | POA: Diagnosis not present

## 2022-06-08 DIAGNOSIS — I110 Hypertensive heart disease with heart failure: Secondary | ICD-10-CM | POA: Diagnosis not present

## 2022-06-08 DIAGNOSIS — Z9181 History of falling: Secondary | ICD-10-CM | POA: Diagnosis not present

## 2022-06-08 DIAGNOSIS — J441 Chronic obstructive pulmonary disease with (acute) exacerbation: Secondary | ICD-10-CM | POA: Diagnosis not present

## 2022-06-08 DIAGNOSIS — K219 Gastro-esophageal reflux disease without esophagitis: Secondary | ICD-10-CM | POA: Diagnosis not present

## 2022-06-08 DIAGNOSIS — I5033 Acute on chronic diastolic (congestive) heart failure: Secondary | ICD-10-CM | POA: Diagnosis not present

## 2022-06-08 DIAGNOSIS — Z853 Personal history of malignant neoplasm of breast: Secondary | ICD-10-CM | POA: Diagnosis not present

## 2022-06-08 DIAGNOSIS — K449 Diaphragmatic hernia without obstruction or gangrene: Secondary | ICD-10-CM | POA: Diagnosis not present

## 2022-06-08 DIAGNOSIS — M170 Bilateral primary osteoarthritis of knee: Secondary | ICD-10-CM | POA: Diagnosis not present

## 2022-06-17 DIAGNOSIS — K449 Diaphragmatic hernia without obstruction or gangrene: Secondary | ICD-10-CM | POA: Diagnosis not present

## 2022-06-17 DIAGNOSIS — G43909 Migraine, unspecified, not intractable, without status migrainosus: Secondary | ICD-10-CM | POA: Diagnosis not present

## 2022-06-17 DIAGNOSIS — K219 Gastro-esophageal reflux disease without esophagitis: Secondary | ICD-10-CM | POA: Diagnosis not present

## 2022-06-17 DIAGNOSIS — I110 Hypertensive heart disease with heart failure: Secondary | ICD-10-CM | POA: Diagnosis not present

## 2022-06-17 DIAGNOSIS — Z853 Personal history of malignant neoplasm of breast: Secondary | ICD-10-CM | POA: Diagnosis not present

## 2022-06-17 DIAGNOSIS — Z9181 History of falling: Secondary | ICD-10-CM | POA: Diagnosis not present

## 2022-06-17 DIAGNOSIS — I5033 Acute on chronic diastolic (congestive) heart failure: Secondary | ICD-10-CM | POA: Diagnosis not present

## 2022-06-17 DIAGNOSIS — M170 Bilateral primary osteoarthritis of knee: Secondary | ICD-10-CM | POA: Diagnosis not present

## 2022-06-17 DIAGNOSIS — J441 Chronic obstructive pulmonary disease with (acute) exacerbation: Secondary | ICD-10-CM | POA: Diagnosis not present

## 2022-06-24 DIAGNOSIS — I110 Hypertensive heart disease with heart failure: Secondary | ICD-10-CM | POA: Diagnosis not present

## 2022-06-24 DIAGNOSIS — Z9181 History of falling: Secondary | ICD-10-CM | POA: Diagnosis not present

## 2022-06-24 DIAGNOSIS — M170 Bilateral primary osteoarthritis of knee: Secondary | ICD-10-CM | POA: Diagnosis not present

## 2022-06-24 DIAGNOSIS — K449 Diaphragmatic hernia without obstruction or gangrene: Secondary | ICD-10-CM | POA: Diagnosis not present

## 2022-06-24 DIAGNOSIS — I5033 Acute on chronic diastolic (congestive) heart failure: Secondary | ICD-10-CM | POA: Diagnosis not present

## 2022-06-24 DIAGNOSIS — Z853 Personal history of malignant neoplasm of breast: Secondary | ICD-10-CM | POA: Diagnosis not present

## 2022-06-24 DIAGNOSIS — J441 Chronic obstructive pulmonary disease with (acute) exacerbation: Secondary | ICD-10-CM | POA: Diagnosis not present

## 2022-06-24 DIAGNOSIS — K219 Gastro-esophageal reflux disease without esophagitis: Secondary | ICD-10-CM | POA: Diagnosis not present

## 2022-06-24 DIAGNOSIS — G43909 Migraine, unspecified, not intractable, without status migrainosus: Secondary | ICD-10-CM | POA: Diagnosis not present

## 2022-11-11 IMAGING — CR DG CHEST 2V
2 series · 2 of 2 positions shown · non-contrast
Comparison: January 02, 2019

CLINICAL DATA: sob

EXAM:
CHEST - 2 VIEW

[chest lat]
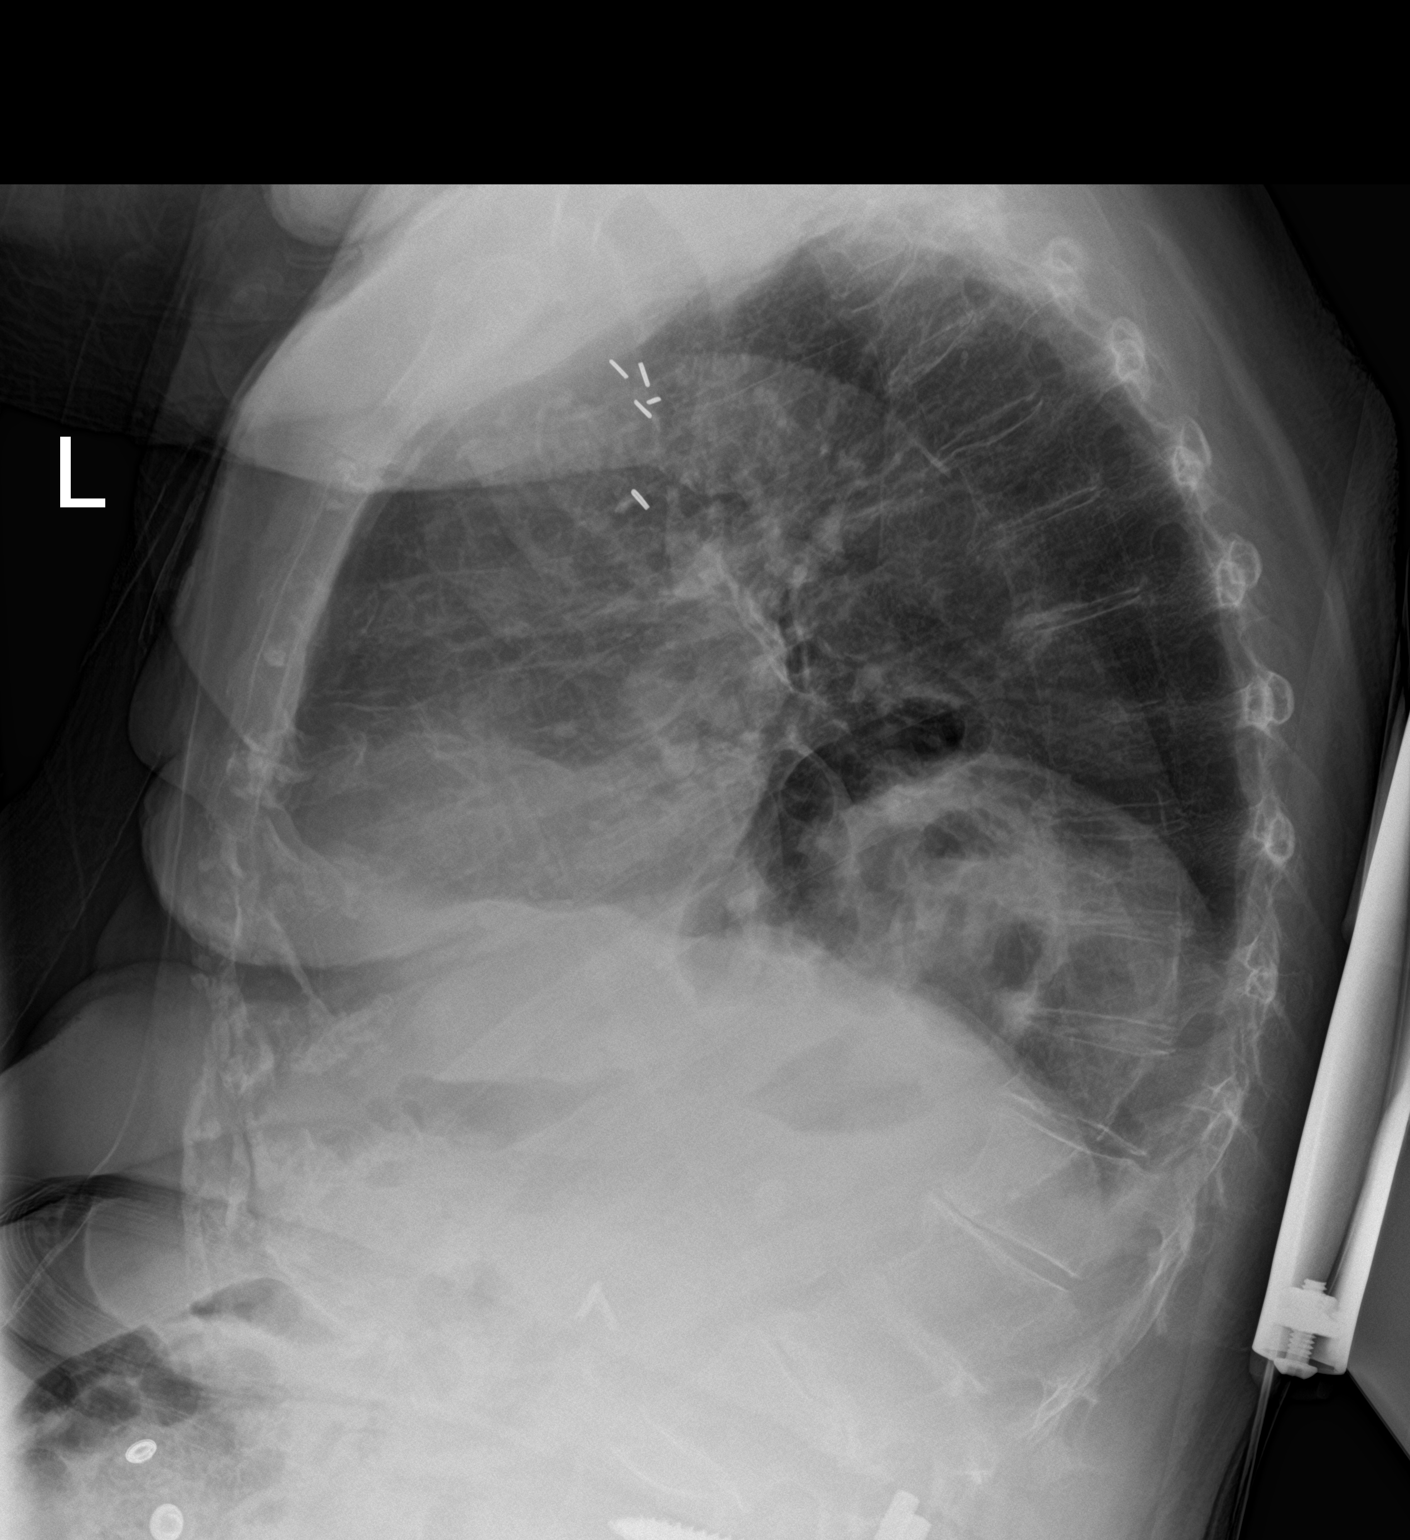

[chest ap]
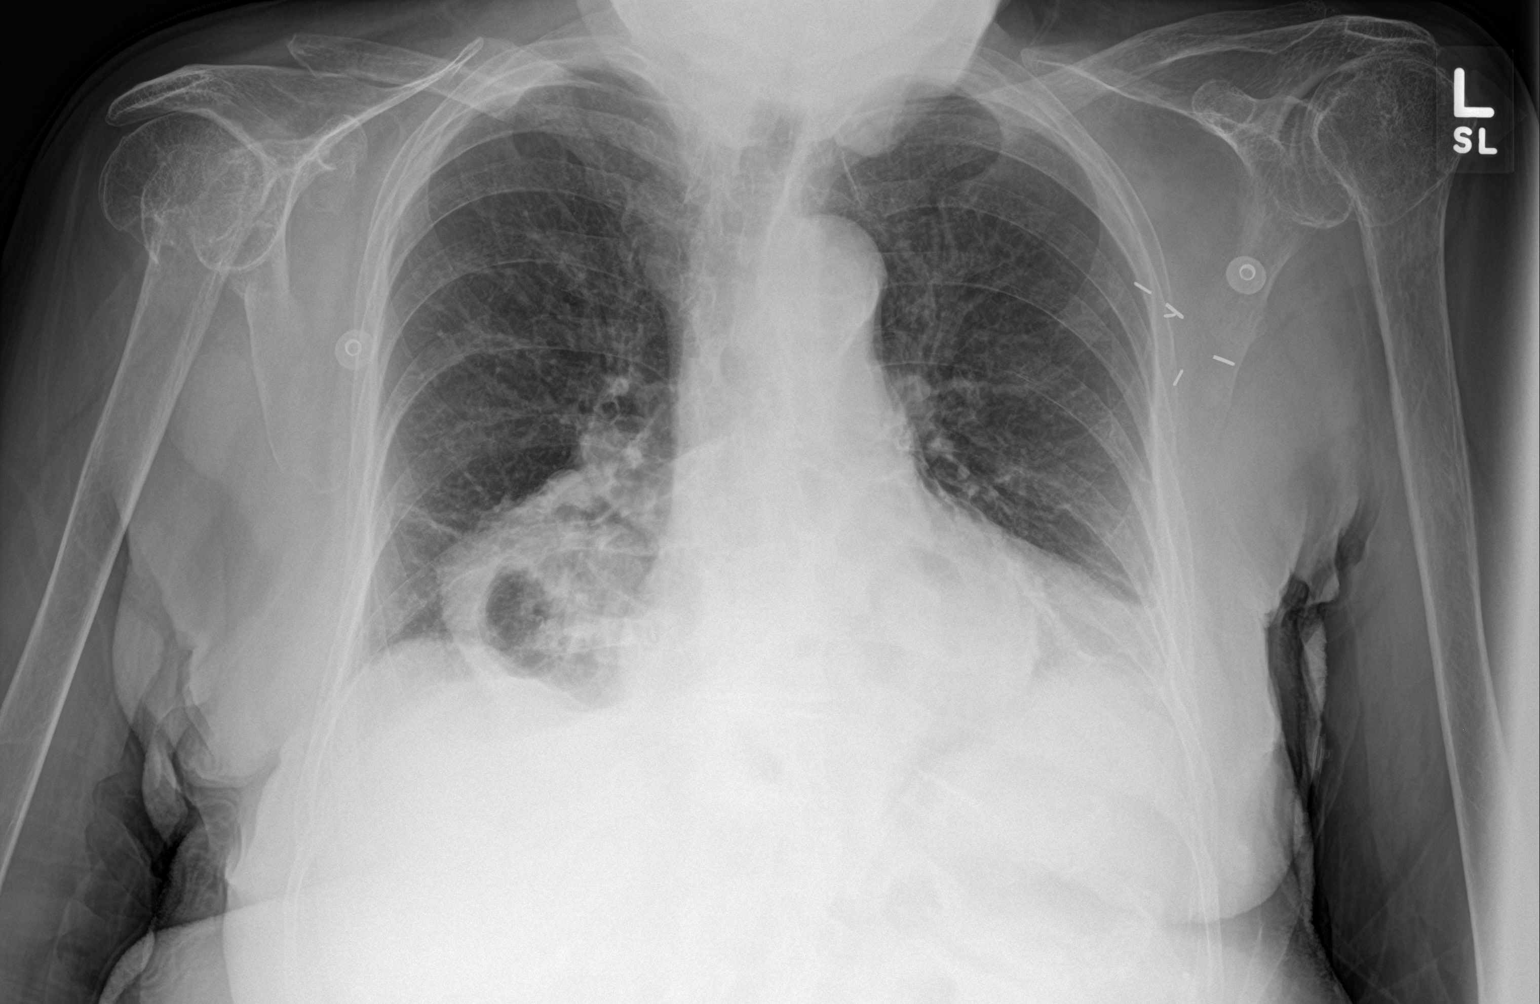

[2 of 2 positions shown; findings below may reference images not displayed]

FINDINGS: Mild cardiomegaly. Atheromatous calcifications of the arch of the
aorta. Large hiatal hernia extending to the right lung base. Minor
atelectasis at the bibasilar regions. There are some interstitial
changes seen as well. Osteopenia. There is some deformity seen at
the right humeral head.
IMPRESSION: No active cardiopulmonary disease.  Large hiatal hernia.

## 2022-11-14 IMAGING — US US RENAL
1 series · 14 of 21 positions shown · non-contrast
Comparison: None Available.

CLINICAL DATA: Acute kidney injury.

EXAM:
RENAL / URINARY TRACT ULTRASOUND COMPLETE

[Series 1: us renal · 14 of 21 slices shown]
[im 1/21]
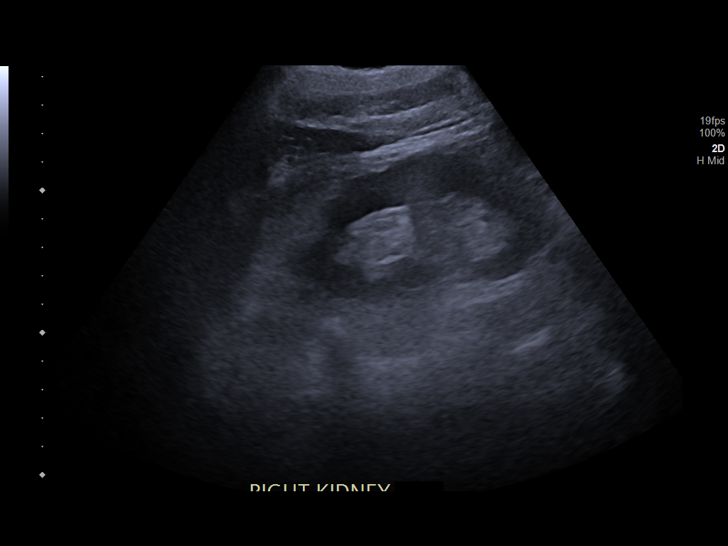
[im 3/21]
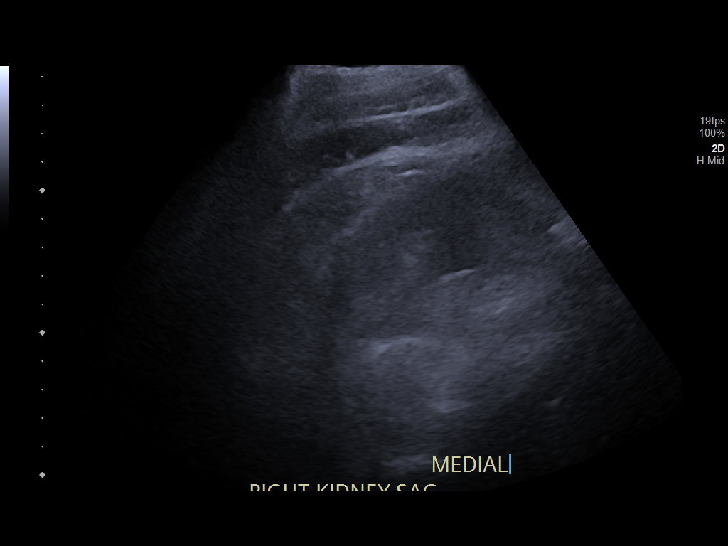
[im 4/21]
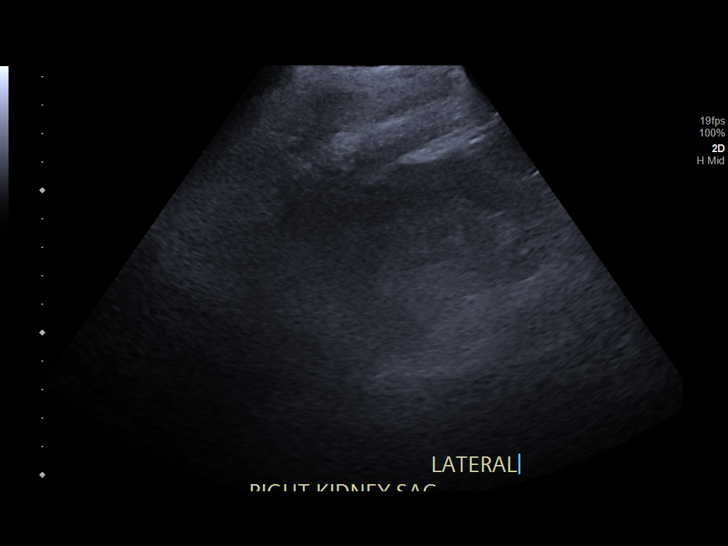
[im 6/21]
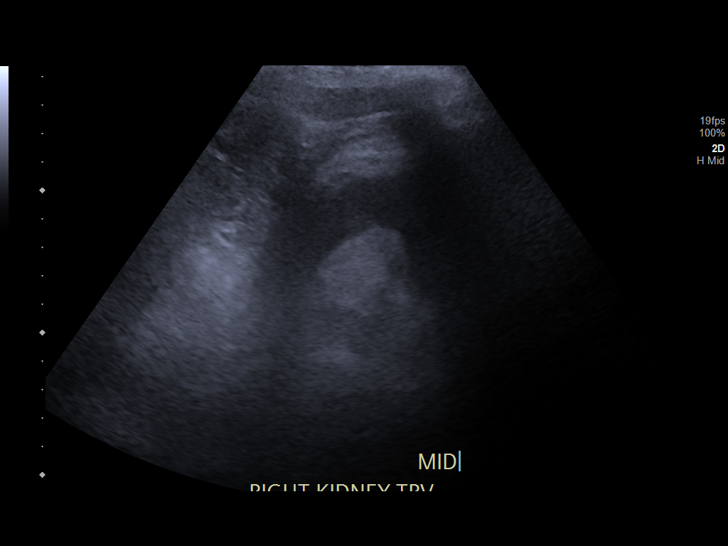
[im 7/21]
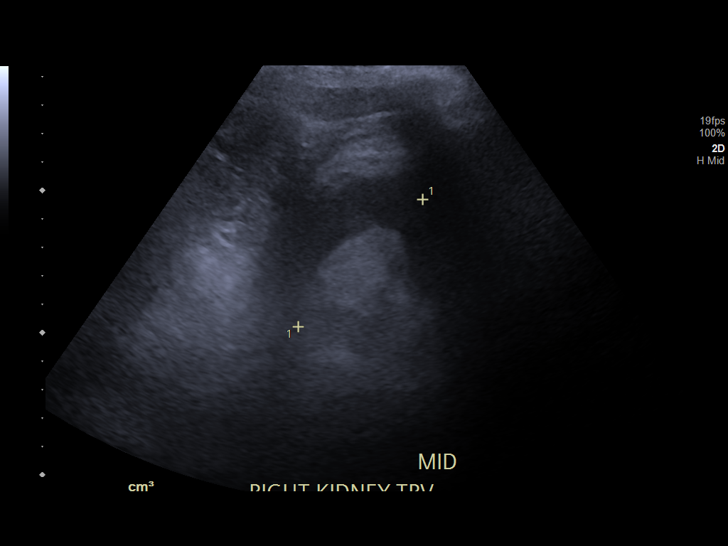
[im 9/21]
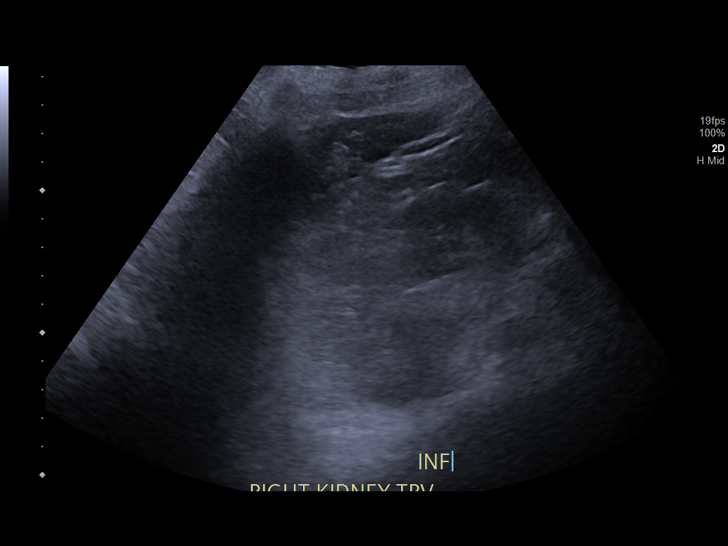
[im 10/21]
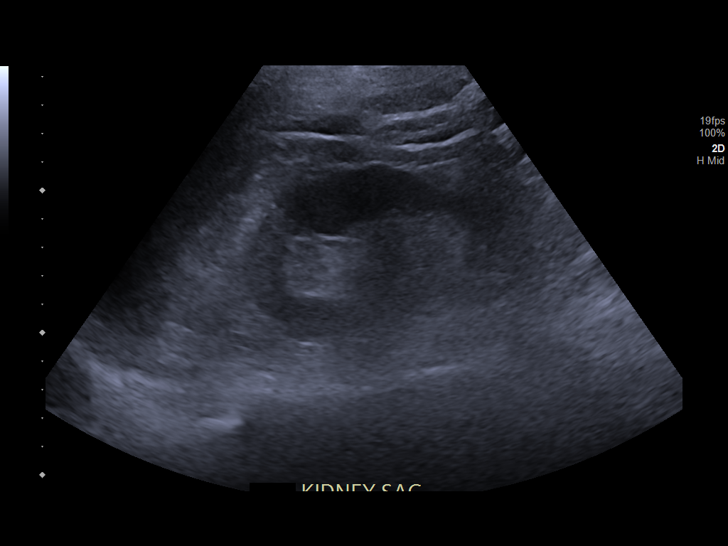
[im 12/21]
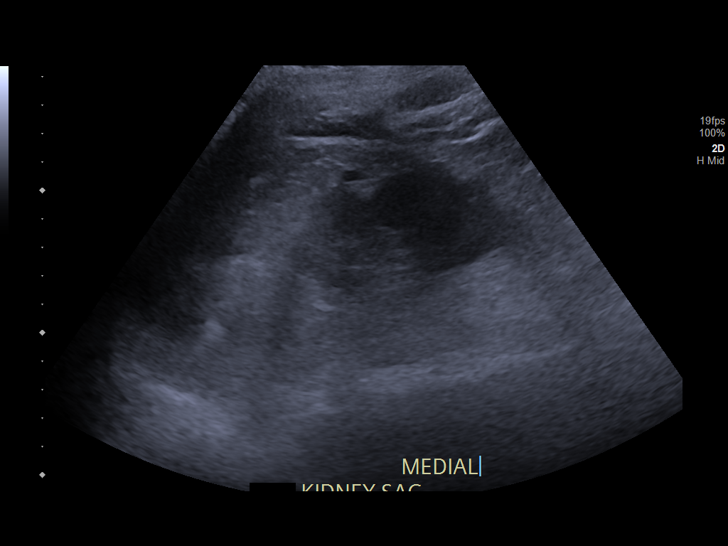
[im 13/21]
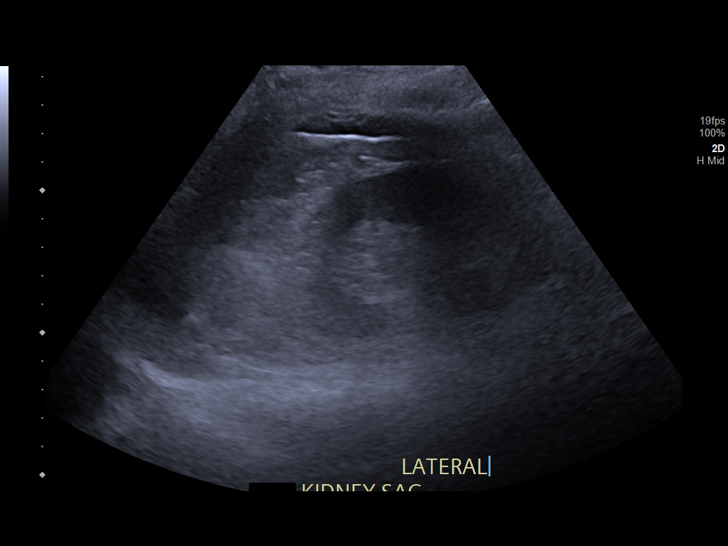
[im 15/21]
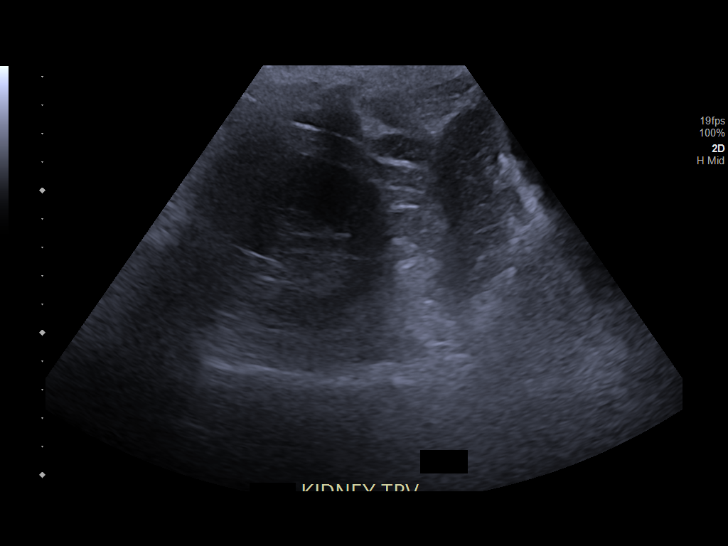
[im 16/21]
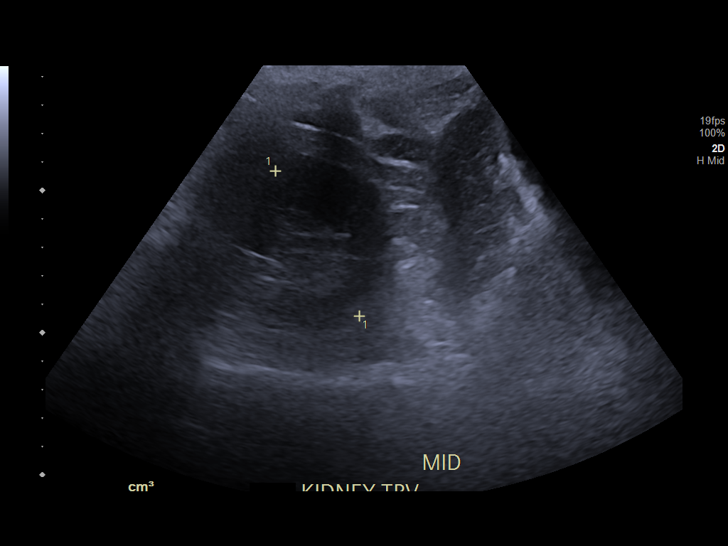
[im 18/21]
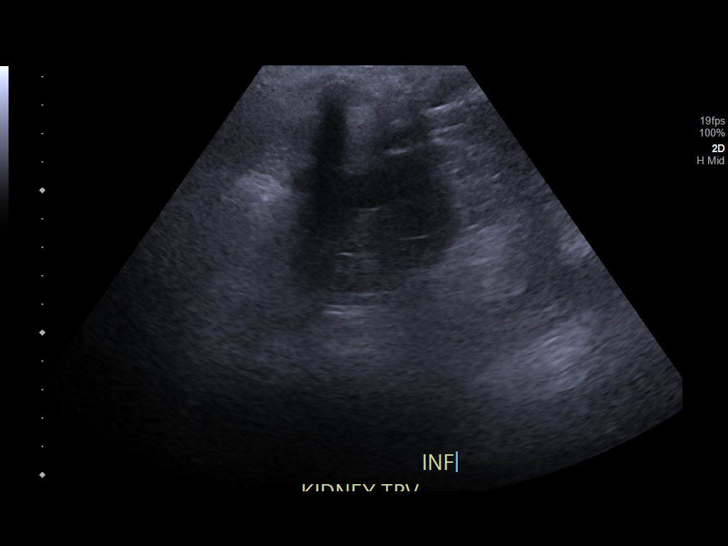
[im 19/21]
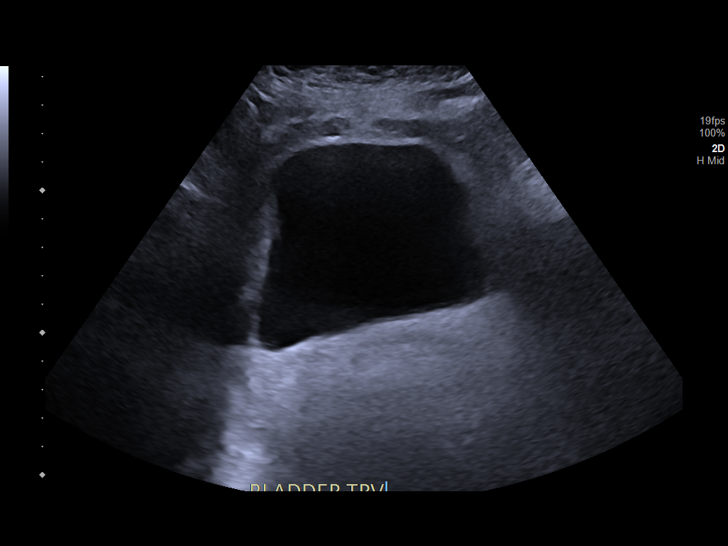
[im 21/21]
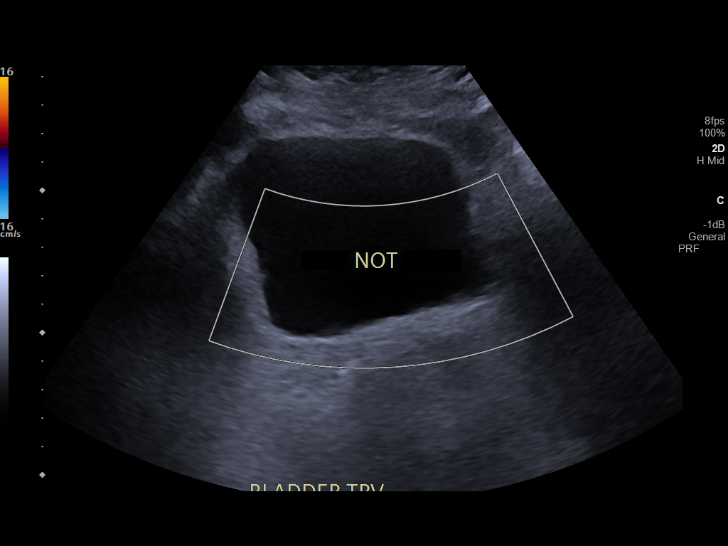

[14 of 21 positions shown; findings below may reference images not displayed]

FINDINGS: Right Kidney:

Renal measurements: 9.6 x 4.6 x 6.3 cm = volume: 146 mL.
Echogenicity within normal limits. No mass or hydronephrosis
visualized.

Left Kidney:

Renal measurements: 9.7 x 5.1 x 5.9 cm = volume: 152 mL.
Echogenicity within normal limits. No mass or hydronephrosis
visualized.

Bladder:

Appears normal for degree of bladder distention.

Other:

None.
IMPRESSION: Normal renal ultrasound.

## 2023-03-31 DIAGNOSIS — Z0000 Encounter for general adult medical examination without abnormal findings: Secondary | ICD-10-CM | POA: Diagnosis not present

## 2023-03-31 DIAGNOSIS — D510 Vitamin B12 deficiency anemia due to intrinsic factor deficiency: Secondary | ICD-10-CM | POA: Diagnosis not present

## 2023-03-31 DIAGNOSIS — J449 Chronic obstructive pulmonary disease, unspecified: Secondary | ICD-10-CM | POA: Diagnosis not present

## 2023-03-31 DIAGNOSIS — M810 Age-related osteoporosis without current pathological fracture: Secondary | ICD-10-CM | POA: Diagnosis not present

## 2023-03-31 DIAGNOSIS — E039 Hypothyroidism, unspecified: Secondary | ICD-10-CM | POA: Diagnosis not present

## 2023-03-31 DIAGNOSIS — Z23 Encounter for immunization: Secondary | ICD-10-CM | POA: Diagnosis not present

## 2023-03-31 DIAGNOSIS — I110 Hypertensive heart disease with heart failure: Secondary | ICD-10-CM | POA: Diagnosis not present

## 2023-03-31 DIAGNOSIS — E782 Mixed hyperlipidemia: Secondary | ICD-10-CM | POA: Diagnosis not present

## 2023-03-31 DIAGNOSIS — Z853 Personal history of malignant neoplasm of breast: Secondary | ICD-10-CM | POA: Diagnosis not present

## 2023-03-31 DIAGNOSIS — I700 Atherosclerosis of aorta: Secondary | ICD-10-CM | POA: Diagnosis not present

## 2023-03-31 DIAGNOSIS — I5030 Unspecified diastolic (congestive) heart failure: Secondary | ICD-10-CM | POA: Diagnosis not present

## 2023-03-31 DIAGNOSIS — I10 Essential (primary) hypertension: Secondary | ICD-10-CM | POA: Diagnosis not present

## 2023-06-02 ENCOUNTER — Ambulatory Visit: Payer: Medicare Other | Admitting: Podiatry

## 2023-06-02 DIAGNOSIS — M2042 Other hammer toe(s) (acquired), left foot: Secondary | ICD-10-CM

## 2023-06-02 DIAGNOSIS — M79674 Pain in right toe(s): Secondary | ICD-10-CM

## 2023-06-02 DIAGNOSIS — B351 Tinea unguium: Secondary | ICD-10-CM

## 2023-06-02 DIAGNOSIS — M2041 Other hammer toe(s) (acquired), right foot: Secondary | ICD-10-CM

## 2023-06-02 DIAGNOSIS — I739 Peripheral vascular disease, unspecified: Secondary | ICD-10-CM

## 2023-06-02 DIAGNOSIS — M79675 Pain in left toe(s): Secondary | ICD-10-CM

## 2023-06-02 NOTE — Progress Notes (Signed)
       Subjective:  Patient ID: [name], female    DOB: [birth year],  MRN: [id number]  [name] presents to clinic today for:  Chief Complaint  Patient presents with   Nail Problem    RFC   Patient notes nails are thick, discolored, elongated and painful in shoegear when trying to ambulate.  Her husband has been trying to trim her nails but states it has been pretty difficult.  The right second toenail is the most problematic noting that it is starting to grow into the adjacent great toe  PCP is Georgann Housekeeper, MD. date last seen was 05/11/2023  Allergies  Allergen Reactions   Codeine Anaphylaxis    Other reaction(s): Unknown   Review of Systems: Negative except as noted in the HPI.  Objective:  There were no vitals filed for this visit.  [name] is a pleasant 82 y.o. female in NAD. AAO x 3.  Vascular Examination: Capillary refill time is 3-5 seconds to toes bilateral.  Trace palpable pedal pulses b/l LE. Digital hair sparse b/l. No pedal edema b/l. Skin temperature gradient WNL b/l.  Mild telangiectasias bilateral legs and ankles.  No cyanosis or clubbing noted b/l.   Dermatological Examination: Pedal skin with normal turgor, texture and tone b/l. No open wounds. No interdigital macerations b/l. Toenails x10 are 5-90mm thick, discolored, dystrophic with subungual debris. There is pain with compression of the nail plates.  They are severely elongated x10.  The right second toenail has curled down and circle back upon itself medially and pushing against the hallux  Neurological Examination: Protective sensation intact bilateral LE. Vibratory sensation intact bilateral LE.  Musculoskeletal exam: There are semirigid contractures of the lesser toes bilateral.  Assessment/Plan: 1. Pain due to onychomycosis of toenails of both feet   2. PVD (peripheral vascular disease) (HCC)   3. Hammertoe of left foot   4. Hammertoe of right foot     The mycotic toenails  were sharply debrided x10 with sterile nail nippers and a power debriding burr to decrease bulk/thickness and length.    The patient was asking what she could do to treat her nail fungus.  Our office is currently out of stock of formula 7, so recommended the Tolcylen solution.  However this is too expensive for the patient.  Will need to notify the patient when the formula 7 comes in which is more affordable for the patient.  She can put this on every day for the next 6 to 8 months and we will recheck at her next visit in about 3 months.  Return in about 3 months (around 09/02/2023) for RFC.   Clerance Lav, DPM, FACFAS [facility]     [address] [place], [place] [address]                Office [phone]  Fax [phone]

## 2023-09-08 ENCOUNTER — Encounter: Payer: Medicare Other | Admitting: Podiatry

## 2023-09-08 NOTE — Progress Notes (Signed)
Patient was a no-show for today's scheduled appointment.

## 2024-01-23 DIAGNOSIS — S60222A Contusion of left hand, initial encounter: Secondary | ICD-10-CM | POA: Diagnosis not present

## 2024-01-23 DIAGNOSIS — S8002XA Contusion of left knee, initial encounter: Secondary | ICD-10-CM | POA: Diagnosis not present

## 2024-01-23 DIAGNOSIS — W19XXXA Unspecified fall, initial encounter: Secondary | ICD-10-CM | POA: Diagnosis not present

## 2024-01-23 DIAGNOSIS — S62625A Displaced fracture of medial phalanx of left ring finger, initial encounter for closed fracture: Secondary | ICD-10-CM | POA: Diagnosis not present

## 2024-01-23 DIAGNOSIS — S62615A Displaced fracture of proximal phalanx of left ring finger, initial encounter for closed fracture: Secondary | ICD-10-CM | POA: Diagnosis not present

## 2024-01-25 DIAGNOSIS — S62608D Fracture of unspecified phalanx of other finger, subsequent encounter for fracture with routine healing: Secondary | ICD-10-CM | POA: Diagnosis not present

## 2024-05-01 DIAGNOSIS — I10 Essential (primary) hypertension: Secondary | ICD-10-CM | POA: Diagnosis not present

## 2024-05-01 DIAGNOSIS — Z0000 Encounter for general adult medical examination without abnormal findings: Secondary | ICD-10-CM | POA: Diagnosis not present

## 2024-05-01 DIAGNOSIS — I700 Atherosclerosis of aorta: Secondary | ICD-10-CM | POA: Diagnosis not present

## 2024-05-01 DIAGNOSIS — G4700 Insomnia, unspecified: Secondary | ICD-10-CM | POA: Diagnosis not present

## 2024-05-01 DIAGNOSIS — M810 Age-related osteoporosis without current pathological fracture: Secondary | ICD-10-CM | POA: Diagnosis not present

## 2024-05-01 DIAGNOSIS — E782 Mixed hyperlipidemia: Secondary | ICD-10-CM | POA: Diagnosis not present

## 2024-05-01 DIAGNOSIS — E039 Hypothyroidism, unspecified: Secondary | ICD-10-CM | POA: Diagnosis not present

## 2024-05-01 DIAGNOSIS — Z853 Personal history of malignant neoplasm of breast: Secondary | ICD-10-CM | POA: Diagnosis not present

## 2024-05-01 DIAGNOSIS — D510 Vitamin B12 deficiency anemia due to intrinsic factor deficiency: Secondary | ICD-10-CM | POA: Diagnosis not present

## 2024-05-01 DIAGNOSIS — G301 Alzheimer's disease with late onset: Secondary | ICD-10-CM | POA: Diagnosis not present

## 2024-05-01 DIAGNOSIS — I5030 Unspecified diastolic (congestive) heart failure: Secondary | ICD-10-CM | POA: Diagnosis not present

## 2024-05-01 DIAGNOSIS — J449 Chronic obstructive pulmonary disease, unspecified: Secondary | ICD-10-CM | POA: Diagnosis not present
